# Patient Record
Sex: Female | Born: 1960
Health system: Southern US, Community
[De-identification: ages and names within clinical notes are randomized; demographics above are authoritative.]

## PROBLEM LIST (undated history)

## (undated) DIAGNOSIS — M1990 Unspecified osteoarthritis, unspecified site: Secondary | ICD-10-CM

## (undated) DIAGNOSIS — K219 Gastro-esophageal reflux disease without esophagitis: Secondary | ICD-10-CM

## (undated) DIAGNOSIS — Z87442 Personal history of urinary calculi: Secondary | ICD-10-CM

## (undated) DIAGNOSIS — K2210 Ulcer of esophagus without bleeding: Secondary | ICD-10-CM

## (undated) DIAGNOSIS — R519 Headache, unspecified: Secondary | ICD-10-CM

## (undated) DIAGNOSIS — K900 Celiac disease: Principal | ICD-10-CM

## (undated) HISTORY — PX: WISDOM TOOTH EXTRACTION: SHX21

## (undated) HISTORY — DX: Celiac disease: K90.0

## (undated) HISTORY — PX: TUBAL LIGATION: SHX77

---

## 2000-12-31 ENCOUNTER — Encounter (HOSPITAL_COMMUNITY): Admission: RE | Admit: 2000-12-31 | Discharge: 2001-01-30 | Payer: Self-pay | Admitting: Preventative Medicine

## 2001-02-26 ENCOUNTER — Ambulatory Visit (HOSPITAL_COMMUNITY): Admission: RE | Admit: 2001-02-26 | Discharge: 2001-02-26 | Payer: Self-pay | Admitting: Family Medicine

## 2001-02-26 ENCOUNTER — Encounter: Payer: Self-pay | Admitting: Family Medicine

## 2013-06-14 ENCOUNTER — Encounter (HOSPITAL_COMMUNITY)
Admission: EM | Disposition: A | Discharge status: Home / Self Care | Payer: Self-pay | Source: Home / Self Care | Attending: Family Medicine

## 2013-06-14 SURGERY — COLONOSCOPY, ESOPHAGOGASTRODUODENOSCOPY (EGD) AND ESOPHAGEAL DILATION (ED)
Anesthesia: Moderate Sedation

## 2013-07-02 ENCOUNTER — Ambulatory Visit (INDEPENDENT_AMBULATORY_CARE_PROVIDER_SITE_OTHER): Payer: No Typology Code available for payment source | Admitting: Family Medicine

## 2013-07-02 ENCOUNTER — Encounter: Payer: Self-pay | Admitting: Family Medicine

## 2013-07-02 VITALS — BP 138/90 | Ht 64.25 in | Wt 235.0 lb

## 2013-07-02 DIAGNOSIS — R079 Chest pain, unspecified: Secondary | ICD-10-CM

## 2013-07-02 DIAGNOSIS — Z205 Contact with and (suspected) exposure to viral hepatitis: Secondary | ICD-10-CM

## 2013-07-02 DIAGNOSIS — R5383 Other fatigue: Principal | ICD-10-CM

## 2013-07-02 DIAGNOSIS — Z0000 Encounter for general adult medical examination without abnormal findings: Secondary | ICD-10-CM

## 2013-07-02 DIAGNOSIS — K2100 Gastro-esophageal reflux disease with esophagitis, without bleeding: Secondary | ICD-10-CM

## 2013-07-02 DIAGNOSIS — Z20828 Contact with and (suspected) exposure to other viral communicable diseases: Secondary | ICD-10-CM

## 2013-07-02 DIAGNOSIS — R5381 Other malaise: Secondary | ICD-10-CM

## 2013-07-02 MED ORDER — PANTOPRAZOLE SODIUM 40 MG PO TBEC: 40.0000 mg | DELAYED_RELEASE_TABLET | Freq: Every morning | ORAL | Status: DC

## 2013-07-02 MED ORDER — SUCRALFATE 1 G PO TABS: ORAL_TABLET | ORAL | Status: DC

## 2013-07-02 NOTE — Progress Notes (Signed)
   Subjective:    Patient ID: Rhonda Harris, female    DOB: February 16, 1961, 53 y.o.   MRN: [id number]  HPIHaving pressure in middle of chest. Hurts to eat or drink. Takes antiacid meds.   For past two months, eating or dringking anything leads to chest discomfort  Substernal, last for a number om minutes  Bad pain, anacid tabs elp some  No vom No diarrhea  No energy, light headed when standing and then rising up suddenly  Exercise minimal  Works at [employer] active with this  No energy at all ,  Pain not associated with exertion  Patient's spouse has hepatitis C and the patient has not been tested for this.  She admits to not having seen a Dr. for nearly 10 years. Patient has not had a women's preventive physical for many years.      Dizziness in the am when standing.   Last prev p e,     Review of Systems  no weight loss gradual weight gain no significant headache no chest congestion no cough no change in bowel habits no blood in stool ROS otherwise negative    Objective:   Physical Exam Alert no apparent distress. Blood pressure 134/86 on repeat. HEENT normal. Lungs clear. Heart regular rate and rhythm. Epigastrium slight tenderness at most. No rebound no guarding no masses  Assessment #1 reflux with substernal discomfort immediately proceeding. #2 Gen. fatigue. #3 hepatitis C exposure with no known testing for this #4 patient behind on wellness recommendations plan initiate protonic some Carafate. Blood work is ordered. Followup in 2 weeks for reassessment. Strongly encourage preventive exam this spring      Assessmen assessment see above

## 2013-07-03 DIAGNOSIS — K2100 Gastro-esophageal reflux disease with esophagitis, without bleeding: Secondary | ICD-10-CM | POA: Insufficient documentation

## 2013-07-03 DIAGNOSIS — Z205 Contact with and (suspected) exposure to viral hepatitis: Secondary | ICD-10-CM | POA: Insufficient documentation

## 2013-07-06 ENCOUNTER — Encounter: Payer: Self-pay | Admitting: Family Medicine

## 2013-07-06 ENCOUNTER — Ambulatory Visit (INDEPENDENT_AMBULATORY_CARE_PROVIDER_SITE_OTHER): Payer: No Typology Code available for payment source | Admitting: Family Medicine

## 2013-07-06 VITALS — BP 118/70 | Temp 99.7°F | Ht 64.5 in | Wt 231.0 lb

## 2013-07-06 DIAGNOSIS — B349 Viral infection, unspecified: Secondary | ICD-10-CM

## 2013-07-06 DIAGNOSIS — B9789 Other viral agents as the cause of diseases classified elsewhere: Secondary | ICD-10-CM

## 2013-07-06 MED ORDER — ONDANSETRON 4 MG PO TBDP: 4.0000 mg | ORAL_TABLET | Freq: Three times a day (TID) | ORAL | Status: DC | PRN

## 2013-07-06 NOTE — Progress Notes (Signed)
   Subjective:    Patient ID: Rhonda Harris, female    DOB: [birth year], 53 y.o.   MRN: [id number]  Emesis  This is a new problem. The current episode started yesterday. Associated symptoms include diarrhea and a fever.   No cough Bad headache  Diminished energy and appetite  Drank gatorade and gingeral  High temp yest  Review of Systems  Constitutional: Positive for fever.  Gastrointestinal: Positive for vomiting and diarrhea.   No rash ROS otherwise negative    Objective:   Physical Exam Alert moderate malaise. Hydration good. HEENT normal. Neck supple lungs clear. Heart regular in rhythm. Abdomen hyperactive bowel sounds no discrete tenderness. Diffuse mild tenderness.       Assessment & Plan:  Impression viral gastroenteritis discussed plan Zofran when necessary. Symptomatic care discussed. Warning signs discussed. WSL

## 2013-07-10 ENCOUNTER — Telehealth: Payer: Self-pay | Admitting: Family Medicine

## 2013-07-10 ENCOUNTER — Inpatient Hospital Stay (HOSPITAL_COMMUNITY)
Admission: EM | Admit: 2013-07-10 | Discharge: 2013-07-13 | DRG: 812 | Disposition: A | Discharge status: Home / Self Care | Payer: No Typology Code available for payment source | Attending: Family Medicine | Admitting: Family Medicine

## 2013-07-10 ENCOUNTER — Emergency Department (HOSPITAL_COMMUNITY): Payer: No Typology Code available for payment source

## 2013-07-10 ENCOUNTER — Encounter (HOSPITAL_COMMUNITY): Payer: Self-pay | Admitting: Emergency Medicine

## 2013-07-10 DIAGNOSIS — K5730 Diverticulosis of large intestine without perforation or abscess without bleeding: Secondary | ICD-10-CM | POA: Diagnosis present

## 2013-07-10 DIAGNOSIS — R109 Unspecified abdominal pain: Secondary | ICD-10-CM | POA: Diagnosis present

## 2013-07-10 DIAGNOSIS — K2100 Gastro-esophageal reflux disease with esophagitis, without bleeding: Secondary | ICD-10-CM | POA: Diagnosis present

## 2013-07-10 DIAGNOSIS — R1310 Dysphagia, unspecified: Secondary | ICD-10-CM | POA: Diagnosis present

## 2013-07-10 DIAGNOSIS — Z87891 Personal history of nicotine dependence: Secondary | ICD-10-CM

## 2013-07-10 DIAGNOSIS — E876 Hypokalemia: Secondary | ICD-10-CM | POA: Diagnosis present

## 2013-07-10 DIAGNOSIS — D500 Iron deficiency anemia secondary to blood loss (chronic): Principal | ICD-10-CM | POA: Diagnosis present

## 2013-07-10 DIAGNOSIS — D649 Anemia, unspecified: Secondary | ICD-10-CM | POA: Diagnosis present

## 2013-07-10 DIAGNOSIS — D509 Iron deficiency anemia, unspecified: Secondary | ICD-10-CM | POA: Diagnosis present

## 2013-07-10 DIAGNOSIS — K449 Diaphragmatic hernia without obstruction or gangrene: Secondary | ICD-10-CM | POA: Diagnosis present

## 2013-07-10 DIAGNOSIS — K8020 Calculus of gallbladder without cholecystitis without obstruction: Secondary | ICD-10-CM | POA: Diagnosis present

## 2013-07-10 HISTORY — DX: Ulcer of esophagus without bleeding: K22.10

## 2013-07-10 HISTORY — DX: Gastro-esophageal reflux disease without esophagitis: K21.9

## 2013-07-10 LAB — HEPATIC FUNCTION PANEL
ALT: 12 U/L (ref 0–35)
AST: 20 U/L (ref 0–37)
Albumin: 3.6 g/dL (ref 3.5–5.2)
Alkaline Phosphatase: 49 U/L (ref 39–117)
Bilirubin, Direct: 0.1 mg/dL (ref 0.0–0.3)
Indirect Bilirubin: 0.3 mg/dL (ref 0.2–1.2)
Total Bilirubin: 0.4 mg/dL (ref 0.2–1.2)
Total Protein: 6.3 g/dL (ref 6.0–8.3)

## 2013-07-10 LAB — COMPREHENSIVE METABOLIC PANEL
ALBUMIN: 3.2 g/dL — AB (ref 3.5–5.2)
ALT: 14 U/L (ref 0–35)
AST: 21 U/L (ref 0–37)
Alkaline Phosphatase: 61 U/L (ref 39–117)
BUN: 10 mg/dL (ref 6–23)
CHLORIDE: 99 meq/L (ref 96–112)
CO2: 30 mEq/L (ref 19–32)
Calcium: 9.1 mg/dL (ref 8.4–10.5)
Creatinine, Ser: 0.74 mg/dL (ref 0.50–1.10)
GFR calc Af Amer: >90 mL/min (ref >90)
GFR calc non Af Amer: >90 mL/min (ref >90)
Glucose, Bld: 118 mg/dL — ABNORMAL HIGH (ref 70–99)
Potassium: 3.1 mEq/L — ABNORMAL LOW (ref 3.7–5.3)
Sodium: 139 mEq/L (ref 137–147)
Total Bilirubin: 0.2 mg/dL — ABNORMAL LOW (ref 0.3–1.2)
Total Protein: 7 g/dL (ref 6.0–8.3)

## 2013-07-10 LAB — BASIC METABOLIC PANEL
BUN: 11 mg/dL (ref 6–23)
CO2: 28 mEq/L (ref 19–32)
Calcium: 9 mg/dL (ref 8.4–10.5)
Chloride: 104 mEq/L (ref 96–112)
Creat: 0.65 mg/dL (ref 0.50–1.10)
Glucose, Bld: 99 mg/dL (ref 70–99)
Potassium: 4.1 mEq/L (ref 3.5–5.3)
Sodium: 140 mEq/L (ref 135–145)

## 2013-07-10 LAB — CBC WITH DIFFERENTIAL/PLATELET
BAND NEUTROPHILS: 0 % (ref 0–10)
BASOS PCT: 0 % (ref 0–1)
Basophils Absolute: 0.1 10*3/uL (ref 0.0–0.1)
Basophils Absolute: 0 10*3/uL (ref 0.0–0.1)
Basophils Relative: 1 % (ref 0–1)
Blasts: 0 %
Eosinophils Absolute: 0.1 10*3/uL (ref 0.0–0.7)
Eosinophils Absolute: 0 10*3/uL (ref 0.0–0.7)
Eosinophils Relative: 1 % (ref 0–5)
Eosinophils Relative: 0 % (ref 0–5)
HCT: 20.2 % — ABNORMAL LOW (ref 36.0–46.0)
HCT: 20.5 % — ABNORMAL LOW (ref 36.0–46.0)
Hemoglobin: 5.4 g/dL — CL (ref 12.0–15.0)
Hemoglobin: 5.5 g/dL — CL (ref 12.0–15.0)
Lymphocytes Relative: 35 % (ref 12–46)
Lymphocytes Relative: 45 % (ref 12–46)
Lymphs Abs: 2 10*3/uL (ref 0.7–4.0)
Lymphs Abs: 3 10*3/uL (ref 0.7–4.0)
MCH: 15.5 pg — ABNORMAL LOW (ref 26.0–34.0)
MCH: 16.3 pg — ABNORMAL LOW (ref 26.0–34.0)
MCHC: 26.7 g/dL — ABNORMAL LOW (ref 30.0–36.0)
MCHC: 26.8 g/dL — ABNORMAL LOW (ref 30.0–36.0)
MCV: 58 fL — ABNORMAL LOW (ref 78.0–100.0)
MCV: 60.8 fL — ABNORMAL LOW (ref 78.0–100.0)
MONO ABS: 0 10*3/uL — AB (ref 0.1–1.0)
Metamyelocytes Relative: 0 %
Monocytes Absolute: 0.5 10*3/uL (ref 0.1–1.0)
Monocytes Relative: 9 % (ref 3–12)
Monocytes Relative: 0 % — ABNORMAL LOW (ref 3–12)
Myelocytes: 0 %
Neutro Abs: 3 10*3/uL (ref 1.7–7.7)
Neutro Abs: 3.7 10*3/uL (ref 1.7–7.7)
Neutrophils Relative %: 54 % (ref 43–77)
Neutrophils Relative %: 55 % (ref 43–77)
Platelets: 337 10*3/uL (ref 150–400)
Platelets: 314 10*3/uL (ref 150–400)
Promyelocytes Absolute: 0 %
RBC: 3.48 MIL/uL — ABNORMAL LOW (ref 3.87–5.11)
RBC: 3.37 MIL/uL — ABNORMAL LOW (ref 3.87–5.11)
RDW: 19.4 % — ABNORMAL HIGH (ref 11.5–15.5)
RDW: 19.7 % — ABNORMAL HIGH (ref 11.5–15.5)
WBC: 5.6 10*3/uL (ref 4.0–10.5)
WBC: 6.7 10*3/uL (ref 4.0–10.5)
nRBC: 0 /100 WBC

## 2013-07-10 LAB — RETICULOCYTES
RBC.: 3.37 MIL/uL — AB (ref 3.87–5.11)
Retic Count, Absolute: 64 10*3/uL (ref 19.0–186.0)
Retic Ct Pct: 1.9 % (ref 0.4–3.1)

## 2013-07-10 LAB — LIPASE, BLOOD: Lipase: 68 U/L — ABNORMAL HIGH (ref 11–59)

## 2013-07-10 LAB — LIPID PANEL
Cholesterol: 141 mg/dL (ref 0–200)
HDL: 35 mg/dL — ABNORMAL LOW (ref >39)
LDL Cholesterol: 85 mg/dL (ref 0–99)
Total CHOL/HDL Ratio: 4 Ratio
Triglycerides: 107 mg/dL (ref <150)
VLDL: 21 mg/dL (ref 0–40)

## 2013-07-10 LAB — PREPARE RBC (CROSSMATCH)

## 2013-07-10 LAB — TSH: TSH: 0.536 u[IU]/mL (ref 0.350–4.500)

## 2013-07-10 LAB — PROTIME-INR
INR: 0.92 (ref 0.00–1.49)
PROTHROMBIN TIME: 12.2 s (ref 11.6–15.2)

## 2013-07-10 LAB — APTT: APTT: 24 s (ref 24–37)

## 2013-07-10 LAB — HEPATITIS C ANTIBODY: HCV Ab: NEGATIVE

## 2013-07-10 LAB — ABO/RH: ABO/RH(D): A POS

## 2013-07-10 MED ORDER — SODIUM CHLORIDE 0.9 % IV SOLN: Freq: Once | INTRAVENOUS | Status: DC

## 2013-07-10 NOTE — Telephone Encounter (Signed)
Received call from lab of hgb 5.4. Reviewed chart and recent notes. Called pt. She confirmed GI sx noted in chart which have recently been getting worse (pain in chest, hurts to eat/drink, pain with swallowing). She also has been feeling weak and lightheaded on standing, also worsening recently. Explained I'm concerned about internal bleeding and strongly advised her to go to the ED immediately. Pt said she would go to [facility]. Called ED to let them know abt pt and spoke with charge nurse.

## 2013-07-10 NOTE — H&P (Signed)
[facility] History and Physical  Rhonda Harris YBO:[id number] DOB: 1960/11/07 DOA: 07/10/2013   PCP: Rubbie Battiest, MD  Specialists: None  Chief Complaint: Low hemoglobin  HPI: Rhonda Harris is a 53 y.o. female with no significant past medical history, who, over the past 1-2 months, has been noticing increasing fatigue. Shortness of breath with activity. Some episodes of dizziness at times without any syncopal episode. She has noticed some black stools on and off. Although she has taken Pepto-Bismol. She's had some chest pain on and off which was thought to be secondary to acid reflux. Denies any chest pain currently. She's had some difficulty swallowing. She has had acid reflux and regurgitation. Has lost about 5-6 pounds over the last couple of weeks. And has been having abdominal pain on and off for the last month and a half as well. Denies any vaginal bleeding or discharge. She went to her primary care physician's office last Friday with the symptoms. She was prescribed antacids, PPI and was asked to get blood work done. On Monday she developed gastroenteritis, which resolved and then she went for blood work today. She was found to have a hemoglobin of 5 and was called in to come to the emergency department. Denies any blood in the stools. Recently, urine at any time. Denies colonoscopies. During her visit with her primary care physician. She also raised concern about possible exposure to hepatitis C from spouse.  Home Medications: Prior to Admission medications   Medication Sig Start Date End Date Taking? Authorizing Provider  ondansetron (ZOFRAN ODT) 4 MG disintegrating tablet Take 1 tablet (4 mg total) by mouth every 8 (eight) hours as needed for nausea or vomiting. 07/06/13  Yes Mikey Kirschner, MD  pantoprazole (PROTONIX) 40 MG tablet Take 1 tablet (40 mg total) by mouth every morning. 07/02/13  Yes Mikey Kirschner, MD  sucralfate (CARAFATE) 1 G tablet Take 1 g by mouth 4 (four)  times daily -  with meals and at bedtime.   Yes Historical Provider, MD    Allergies: No Known Allergies  Past Medical History: Past Medical History  Diagnosis Date  . Esophageal erosions     History reviewed. No pertinent past surgical history.  Social History: She lives in [place] with her husband. She works in the [employer]. No smoking, alcohol or illicit drug use. Usually independent with daily activities.  Family History: Denies any health problems in the family  Review of Systems - History obtained from the patient General ROS: positive for  - fatigue Psychological ROS: negative Ophthalmic ROS: negative ENT ROS: negative Allergy and Immunology ROS: negative Hematological and Lymphatic ROS: negative Endocrine ROS: negative Respiratory ROS: as in hpi Cardiovascular ROS: as in hpi Gastrointestinal ROS: no abdominal pain, change in bowel habits, or black or bloody stools Genito-Urinary ROS: no dysuria, trouble voiding, or hematuria Musculoskeletal ROS: negative Neurological ROS: no TIA or stroke symptoms Dermatological ROS: negative  Physical Examination  Filed Vitals:   07/10/13 2050  BP: 152/73  Pulse: 95  Temp: 98.6 F (37 C)  TempSrc: Oral  Resp: 16  Height: 5' 5"  (1.651 m)  Weight: 104.327 kg (230 lb)  SpO2: 100%    BP 152/73  Pulse 95  Temp(Src) 98.6 F (37 C) (Oral)  Resp 16  Ht 5' 5"  (1.651 m)  Wt 104.327 kg (230 lb)  BMI 38.27 kg/m2  SpO2 100%  General appearance: alert, cooperative, appears stated age, no distress and moderately obese Head: Normocephalic, without  obvious abnormality, atraumatic Eyes: conjunctivae/corneas clear. PERRL, EOM's intact. Throat: lips, mucosa, and tongue normal; teeth and gums normal Neck: no adenopathy, no carotid bruit, no JVD, supple, symmetrical, trachea midline and thyroid not enlarged, symmetric, no tenderness/mass/nodules Resp: clear to auscultation bilaterally Cardio: S1, S2 normal, no S3 or S4,  systolic murmur: early systolic 2/6, crescendo throughout the precordium, no click and no rub GI: Abdomen is soft. There is tenderness diffusely mostly in the upper abdomen without any rebound, rigidity, or guarding. No masses, organomegaly. Bowel sounds are present. Extremities: extremities normal, atraumatic, no cyanosis or edema Pulses: 2+ and symmetric Skin: Skin color, texture, turgor normal. No rashes or lesions Lymph nodes: Cervical, supraclavicular, and axillary nodes normal. Neurologic: Alert and oriented x3. No focal neurological deficits are present.  Laboratory Data: Results for orders placed during the hospital encounter of 07/10/13 (from the past 48 hour(s))  ABO/RH     Status: None   Collection Time    07/10/13  9:30 PM      Result Value Ref Range   ABO/RH(D) A POS    CBC WITH DIFFERENTIAL     Status: Abnormal   Collection Time    07/10/13  9:35 PM      Result Value Ref Range   WBC 6.7  4.0 - 10.5 K/uL   RBC 3.37 (*) 3.87 - 5.11 MIL/uL   Hemoglobin 5.5 (*) 12.0 - 15.0 g/dL   Comment: CRITICAL RESULT CALLED TO, READ BACK BY AND VERIFIED WITH:     G.PRUITT AT 2202 ON 07/10/13 BY S.VANHOORNE     RESULT REPEATED AND VERIFIED   HCT 20.5 (*) 36.0 - 46.0 %   MCV 60.8 (*) 78.0 - 100.0 fL   MCH 16.3 (*) 26.0 - 34.0 pg   MCHC 26.8 (*) 30.0 - 36.0 g/dL   RDW 19.7 (*) 11.5 - 15.5 %   Platelets 314  150 - 400 K/uL   Neutrophils Relative % 55  43 - 77 %   Lymphocytes Relative 45  12 - 46 %   Monocytes Relative 0 (*) 3 - 12 %   Eosinophils Relative 0  0 - 5 %   Basophils Relative 0  0 - 1 %   Band Neutrophils 0  0 - 10 %   Metamyelocytes Relative 0     Myelocytes 0     Promyelocytes Absolute 0     Blasts 0     nRBC 0  0 /100 WBC   Neutro Abs 3.7  1.7 - 7.7 K/uL   Lymphs Abs 3.0  0.7 - 4.0 K/uL   Monocytes Absolute 0.0 (*) 0.1 - 1.0 K/uL   Eosinophils Absolute 0.0  0.0 - 0.7 K/uL   Basophils Absolute 0.0  0.0 - 0.1 K/uL  COMPREHENSIVE METABOLIC PANEL     Status: Abnormal     Collection Time    07/10/13  9:35 PM      Result Value Ref Range   Sodium 139  137 - 147 mEq/L   Potassium 3.1 (*) 3.7 - 5.3 mEq/L   Chloride 99  96 - 112 mEq/L   CO2 30  19 - 32 mEq/L   Glucose, Bld 118 (*) 70 - 99 mg/dL   BUN 10  6 - 23 mg/dL   Creatinine, Ser 0.74  0.50 - 1.10 mg/dL   Calcium 9.1  8.4 - 10.5 mg/dL   Total Protein 7.0  6.0 - 8.3 g/dL   Albumin 3.2 (*) 3.5 - 5.2 g/dL  AST 21  0 - 37 U/L   ALT 14  0 - 35 U/L   Alkaline Phosphatase 61  39 - 117 U/L   Total Bilirubin 0.2 (*) 0.3 - 1.2 mg/dL   GFR calc non Af Amer >90  >90 mL/min   GFR calc Af Amer >90  >90 mL/min   Comment: (NOTE)     The eGFR has been calculated using the CKD EPI equation.     This calculation has not been validated in all clinical situations.     eGFR's persistently <90 mL/min signify possible Chronic Kidney     Disease.  TYPE AND SCREEN     Status: None   Collection Time    07/10/13  9:35 PM      Result Value Ref Range   ABO/RH(D) A POS     Antibody Screen NEG     Sample Expiration 07/13/2013     Unit Number [id number]     Blood Component Type RED CELLS,LR     Unit division 00     Status of Unit ALLOCATED     Transfusion Status OK TO TRANSFUSE     Crossmatch Result Compatible     Unit Number [id number]     Blood Component Type RED CELLS,LR     Unit division 00     Status of Unit ALLOCATED     Transfusion Status OK TO TRANSFUSE     Crossmatch Result Compatible    PROTIME-INR     Status: None   Collection Time    07/10/13  9:35 PM      Result Value Ref Range   Prothrombin Time 12.2  11.6 - 15.2 seconds   INR 0.92  0.00 - 1.49  APTT     Status: None   Collection Time    07/10/13  9:35 PM      Result Value Ref Range   aPTT 24  24 - 37 seconds  PREPARE RBC (CROSSMATCH)     Status: None   Collection Time    07/10/13  9:35 PM      Result Value Ref Range   Order Confirmation ORDER PROCESSED BY BLOOD BANK    RETICULOCYTES     Status: Abnormal   Collection Time     07/10/13  9:35 PM      Result Value Ref Range   Retic Ct Pct 1.9  0.4 - 3.1 %   RBC. 3.37 (*) 3.87 - 5.11 MIL/uL   Retic Count, Manual 64.0  19.0 - 186.0 K/uL  LIPASE, BLOOD     Status: Abnormal   Collection Time    07/10/13  9:35 PM      Result Value Ref Range   Lipase 68 (*) 11 - 59 U/L    Radiology Reports: No results found.  Electrocardiogram: Pending  Problem List  Principal Problem:   Microcytic anemia Active Problems:   Reflux esophagitis   Dysphagia   Abdominal pain   Assessment: This is a 53 year old, Caucasian female, does not have any significant medical problems, who presents with one to 2 month history of worsening dyspnea, chest pains, and acid reflux. She's found to have severe anemia. Hemoccult testing in the ED, was negative. But she does report black stools occasionally. It is very likely that her anemia is secondary to GI blood loss.  Plan: #1 microcytic anemia, possibly secondary to GI blood loss: She'll be transfused 2 units of blood. Hemoglobin will be repeated subsequently. Anemia panel has been sent  off and is pending. She does not report any vaginal bleeding.  #2 symptoms of dysphagia and acid reflux: It's quite likely patient has peptic ulcer disease. We will give her PPI. [facility] consult gastroenterology. Since she also has upper abdominal pain and tenderness. We will proceed with lipase levels, as well as a CT scan of the abdomen and pelvis. LFTs are normal. She will need upper endoscopy and will be kept NPO for now.  #3 hypokalemia: This will be repleted.  DVT Prophylaxis: SCDs Code Status: Full code Family Communication: Discussed with the patient and her husband  Disposition Plan: Admit to telemetry   Further management decisions will depend on results of further testing and patient's response to treatment.  [facility]  [facility] Pager [phone]  If 7PM-7AM, please contact night-coverage [email or web address] Password  [facility]  07/10/2013, 11:19 PM

## 2013-07-10 NOTE — ED Notes (Signed)
POC OCCULT BLOOD NEGATIVE

## 2013-07-10 NOTE — ED Notes (Addendum)
Had labwork drawn this morning and notified by Dr. Kathie Rhodes. [facility]'s office of low Hgb of 5.  Has noticed stools are tarry looking. Had stomach bug earlier part of week, did not notice hematemesis.

## 2013-07-11 ENCOUNTER — Encounter (HOSPITAL_COMMUNITY): Payer: Self-pay | Admitting: *Deleted

## 2013-07-11 DIAGNOSIS — K900 Celiac disease: Secondary | ICD-10-CM

## 2013-07-11 DIAGNOSIS — D649 Anemia, unspecified: Secondary | ICD-10-CM | POA: Diagnosis present

## 2013-07-11 DIAGNOSIS — D509 Iron deficiency anemia, unspecified: Secondary | ICD-10-CM

## 2013-07-11 DIAGNOSIS — R1310 Dysphagia, unspecified: Secondary | ICD-10-CM

## 2013-07-11 HISTORY — DX: Celiac disease: K90.0

## 2013-07-11 LAB — IRON AND TIBC
Iron: 13 ug/dL — ABNORMAL LOW (ref 42–135)
Saturation Ratios: 3 % — ABNORMAL LOW (ref 20–55)
TIBC: 487 ug/dL — ABNORMAL HIGH (ref 250–470)
UIBC: 474 ug/dL — AB (ref 125–400)

## 2013-07-11 LAB — COMPREHENSIVE METABOLIC PANEL
ALT: 13 U/L (ref 0–35)
AST: 19 U/L (ref 0–37)
Albumin: 3 g/dL — ABNORMAL LOW (ref 3.5–5.2)
Alkaline Phosphatase: 58 U/L (ref 39–117)
BILIRUBIN TOTAL: 0.5 mg/dL (ref 0.3–1.2)
BUN: 8 mg/dL (ref 6–23)
CALCIUM: 8.8 mg/dL (ref 8.4–10.5)
CO2: 27 mEq/L (ref 19–32)
CREATININE: 0.65 mg/dL (ref 0.50–1.10)
Chloride: 102 mEq/L (ref 96–112)
GFR calc Af Amer: >90 mL/min (ref >90)
GFR calc non Af Amer: >90 mL/min (ref >90)
Glucose, Bld: 98 mg/dL (ref 70–99)
Potassium: 4 mEq/L (ref 3.7–5.3)
Sodium: 138 mEq/L (ref 137–147)
Total Protein: 6.6 g/dL (ref 6.0–8.3)

## 2013-07-11 LAB — CBC
HEMATOCRIT: 25 % — AB (ref 36.0–46.0)
Hemoglobin: 7.4 g/dL — ABNORMAL LOW (ref 12.0–15.0)
MCH: 19.2 pg — ABNORMAL LOW (ref 26.0–34.0)
MCHC: 29.6 g/dL — ABNORMAL LOW (ref 30.0–36.0)
MCV: 64.9 fL — AB (ref 78.0–100.0)
PLATELETS: 254 10*3/uL (ref 150–400)
RBC: 3.85 MIL/uL — AB (ref 3.87–5.11)
RDW: 24.1 % — ABNORMAL HIGH (ref 11.5–15.5)
WBC: 6.5 10*3/uL (ref 4.0–10.5)

## 2013-07-11 LAB — VITAMIN B12: Vitamin B-12: 333 pg/mL (ref 211–911)

## 2013-07-11 LAB — FERRITIN: FERRITIN: 6 ng/mL — AB (ref 10–291)

## 2013-07-11 LAB — FOLATE: Folate: 12.2 ng/mL

## 2013-07-11 LAB — PREPARE RBC (CROSSMATCH)

## 2013-07-11 MED ORDER — IOHEXOL 300 MG/ML  SOLN
50.0000 mL | Freq: Once | INTRAMUSCULAR | Status: AC | PRN
  Administered 2013-07-11: 50 mL via ORAL

## 2013-07-11 MED ORDER — ACETAMINOPHEN 325 MG PO TABS
650.0000 mg | ORAL_TABLET | Freq: Four times a day (QID) | ORAL | Status: DC | PRN
  Administered 2013-07-11 – 2013-07-12 (×2): 650 mg via ORAL
  Filled 2013-07-11 (×2): qty 2

## 2013-07-11 MED ORDER — SODIUM CHLORIDE 0.9 % IV SOLN: INTRAVENOUS | Status: DC

## 2013-07-11 MED ORDER — POTASSIUM CHLORIDE 10 MEQ/100ML IV SOLN
10.0000 meq | INTRAVENOUS | Status: AC
  Administered 2013-07-11 (×4): 10 meq via INTRAVENOUS
  Filled 2013-07-11 (×4): qty 100

## 2013-07-11 MED ORDER — ONDANSETRON HCL 4 MG PO TABS: 4.0000 mg | ORAL_TABLET | Freq: Four times a day (QID) | ORAL | Status: DC | PRN

## 2013-07-11 MED ORDER — IOHEXOL 300 MG/ML  SOLN
100.0000 mL | Freq: Once | INTRAMUSCULAR | Status: AC | PRN
  Administered 2013-07-11: 100 mL via INTRAVENOUS

## 2013-07-11 MED ORDER — ALBUTEROL SULFATE (2.5 MG/3ML) 0.083% IN NEBU: 2.5000 mg | INHALATION_SOLUTION | RESPIRATORY_TRACT | Status: DC | PRN

## 2013-07-11 MED ORDER — ACETAMINOPHEN 650 MG RE SUPP: 650.0000 mg | Freq: Four times a day (QID) | RECTAL | Status: DC | PRN

## 2013-07-11 MED ORDER — SODIUM CHLORIDE 0.9 % IV SOLN
INTRAVENOUS | Status: DC
  Administered 2013-07-11 (×2): via INTRAVENOUS

## 2013-07-11 MED ORDER — ONDANSETRON HCL 4 MG/2ML IJ SOLN
4.0000 mg | Freq: Four times a day (QID) | INTRAMUSCULAR | Status: DC | PRN
Start: 2013-07-11 — End: 2013-07-13

## 2013-07-11 MED ORDER — PANTOPRAZOLE SODIUM 40 MG IV SOLR
40.0000 mg | Freq: Two times a day (BID) | INTRAVENOUS | Status: DC
  Administered 2013-07-11 – 2013-07-12 (×3): 40 mg via INTRAVENOUS
  Filled 2013-07-11 (×3): qty 40

## 2013-07-11 MED ORDER — SODIUM CHLORIDE 0.9 % IJ SOLN
3.0000 mL | Freq: Two times a day (BID) | INTRAMUSCULAR | Status: DC
  Administered 2013-07-11 – 2013-07-13 (×5): 3 mL via INTRAVENOUS

## 2013-07-11 NOTE — Telephone Encounter (Signed)
Thanks for heads-up and jumping on that!

## 2013-07-11 NOTE — Progress Notes (Signed)
Utilization review Completed Sukhdeep Wieting RN BSN   

## 2013-07-11 NOTE — Consult Note (Signed)
Referring Provider: No ref. provider found Primary Care Physician:  Harlow Asa, MD Primary Gastroenterologist:  Dr.Merel Santoli  Reason for Consultation:  Profound microcytic anemia requiring transfusion  HPI:   Pleasant 53 year old lady admitted to the hospital last evening after being found to have a profound microcytic anemia with a hemoglobin in the 5 range as an outpatient. Patient saw a physician for the first time in 12 years (Dr. Gerda Diss) last week. Complaining of progressive weakness over the past couple of weeks. Transient nausea, vomiting and nonbloody diarrhea the first of the week-felt to have had a viral syndrome. Also, was experiencing some vague chest discomfort with swallowing, actually some pain with swallowing. Denies dysphagia. Those symptoms have resolved (given Carafate and Protonix.). She was Hemoccult negative in the ED. She's been hemodynamically stable. She has not had any hematemesis, melena or hematochezia. She has received 2 units of packed blood cells overnight;  Follow up hemoglobin 7.4 this morning. Iron studies are pending. CT demonstrates hiatal hernia with some food debris in the hernia sac,  a single gallstone without evidence of cholecystitis and no other acute findings. Lipase minimally elevated at 68-pancreas appears normal on cross-sectional imaging. She has no abdominal pain this morning. No prior history of gastrointestinal illness. Specifically, she denies a history of peptic ulcer disease. No prior GI evaluation.  No prior EGD or colonoscopy. Patient denies alcohol or NSAID use. No family history of any GI malignancy.  Past Medical History  Diagnosis Date  . Esophageal erosions     History reviewed. No pertinent past surgical history.  Prior to Admission medications   Medication Sig Start Date End Date Taking? Authorizing Provider  ondansetron (ZOFRAN ODT) 4 MG disintegrating tablet Take 1 tablet (4 mg total) by mouth every 8 (eight) hours as needed for nausea or  vomiting. 07/06/13  Yes Merlyn Albert, MD  pantoprazole (PROTONIX) 40 MG tablet Take 1 tablet (40 mg total) by mouth every morning. 07/02/13  Yes Merlyn Albert, MD  sucralfate (CARAFATE) 1 G tablet Take 1 g by mouth 4 (four) times daily -  with meals and at bedtime.   Yes Historical Provider, MD    Current Facility-Administered Medications  Medication Dose Route Frequency Provider Last Rate Last Dose  . 0.9 %  sodium chloride infusion   Intravenous Continuous Osvaldo Shipper, MD 50 mL/hr at 07/11/13 [phone]    . 0.9 %  sodium chloride infusion   Intravenous Continuous Corbin Ade, MD      . acetaminophen (TYLENOL) tablet 650 mg  650 mg Oral Q6H PRN Osvaldo Shipper, MD       Or  . acetaminophen (TYLENOL) suppository 650 mg  650 mg Rectal Q6H PRN Osvaldo Shipper, MD      . albuterol (PROVENTIL) (2.5 MG/3ML) 0.083% nebulizer solution 2.5 mg  2.5 mg Nebulization Q2H PRN Osvaldo Shipper, MD      . ondansetron (ZOFRAN) tablet 4 mg  4 mg Oral Q6H PRN Osvaldo Shipper, MD       Or  . ondansetron (ZOFRAN) injection 4 mg  4 mg Intravenous Q6H PRN Osvaldo Shipper, MD      . pantoprazole (PROTONIX) injection 40 mg  40 mg Intravenous Q12H Osvaldo Shipper, MD   40 mg at 07/11/13 0909  . sodium chloride 0.9 % injection 3 mL  3 mL Intravenous Q12H Osvaldo Shipper, MD   3 mL at 07/11/13 0909    Allergies as of 07/10/2013  . (No Known Allergies)    History reviewed.  No pertinent family history.  History   Social History  . Marital Status: Single    Spouse Name: N/A    Number of Children: N/A  . Years of Education: N/A   Occupational History  . Not on file.   Social History Main Topics  . Smoking status: Former Games developer  . Smokeless tobacco: Former Neurosurgeon    Quit date: 07/02/1993  . Alcohol Use: No  . Drug Use: No  . Sexual Activity: Not on file   Other Topics Concern  . Not on file   Social History Narrative  . No narrative on file    Review of Systems: Gen: Denies any fever, chills,  sweats, anorexia,  weight loss, or sleep disorder CV: , angina, palpitations, syncope, orthopnea, PND, peripheral edema, and claudication. Resp: Denies  cough, sputum, wheezing, coughing up blood, and pleurisy. GI: Denies vomiting blood, jaundice, and fecal incontinence.   Derm: Denies rash, itching, dry skin, hives, moles, warts, or unhealing ulcers.    Physical Exam: Vital signs in last 24 hours: Temp:  [97.5 F (36.4 C)-98.6 F (37 C)] 97.9 F (36.6 C) (03/01 0635) Pulse Rate:  [77-95] 78 (03/01 0635) Resp:  [16-20] 20 (03/01 0635) BP: (108-152)/(63-83) 130/83 mmHg (03/01 0635) SpO2:  [96 %-100 %] 98 % (03/01 0635) Weight:  [230 lb (104.327 kg)-233 lb 14.4 oz (106.096 kg)] 233 lb 14.4 oz (106.096 kg) (03/01 0256) Last BM Date: 07/11/13 General:   Alert,  Well-developed, pleasant and cooperative in NAD;  accompanied  her sister Head:  Normocephalic and atraumatic. Eyes:  Sclera clear, no icterus.   Conjunctiva pale Nose:  No deformity, discharge,  or lesions. Mouth:  No deformity or lesions, dentition normal. Neck:  Supple; no masses or thyromegaly. Lungs:  Clear throughout to auscultation.   No wheezes, crackles, or rhonchi. No acute distress. Heart:  Regular rate and rhythm; no murmurs, clicks, rubs,  or gallops. Abdomen:  Nondistended. Positive bowel sounds. Soft and nontender without appreciable mass or organomegaly Rectal:  Deferred until time of colonoscopy.   Msk:  Symmetrical without gross deformities. Normal posture. Pulses:  Normal pulses noted. Extremities:  Without clubbing or edema. Neurologic:  Alert and  oriented x4;  grossly normal neurologically.  Intake/Output from previous day: 02/28 0701 - 03/01 0700 In: 1689.3 [Blood:1689.3] Out: -  Intake/Output this shift:    Lab Results:  Recent Labs  07/10/13 0919 07/10/13 2135 07/11/13 0739  WBC 5.6 6.7 6.5  HGB 5.4* 5.5* 7.4*  HCT 20.2* 20.5* 25.0*  PLT 337 314 254   BMET  Recent Labs   07/10/13 0919 07/10/13 2135 07/11/13 0739  NA 140 139 138  K 4.1 3.1* 4.0  CL 104 99 102  CO2 28 30 27   GLUCOSE 99 118* 98  BUN 11 10 8   CREATININE 0.65 0.74 1.88  CALCIUM 9.0 9.1 8.8   LFT  Recent Labs  07/10/13 0919  07/11/13 0739  PROT 6.3  < > 6.6  ALBUMIN 3.6  < > 3.0*  AST 20  < > 19  ALT 12  < > 13  ALKPHOS 49  < > 58  BILITOT 0.4  < > 0.5  BILIDIR 0.1  --   --   IBILI 0.3  --   --   < > = values in this interval not displayed. PT/INR  Recent Labs  07/10/13 2135  LABPROT 12.2  INR 0.92   Hepatitis Panel  Recent Labs  07/10/13 0919  HCVAB NEGATIVE  Studies/Results: Ct Abdomen Pelvis W Contrast  07/11/2013   CLINICAL DATA:  Abdominal pain, anemia  EXAM: CT ABDOMEN AND PELVIS WITH CONTRAST  TECHNIQUE: Multidetector CT imaging of the abdomen and pelvis was performed using the standard protocol following bolus administration of intravenous contrast.  CONTRAST:  OMNIPAQUE IOHEXOL 300 MG/ML  SOLN  COMPARISON:  None.  FINDINGS: Lung bases are clear.  Moderate hiatal hernia with fluid/debris.  Liver, spleen, pancreas, and adrenal glands are within normal limits.  Gallbladder is decompressed with a 12 mm gallstone (series 2/ image 29). No associated inflammatory changes by CT. No intrahepatic or extrahepatic ductal dilatation.  Kidneys are unremarkable.  No hydronephrosis.  No evidence of bowel obstruction.  Normal appendix.  No evidence of abdominal aortic aneurysm.  No abdominopelvic ascites.  No suspicious abdominopelvic lymphadenopathy.  Uterus and bilateral ovaries are unremarkable.  Bladder is within normal limits.  Degenerative changes of the visualized thoracolumbar spine.  IMPRESSION: No evidence of bowel obstruction.  Normal appendix.  Cholelithiasis, without associated inflammatory changes by CT.  Moderate hiatal hernia with fluid/debris.   Electronically Signed   By: Charline Bills M.D.   On: 07/11/2013 00:44    Impression:   Pleasant 52 year old lady  admitted to the hospital with a profound microcytic anemia requiring transfusion. Had transient nausea, vomiting diarrhea 1 week ago which was not most likely a self-limiting, separate process. Interestingly, had transient odynophagia which has resolved with a course of Carafate and Protonix. She really denies any chronic GI symptoms. She is Hemoccult negative on admission.  Iron studies are pending. Although no bleeding documented, need to consider occult lesions in her upper and lower GI tract which could produce slow GI bleeding. Poor iron absorption or primary hematological process also remains in the differential. Had minimally elevated lipase-I doubt pancreatitis. Cholelithiasis, appears asymptomatic.  Recommendations:  Follow hemoglobin. She may need an additional unit or 2 of packed red blood cells. I have offered the patient both a diagnostic EGD and colonoscopy tomorrow.The risks, benefits, limitations, imponderables and alternatives regarding both EGD and colonoscopy have been reviewed with the patient. Questions have been answered. All parties agreeable.  Review and studies as they become available. Further recommendations to follow.

## 2013-07-11 NOTE — Progress Notes (Signed)
[facility] PROGRESS NOTE  Rhonda Harris ZOX:[id number] DOB: 03-10-1961 DOA: 07/10/2013 PCP: Rhonda Asa, MD  Assessment/Plan: 1-Severe anemia;  microcytic anemia, possibly secondary to GI blood loss: Patient S/P  2 units of blood transfusion.  GI consulted. Probably need endoscopy/colonoscopy. -Hb increase to 7 from 5 on admission.  -Anemia panel pending.   2 Odynophagia, abdominal pain;  Continue with  PPI.  Gastroenterology consulted. CT scan of the abdomen and pelvis showed: No evidence of bowel obstruction. Normal appendix. Cholelithiasis, without associated inflammatory changes by CT. Moderate hiatal hernia with fluid/debris. -LFTs are normal.  -Lipase mildly elevated. Repeat in am.   3 hypokalemia: resolved.  4-Diarrhea; improved. Probably gastroenteritis.   Code Status: full Code.  Family Communication; care discussed with patient.  Disposition Plan: remain inpatient.    Consultants:  GI.   Procedures:  none  Antibiotics:  none  HPI/Subjective: Had 2 loose BM today. Relates pain on swallowing.  No blood in the stool today.   Objective: Filed Vitals:   07/11/13 0635  BP: 130/83  Pulse: 78  Temp: 97.9 F (36.6 C)  Resp: 20    Intake/Output Summary (Last 24 hours) at 07/11/13 1118 Last data filed at 07/11/13 0635  Gross per 24 hour  Intake 1689.33 ml  Output      0 ml  Net 1689.33 ml   Filed Weights   07/10/13 2050 07/11/13 0256  Weight: 104.327 kg (230 lb) 106.096 kg (233 lb 14.4 oz)    Exam:   General:  No distress.   Cardiovascular: S 1, S 2 RRR  Respiratory: CTA  Abdomen: Bs present, soft, NT  Musculoskeletal: no edema.   Data Reviewed: Basic Metabolic Panel:  Recent Labs Lab 07/10/13 0919 07/10/13 2135 07/11/13 0739  NA 140 139 138  K 4.1 3.1* 4.0  CL 104 99 102  CO2 28 30 27   GLUCOSE 99 118* 98  BUN 11 10 8   CREATININE 0.65 0.74 0.65  CALCIUM 9.0 9.1 8.8   Liver Function Tests:  Recent Labs Lab 07/10/13 0919  07/10/13 2135 07/11/13 0739  AST 20 21 19   ALT 12 14 13   ALKPHOS 49 61 58  BILITOT 0.4 0.2* 0.5  PROT 6.3 7.0 6.6  ALBUMIN 3.6 3.2* 3.0*    Recent Labs Lab 07/10/13 2135  LIPASE 68*   No results found for this basename: AMMONIA,  in the last 168 hours CBC:  Recent Labs Lab 07/10/13 0919 07/10/13 2135 07/11/13 0739  WBC 5.6 6.7 6.5  NEUTROABS 3.0 3.7  --   HGB 5.4* 5.5* 7.4*  HCT 20.2* 20.5* 25.0*  MCV 58.0* 60.8* 64.9*  PLT 337 314 254   Cardiac Enzymes: No results found for this basename: CKTOTAL, CKMB, CKMBINDEX, TROPONINI,  in the last 168 hours BNP (last 3 results) No results found for this basename: PROBNP,  in the last 8760 hours CBG: No results found for this basename: GLUCAP,  in the last 168 hours  No results found for this or any previous visit (from the past 240 hour(s)).   Studies: Ct Abdomen Pelvis W Contrast  07/11/2013   CLINICAL DATA:  Abdominal pain, anemia  EXAM: CT ABDOMEN AND PELVIS WITH CONTRAST  TECHNIQUE: Multidetector CT imaging of the abdomen and pelvis was performed using the standard protocol following bolus administration of intravenous contrast.  CONTRAST:  OMNIPAQUE IOHEXOL 300 MG/ML  SOLN  COMPARISON:  None.  FINDINGS: Lung bases are clear.  Moderate hiatal hernia with fluid/debris.  Liver, spleen, pancreas,  and adrenal glands are within normal limits.  Gallbladder is decompressed with a 12 mm gallstone (series 2/ image 29). No associated inflammatory changes by CT. No intrahepatic or extrahepatic ductal dilatation.  Kidneys are unremarkable.  No hydronephrosis.  No evidence of bowel obstruction.  Normal appendix.  No evidence of abdominal aortic aneurysm.  No abdominopelvic ascites.  No suspicious abdominopelvic lymphadenopathy.  Uterus and bilateral ovaries are unremarkable.  Bladder is within normal limits.  Degenerative changes of the visualized thoracolumbar spine.  IMPRESSION: No evidence of bowel obstruction.  Normal appendix.   Cholelithiasis, without associated inflammatory changes by CT.  Moderate hiatal hernia with fluid/debris.   Electronically Signed   By: Rhonda Harris M.D.   On: 07/11/2013 00:44    Scheduled Meds: . pantoprazole (PROTONIX) IV  40 mg Intravenous Q12H  . sodium chloride  3 mL Intravenous Q12H   Continuous Infusions: . sodium chloride 50 mL/hr at 07/11/13 0337  . sodium chloride      Principal Problem:   Microcytic anemia Active Problems:   Reflux esophagitis   Dysphagia   Abdominal pain   Anemia    Time spent: 35 minutes.     Rhonda Harris  [facility] Pager [phone]. If 7PM-7AM, please contact night-coverage at [email or web address], password [facility] 07/11/2013, 11:18 AM  LOS: 1 day

## 2013-07-12 ENCOUNTER — Encounter (HOSPITAL_COMMUNITY): Payer: Self-pay | Admitting: *Deleted

## 2013-07-12 ENCOUNTER — Encounter (HOSPITAL_COMMUNITY)
Admission: EM | Disposition: A | Discharge status: Home / Self Care | Payer: Self-pay | Source: Home / Self Care | Attending: Family Medicine

## 2013-07-12 DIAGNOSIS — K289 Gastrojejunal ulcer, unspecified as acute or chronic, without hemorrhage or perforation: Secondary | ICD-10-CM

## 2013-07-12 DIAGNOSIS — K6389 Other specified diseases of intestine: Secondary | ICD-10-CM

## 2013-07-12 DIAGNOSIS — D509 Iron deficiency anemia, unspecified: Secondary | ICD-10-CM

## 2013-07-12 HISTORY — PX: COLONOSCOPY WITH ESOPHAGOGASTRODUODENOSCOPY (EGD): SHX5779

## 2013-07-12 LAB — BASIC METABOLIC PANEL
BUN: 8 mg/dL (ref 6–23)
CALCIUM: 9.2 mg/dL (ref 8.4–10.5)
CO2: 27 mEq/L (ref 19–32)
Chloride: 105 mEq/L (ref 96–112)
Creatinine, Ser: 0.66 mg/dL (ref 0.50–1.10)
Glucose, Bld: 87 mg/dL (ref 70–99)
POTASSIUM: 3.9 meq/L (ref 3.7–5.3)
SODIUM: 141 meq/L (ref 137–147)

## 2013-07-12 LAB — CBC
HCT: 29.5 % — ABNORMAL LOW (ref 36.0–46.0)
Hemoglobin: 9.1 g/dL — ABNORMAL LOW (ref 12.0–15.0)
MCH: 20.6 pg — AB (ref 26.0–34.0)
MCHC: 30.8 g/dL (ref 30.0–36.0)
MCV: 66.7 fL — ABNORMAL LOW (ref 78.0–100.0)
Platelets: 272 10*3/uL (ref 150–400)
RBC: 4.42 MIL/uL (ref 3.87–5.11)
RDW: 23.5 % — ABNORMAL HIGH (ref 11.5–15.5)
WBC: 5.4 10*3/uL (ref 4.0–10.5)

## 2013-07-12 LAB — TYPE AND SCREEN
ABO/RH(D): A POS
Antibody Screen: NEGATIVE
Unit division: 0
Unit division: 0
Unit division: 0

## 2013-07-12 LAB — OCCULT BLOOD, POC DEVICE: Fecal Occult Bld: NEGATIVE

## 2013-07-12 SURGERY — COLONOSCOPY WITH ESOPHAGOGASTRODUODENOSCOPY (EGD)
Anesthesia: Moderate Sedation

## 2013-07-12 MED ORDER — MIDAZOLAM HCL 5 MG/5ML IJ SOLN
INTRAMUSCULAR | Status: DC | PRN
  Administered 2013-07-12 (×2): 2 mg via INTRAVENOUS
  Administered 2013-07-12 (×2): 1 mg via INTRAVENOUS

## 2013-07-12 MED ORDER — STERILE WATER FOR IRRIGATION IR SOLN
Status: DC | PRN
  Administered 2013-07-12: 14:00:00

## 2013-07-12 MED ORDER — ONDANSETRON HCL 4 MG/2ML IJ SOLN
INTRAMUSCULAR | Status: DC | PRN
  Administered 2013-07-12: 4 mg via INTRAVENOUS

## 2013-07-12 MED ORDER — PANTOPRAZOLE SODIUM 40 MG PO TBEC
40.0000 mg | DELAYED_RELEASE_TABLET | Freq: Every day | ORAL | Status: DC
  Administered 2013-07-13: 40 mg via ORAL
  Filled 2013-07-12: qty 1

## 2013-07-12 MED ORDER — MEPERIDINE HCL 100 MG/ML IJ SOLN
INTRAMUSCULAR | Status: AC
  Filled 2013-07-12: qty 2

## 2013-07-12 MED ORDER — PEG 3350-KCL-NA BICARB-NACL 420 G PO SOLR
4000.0000 mL | Freq: Once | ORAL | Status: AC
  Administered 2013-07-12: 4000 mL via ORAL
  Filled 2013-07-12: qty 4000

## 2013-07-12 MED ORDER — LIDOCAINE VISCOUS 2 % MT SOLN
OROMUCOSAL | Status: AC
  Filled 2013-07-12: qty 15

## 2013-07-12 MED ORDER — MIDAZOLAM HCL 5 MG/5ML IJ SOLN
INTRAMUSCULAR | Status: AC
  Filled 2013-07-12: qty 10

## 2013-07-12 MED ORDER — SODIUM CHLORIDE 0.9 % IV SOLN: INTRAVENOUS | Status: DC

## 2013-07-12 MED ORDER — LIDOCAINE VISCOUS 2 % MT SOLN
OROMUCOSAL | Status: DC | PRN
  Administered 2013-07-12: 1 via OROMUCOSAL

## 2013-07-12 MED ORDER — MEPERIDINE HCL 100 MG/ML IJ SOLN
INTRAMUSCULAR | Status: DC | PRN
  Administered 2013-07-12: 25 mg via INTRAVENOUS
  Administered 2013-07-12: 50 mg via INTRAVENOUS

## 2013-07-12 MED ORDER — ONDANSETRON HCL 4 MG/2ML IJ SOLN
INTRAMUSCULAR | Status: AC
  Filled 2013-07-12: qty 2

## 2013-07-12 NOTE — Progress Notes (Signed)
  PROGRESS NOTE  Rhonda Harris ZYY:[id number] DOB: [birth year] DOA: 07/10/2013 PCP: Rubbie Battiest, MD  Summary: 53 year old woman without significant medical history presenting with increasing shortness of breath, as is reflux. Found to have severe anemia.  Assessment/Plan: 1. Severe microcytic anemia without history of obvious bleeding. Status post upper and lower endoscopy today which revealed distal esophageal erosions, or erosive reflux esophagitis. Other findings as below. Status post 3 units packed red blood cells. 2. GERD. Stable.   Advance diet per GI. Daily PPI. Check CBC in the morning.  Discharge home on oral iron.   Likely home 3/3  Code Status: full code DVT prophylaxis: SCDs Family Communication:  Discussed with husband at bedside Disposition Plan:   [name], MD  [facility]  Pager [phone] If 7PM-7AM, please contact night-coverage at [email or web address], password [facility] 07/12/2013, 3:06 PM  LOS: 2 days   Consultants:  Gastroenterology  Procedures:  3/1 Transfusion 3 units packed red blood cells   3/2 Distal esophageal erosions consistent with mild erosive reflux esophagitis. Moderately large size hiatal hernia. Antral erosions - status post antral biopsy. Abnormal duodenal mucosa of uncertain significance A. status post biopsy to evaluate for celiac disease.  3/2 colonoscopy Ileal erosions of uncertain significance-status post biopsy. Colonic diverticulosis; otherwise, normal colonoscopy  HPI/Subjective: Feeling better today. No complaints.  Objective: Filed Vitals:   07/12/13 1435 07/12/13 1440 07/12/13 1445 07/12/13 1500  BP: 117/57 118/60  131/87  Pulse: 81 83 86 76  Temp:      TempSrc:      Resp: 16 17 17 17   Height:      Weight:      SpO2: 99% 99% 99% 99%    Intake/Output Summary (Last 24 hours) at 07/12/13 1506 Last data filed at 07/11/13 2148  Gross per 24 hour  Intake 1421.67 ml  Output      0 ml  Net 1421.67 ml     Filed  Weights   07/10/13 2050 07/11/13 0256  Weight: 104.327 kg (230 lb) 106.096 kg (233 lb 14.4 oz)    Exam:   Afebrile, vital signs stable  Gen. Appears calm and comfortable. Speech fluent and clear.  Cardiovascular regular rate and rhythm. No murmur, rub or gallop. No lower extremity edema.  Respiratory clear to auscultation bilaterally. No wheezes, rales or rhonchi. Normal respiratory effort.  Data Reviewed:  Hemoglobin 9.1 status post 3 units packed red blood cells  Scheduled Meds: . lidocaine      . meperidine      . midazolam      . ondansetron      . pantoprazole (PROTONIX) IV  40 mg Intravenous Q12H  . sodium chloride  3 mL Intravenous Q12H   Continuous Infusions: . sodium chloride 50 mL/hr at 07/11/13 2148    Principal Problem:   Microcytic anemia Active Problems:   Reflux esophagitis   Dysphagia   Abdominal pain   Anemia   Time spent 25 minutes

## 2013-07-12 NOTE — Op Note (Signed)
[facility] [address] [place], [address]   ENDOSCOPY PROCEDURE REPORT  PATIENT: [name], [name]  MR#: [id number] BIRTHDATE: 07-22-1960 , 52  yrs. old GENDER: Female ENDOSCOPIST: R.  Garfield Cornea, MD FACP [facility] REFERRED BY:  Rosemary Holms, M.D. PROCEDURE DATE:  07/12/2013 PROCEDURE:     EGD with gastric and duodenal biopsy  INDICATIONS:     Profound iron deficiency anemia requiring transfusion; recent self-limiting chest pain and odynophagia  INFORMED CONSENT:   The risks, benefits, limitations, alternatives and imponderables have been discussed.  The potential for biopsy, esophogeal dilation, etc. have also been reviewed.  Questions have been answered.  All parties agreeable.  Please see the history and physical in the medical record for more information.  MEDICATIONS:    Versed 5 mg IV and Demerol 75 mg IV in divided doses. Xylocaine gel for topical orao-pharyngeal anesthesia. Zofran 4 mg IV  DESCRIPTION OF PROCEDURE:   The EL-0761H ([id number])  endoscope was introduced through the mouth and advanced to the second portion of the duodenum without difficulty or limitations.  The mucosal surfaces were surveyed very carefully during advancement of the scope and upon withdrawal.  Retroflexion view of the proximal stomach and esophagogastric junction was performed.      FINDINGS: Couple of small distal esophageal erosions consistent with reflux esophagitis. No Barrett's esophagus. Stomach empty. Large (5-6 cm) hiatal hernia. Antral erosions. No ulcer or infiltrating process. Patent pylorus. Examination first, second and third portion duodenum obstructed some accentuated: "featerhing" of the mucosa. A couple of the mucosal fold tips had a scalloped appearance. This was a subtle finding.  THERAPEUTIC / DIAGNOSTIC MANEUVERS PERFORMED:  Biopsies of the abnormal antrum and duodenal mucosa taken for histologic study   COMPLICATIONS:  None  IMPRESSION:   Distal esophageal erosions consistent with mild erosive reflux esophagitis. Moderately large size hiatal hernia. Antral erosions - status post antral biopsy. Abnormal duodenal mucosa of uncertain significance A. status post biopsy to evaluate for celiac disease.  RECOMMENDATIONS:  Advance diet as tolerated. Daily PPI. Followup on pathology. See colonoscopy.    _______________________________ R. Garfield Cornea, MD FACP [facility] eSigned:  R. Garfield Cornea, MD FACP [facility] 07/12/2013 2:48 PM     CC:  PATIENT NAME:  Shamonique, Battiste MR#: [id number]

## 2013-07-12 NOTE — Op Note (Signed)
[facility] [address] [place], [address]   COLONOSCOPY PROCEDURE REPORT  PATIENT: Rhonda Harris, Rhonda Harris  MR#:         [id number] BIRTHDATE: Jan 27, 1961 , 52  yrs. old GENDER: Female ENDOSCOPIST: R.  Roetta Sessions, MD FACP [facility] REFERRED BY:  Simone Curia, M.D. PROCEDURE DATE:  07/12/2013 PROCEDURE:     ileocolonoscopy with ileal biopsy  INDICATIONS: Profound iron deficiency anemia; hemoglobin up to 9.1 after 3 unit transfusion of packed red blood cells  INFORMED CONSENT:  The risks, benefits, alternatives and imponderables including but not limited to bleeding, perforation as well as the possibility of a missed lesion have been reviewed.  The potential for biopsy, lesion removal, etc. have also been discussed.  Questions have been answered.  All parties agreeable. Please see the history and physical in the medical record for more information.  MEDICATIONS: Versed 6 mg IV and Demerol 75 mg IV in divided doses. Zofran 4 mg IV  DESCRIPTION OF PROCEDURE:  After a digital rectal exam was performed, the EG-2990i ([id number]) and EC-3890Li ([id number]) colonoscope was advanced from the anus through the rectum and colon to the area of the cecum, ileocecal valve and appendiceal orifice. The cecum was deeply intubated.  These structures were well-seen and photographed for the record.  From the level of the cecum and ileocecal valve, the scope was slowly and cautiously withdrawn. The mucosal surfaces were carefully surveyed utilizing scope tip deflection to facilitate fold flattening as needed.  The scope was pulled down into the rectum where a thorough examination including retroflexion was performed.    FINDINGS:  Adequate preparation. Normal rectum. Few scattered pancolonic diverticula; otherwise, the remainder of the colonic mucosa appeared normal. The distal 10 cm of terminal ileum was well seen. There were multiple erosions seen in the terminal ileum. No frank  ulcer.  THERAPEUTIC / DIAGNOSTIC MANEUVERS PERFORMED:  Biopsies of the abnormal-appearing terminal ileum taken for histologic study  COMPLICATIONS: none  CECAL WITHDRAWAL TIME:  13 minutes  IMPRESSION:  Ileal erosions of uncertain significance-status post biopsy. Colonic diverticulosis; otherwise, normal colonoscopy  RECOMMENDATIONS: Advance diet. Home on oral iron supplement. Daily PPI. Followup on biopsies. See EGD report.   _______________________________ eSigned:  R. Roetta Sessions, MD FACP [facility] 07/12/2013 2:56 PM   CC:    PATIENT NAME:  Lura, Falor MR#: [id number]

## 2013-07-13 ENCOUNTER — Encounter: Payer: Self-pay | Admitting: Gastroenterology

## 2013-07-13 ENCOUNTER — Telehealth: Payer: Self-pay | Admitting: Gastroenterology

## 2013-07-13 DIAGNOSIS — D509 Iron deficiency anemia, unspecified: Secondary | ICD-10-CM

## 2013-07-13 LAB — CBC
HEMATOCRIT: 29.5 % — AB (ref 36.0–46.0)
Hemoglobin: 8.8 g/dL — ABNORMAL LOW (ref 12.0–15.0)
MCH: 20.1 pg — ABNORMAL LOW (ref 26.0–34.0)
MCHC: 29.8 g/dL — ABNORMAL LOW (ref 30.0–36.0)
MCV: 67.5 fL — AB (ref 78.0–100.0)
Platelets: 282 10*3/uL (ref 150–400)
RBC: 4.37 MIL/uL (ref 3.87–5.11)
RDW: 24.5 % — ABNORMAL HIGH (ref 11.5–15.5)
WBC: 6.5 10*3/uL (ref 4.0–10.5)

## 2013-07-13 MED ORDER — FERROUS SULFATE 325 (65 FE) MG PO TABS: 325.0000 mg | ORAL_TABLET | Freq: Every day | ORAL | Status: DC

## 2013-07-13 NOTE — Telephone Encounter (Signed)
Please arrange hospital f/u for IDA in the next 2-4 weeks.

## 2013-07-13 NOTE — Discharge Summary (Signed)
Physician Discharge Summary  Rhonda Harris WSF:[id number] DOB: [birth year] DOA: 07/10/2013  PCP: Rubbie Battiest, MD  Admit date: 07/10/2013 Discharge date: 07/13/2013  Time spent: 35 minutes  Recommendations for Outpatient Follow-up:  1. Needs complete blood count and reticulocyte count in about a week 2. Started on oral iron this admission 3. Please followup on endoscopy/colonoscopy biopsy results performed 07/12/13   Discharge Diagnoses:  Principal Problem:   Microcytic anemia Active Problems:   Reflux esophagitis   Dysphagia   Abdominal pain   Anemia   Discharge Condition:  stable   Diet recommendation:  heart healthy   Filed Weights   07/10/13 2050 07/11/13 0256  Weight: 104.327 kg (230 lb) 106.096 kg (233 lb 14.4 oz)    History of present illness:   pleasant 54 year old Caucasian female admitted 07/02/13 with 2-3 months of dysphagia and then 3 weeks if dyspnea on exertion fatigue and chest discomfort associated with burning in the chest. Workup was performed in the outpatient setting by primary care physician who saw her on 2/20, 2/24 for initially reflux disease treated appropriately with Carafate, Protonix and then for potential viral diarrhea.  Lab work performed on second visit confirmed a low hemoglobin of 5.4   patient was in the hospital where she underwent further workup as below  Hospital Course:   1. Severe microcytic anemia without history of obvious bleeding. Status post upper and lower endoscopy 07/13/78 which revealed distal esophageal erosions, or erosive reflux esophagitis-there was a question raised of potential celiac disease. Other findings as below. Status post 3 units packed red blood cells this admission with no overt dizziness or weakness.  She will be started on iron on discharge and will need labs as above 2. GERD. Stable.   Procedures: COLONOSCOPY PROCEDURE REPORT  PATIENT: Rhonda, Harris MR#: [id number]  BIRTHDATE: 1960/12/25 , 52 yrs. old GENDER:  Female  ENDOSCOPIST: R. Garfield Cornea, MD FACP [facility]  REFERRED BY: Rosemary Holms, M.D.  PROCEDURE DATE: 07/12/2013  PROCEDURE: ileocolonoscopy with ileal biopsy  INDICATIONS: Profound iron deficiency anemia; hemoglobin up to 9.1  after 3 unit transfusion of packed red blood cells  INFORMED CONSENT: The risks, benefits, alternatives and  imponderables including but not limited to bleeding, perforation as  well as the possibility of a missed lesion have been reviewed. The  potential for biopsy, lesion removal, etc. have also been  discussed. Questions have been answered. All parties agreeable.  Please see the history and physical in the medical record for more  information.  MEDICATIONS: Versed 6 mg IV and Demerol 75 mg IV in divided doses.  Zofran 4 mg IV  DESCRIPTION OF PROCEDURE: After a digital rectal exam was  performed, the EG-2990i ([id number]) and EC-3890Li ([id number])  colonoscope was advanced from the anus through the rectum and colon  to the area of the cecum, ileocecal valve and appendiceal orifice.  The cecum was deeply intubated. These structures were well-seen  and photographed for the record. From the level of the cecum and  ileocecal valve, the scope was slowly and cautiously withdrawn.  The mucosal surfaces were carefully surveyed utilizing scope tip  deflection to facilitate fold flattening as needed. The scope was  pulled down into the rectum where a thorough examination including  retroflexion was performed.  FINDINGS: Adequate preparation. Normal rectum. Few scattered  pancolonic diverticula; otherwise, the remainder of the colonic  mucosa appeared normal. The distal 10 cm of terminal ileum was well  seen. There were multiple erosions seen in the terminal  ileum. No  frank ulcer.  THERAPEUTIC / DIAGNOSTIC MANEUVERS PERFORMED: Biopsies of the  abnormal-appearing terminal ileum taken for histologic study  COMPLICATIONS:none  CECAL WITHDRAWAL TIME: 13 minutes   IMPRESSION: Ileal erosions of uncertain significance-status post  biopsy. Colonic diverticulosis; otherwise, normal colonoscopy  RECOMMENDATIONS: Advance diet. Home on oral iron supplement. Daily  PPI. Followup on biopsies. See EGD report.  _______________________________  eSigned: R. Garfield Cornea, MD Rosalita Chessman [facility] 07/12/2013 2:56 PM    Consultations:   gastroenterology   Discharge Exam: Filed Vitals:   07/13/13 0500  BP: 119/67  Pulse: 71  Temp: 97.9 F (36.6 C)  Resp: 18    General:  alert pleasant oriented no apparent distress  Cardiovascular:  EOMI NCAT S1-S2 no murmur rub or gallop  Respiratory:  clear   Discharge Instructions  Discharge Orders   Future Appointments Provider Department Dept Phone   07/19/2013 9:00 AM Mikey Kirschner, MD [place] [phone]   08/02/2013 9:00 AM Nilda Simmer, NP [facility] [phone]   Future Orders Complete By Expires   Diet - low sodium heart healthy  As directed    Discharge instructions  As directed    Comments:     The reason for low blood count is unclear so far Recommend that you take oral iron for the next month to month and a half until we know a reason-this can cause a stool to get dark Please followup with her primary care physician for further preventive care including interval screening. I recommended that were done including CBC and reticulocyte count in about a week   Increase activity slowly  As directed        Medication List         ferrous sulfate 325 (65 FE) MG tablet  Commonly known as:  FERROUSUL  Take 1 tablet (325 mg total) by mouth daily with breakfast.     ondansetron 4 MG disintegrating tablet  Commonly known as:  ZOFRAN ODT  Take 1 tablet (4 mg total) by mouth every 8 (eight) hours as needed for nausea or vomiting.     pantoprazole 40 MG tablet  Commonly known as:  PROTONIX  Take 1 tablet (40 mg total) by mouth every morning.     sucralfate 1 G tablet   Commonly known as:  CARAFATE  Take 1 g by mouth 4 (four) times daily -  with meals and at bedtime.       No Known Allergies    The results of significant diagnostics from this hospitalization (including imaging, microbiology, ancillary and laboratory) are listed below for reference.    Significant Diagnostic Studies: Ct Abdomen Pelvis W Contrast  07/11/2013   CLINICAL DATA:  Abdominal pain, anemia  EXAM: CT ABDOMEN AND PELVIS WITH CONTRAST  TECHNIQUE: Multidetector CT imaging of the abdomen and pelvis was performed using the standard protocol following bolus administration of intravenous contrast.  CONTRAST:  115m OMNIPAQUE IOHEXOL 300 MG/ML  SOLN  COMPARISON:  None.  FINDINGS: Lung bases are clear.  Moderate hiatal hernia with fluid/debris.  Liver, spleen, pancreas, and adrenal glands are within normal limits.  Gallbladder is decompressed with a 12 mm gallstone (series 2/ image 29). No associated inflammatory changes by CT. No intrahepatic or extrahepatic ductal dilatation.  Kidneys are unremarkable.  No hydronephrosis.  No evidence of bowel obstruction.  Normal appendix.  No evidence of abdominal aortic aneurysm.  No abdominopelvic ascites.  No suspicious abdominopelvic lymphadenopathy.  Uterus and bilateral ovaries are unremarkable.  Bladder is within normal limits.  Degenerative changes of the visualized thoracolumbar spine.  IMPRESSION: No evidence of bowel obstruction.  Normal appendix.  Cholelithiasis, without associated inflammatory changes by CT.  Moderate hiatal hernia with fluid/debris.   Electronically Signed   By: Julian Hy M.D.   On: 07/11/2013 00:44    Microbiology: No results found for this or any previous visit (from the past 240 hour(s)).   Labs: Basic Metabolic Panel:  Recent Labs Lab 07/10/13 0919 07/10/13 2135 07/11/13 0739 07/12/13 0559  NA 140 139 138 141  K 4.1 3.1* 4.0 3.9  CL 104 99 102 105  CO2 28 30 27 27   GLUCOSE 99 118* 98 87  BUN 11 10 8 8    CREATININE 0.65 0.74 0.65 0.66  CALCIUM 9.0 9.1 8.8 9.2   Liver Function Tests:  Recent Labs Lab 07/10/13 0919 07/10/13 2135 07/11/13 0739  AST 20 21 19   ALT 12 14 13   ALKPHOS 49 61 58  BILITOT 0.4 0.2* 0.5  PROT 6.3 7.0 6.6  ALBUMIN 3.6 3.2* 3.0*    Recent Labs Lab 07/10/13 2135  LIPASE 68*   No results found for this basename: AMMONIA,  in the last 168 hours CBC:  Recent Labs Lab 07/10/13 0919 07/10/13 2135 07/11/13 0739 07/12/13 0559 07/13/13 0538  WBC 5.6 6.7 6.5 5.4 6.5  NEUTROABS 3.0 3.7  --   --   --   HGB 5.4* 5.5* 7.4* 9.1* 8.8*  HCT 20.2* 20.5* 25.0* 29.5* 29.5*  MCV 58.0* 60.8* 64.9* 66.7* 67.5*  PLT 337 314 254 272 282   Cardiac Enzymes: No results found for this basename: CKTOTAL, CKMB, CKMBINDEX, TROPONINI,  in the last 168 hours BNP: BNP (last 3 results) No results found for this basename: PROBNP,  in the last 8760 hours CBG: No results found for this basename: GLUCAP,  in the last 168 hours     Signed:  Nita Sells  [facility] 07/13/2013, 10:37 AM

## 2013-07-13 NOTE — ED Provider Notes (Signed)
Medical screening examination/treatment/procedure(s) were performed by non-physician practitioner and as supervising physician I was immediately available for consultation/collaboration.   EKG Interpretation   Date/Time:  Saturday July 10 2013 23:32:59 EST Ventricular Rate:  87 PR Interval:  176 QRS Duration: 100 QT Interval:  382 QTC Calculation: 459 R Axis:   48 Text Interpretation:  Normal sinus rhythm Nonspecific ST abnormality  Abnormal ECG No previous ECGs available ED PHYSICIAN INTERPRETATION  AVAILABLE IN [facility] Confirmed by TEST, Record ([id number]) on 07/12/2013  2:31:44 PM        Benny Lennert, MD 07/13/13 1453

## 2013-07-13 NOTE — Progress Notes (Signed)
    Subjective: No abdominal pain, N/V, no overt signs of GI bleeding. Tolerating diet.   Objective: Vital signs in last 24 hours: Temp:  [97.9 F (36.6 C)-98.1 F (36.7 C)] 97.9 F (36.6 C) (03/03 0500) Pulse Rate:  [71-94] 71 (03/03 0500) Resp:  [12-22] 18 (03/03 0500) BP: (105-138)/(55-117) 119/67 mmHg (03/03 0500) SpO2:  [91 %-99 %] 97 % (03/03 0500) FiO2 (%):  [90 %] 90 % (03/02 1405) Last BM Date: 07/12/13 General:   Alert and oriented, pleasant Heart:  S1, S2 present, no murmurs noted.  Lungs: Clear to auscultation bilaterally, without wheezing, rales, or rhonchi.  Abdomen:  Bowel sounds present, soft, non-tender, non-distended. No HSM or hernias noted. Extremities:  Without clubbing or edema.   Intake/Output from previous day: 03/02 0701 - 03/03 0700 In: 240 [P.O.:240] Out: 1150 [Urine:1150] Intake/Output this shift:    Lab Results:  Recent Labs  07/11/13 0739 07/12/13 0559 07/13/13 0538  WBC 6.5 5.4 6.5  HGB 7.4* 9.1* 8.8*  HCT 25.0* 29.5* 29.5*  PLT 254 272 282   BMET  Recent Labs  07/10/13 2135 07/11/13 0739 07/12/13 0559  NA 139 138 141  K 3.1* 4.0 3.9  CL 99 102 105  CO2 30 27 27   GLUCOSE 118* 98 87  BUN 10 8 8   CREATININE 0.74 0.65 0.66  CALCIUM 9.1 8.8 9.2   LFT  Recent Labs  07/10/13 0919 07/10/13 2135 07/11/13 0739  PROT 6.3 7.0 6.6  ALBUMIN 3.6 3.2* 3.0*  AST 20 21 19   ALT 12 14 13   ALKPHOS 49 61 58  BILITOT 0.4 0.2* 0.5  BILIDIR 0.1  --   --   IBILI 0.3  --   --    PT/INR  Recent Labs  07/10/13 2135  LABPROT 12.2  INR 0.92   Hepatitis Panel  Recent Labs  07/10/13 0919  HCVAB NEGATIVE    Assessment: 53 year old admitted with profound IDA, heme negative, with colonoscopy revealing ileal erosions, biopsy pending. EGD with erosive esophagitis, large hiatal hernia, antral erosions s/p biopsy. No evidence of overt GI bleeding; clinically stable. Appropriate for discharge with close outpatient follow-up.    Plan: PPI daily Follow-up on pending biopsies Oral iron supplementation as outpatient Will arrange follow-up in our office in about 2 weeks; anticipate discharge home today.   07/12/13, ANP-BC [facility]     LOS: 3 days    07/13/2013, 7:48 AM

## 2013-07-13 NOTE — Telephone Encounter (Signed)
Pt is aware of OV on 3/31 at 230 with AS and appt card was mailed

## 2013-07-13 NOTE — ED Provider Notes (Signed)
CSN: [id number]     Arrival date & time 07/10/13  2033 History   First MD Initiated Contact with Patient 07/10/13 2107     Chief Complaint  Patient presents with  . abnormal labs      (Consider location/radiation/quality/duration/timing/severity/associated sxs/prior Treatment) HPI Comments: She was seen recently by her doctor for symptoms of fatigue and generalized weakness. Blood studies were ordered but not done until today secondary to patient being ill with N, V, D for 3 days prior. She was contacted by her doctor's office to come to the ED for further evaluation of significantly low hemoglobin. She denies melena or bloody stools. She also denies hematemesis. No other medical history, no current medications. She reports not having been to the doctor's office for the past 12 years.   The history is provided by the patient. No language interpreter was used.    Past Medical History  Diagnosis Date  . Esophageal erosions   . GERD (gastroesophageal reflux disease)    Past Surgical History  Procedure Laterality Date  . Tubal ligation     History reviewed. No pertinent family history. History  Substance Use Topics  . Smoking status: Former Games developer  . Smokeless tobacco: Former Neurosurgeon    Quit date: 07/02/1993  . Alcohol Use: No   OB History   Grav Para Term Preterm Abortions TAB SAB Ect Mult Living                 Review of Systems  Constitutional: Positive for fatigue. Negative for fever and chills.  HENT: Negative.   Respiratory: Negative.   Cardiovascular: Negative.   Gastrointestinal: Negative.   Musculoskeletal: Negative.   Skin: Negative.   Neurological: Positive for weakness.      Allergies  Review of patient's allergies indicates no known allergies.  Home Medications   Current Outpatient Rx  Name  Route  Sig  Dispense  Refill  . ondansetron (ZOFRAN ODT) 4 MG disintegrating tablet   Oral   Take 1 tablet (4 mg total) by mouth every 8 (eight) hours as needed  for nausea or vomiting.   24 tablet   0   . pantoprazole (PROTONIX) 40 MG tablet   Oral   Take 1 tablet (40 mg total) by mouth every morning.   30 tablet   2   . sucralfate (CARAFATE) 1 G tablet   Oral   Take 1 g by mouth 4 (four) times daily -  with meals and at bedtime.         . ferrous sulfate (FERROUSUL) 325 (65 FE) MG tablet   Oral   Take 1 tablet (325 mg total) by mouth daily with breakfast.   30 tablet   3    BP 119/67  Pulse 71  Temp(Src) 97.9 F (36.6 C) (Oral)  Resp 18  Ht 5\' 5"  (1.651 m)  Wt 233 lb 14.4 oz (106.096 kg)  BMI 38.92 kg/m2  SpO2 97% Physical Exam  Constitutional: She is oriented to person, place, and time. She appears well-developed and well-nourished.  HENT:  Head: Normocephalic.  Eyes:  Conjunctival pallor.   Neck: Normal range of motion. Neck supple.  Cardiovascular: Normal rate and regular rhythm.   No murmur heard. Pulmonary/Chest: Effort normal and breath sounds normal.  Abdominal: Soft. Bowel sounds are normal. There is no tenderness. There is no rebound and no guarding.  Musculoskeletal: Normal range of motion.  Neurological: She is alert and oriented to person, place, and time.  Skin: Skin  is warm and dry. No rash noted. There is pallor.  Psychiatric: She has a normal mood and affect.    ED Course  Procedures (including critical care time) Labs Review Labs Reviewed  CBC WITH DIFFERENTIAL - Abnormal; Notable for the following:    RBC 3.37 (*)    Hemoglobin 5.5 (*)    HCT 20.5 (*)    MCV 60.8 (*)    MCH 16.3 (*)    MCHC 26.8 (*)    RDW 19.7 (*)    Monocytes Relative 0 (*)    Monocytes Absolute 0.0 (*)    All other components within normal limits  COMPREHENSIVE METABOLIC PANEL - Abnormal; Notable for the following:    Potassium 3.1 (*)    Glucose, Bld 118 (*)    Albumin 3.2 (*)    Total Bilirubin 0.2 (*)    All other components within normal limits  IRON AND TIBC - Abnormal; Notable for the following:    Iron 13 (*)     TIBC 487 (*)    Saturation Ratios 3 (*)    UIBC 474 (*)    All other components within normal limits  FERRITIN - Abnormal; Notable for the following:    Ferritin 6 (*)    All other components within normal limits  RETICULOCYTES - Abnormal; Notable for the following:    RBC. 3.37 (*)    All other components within normal limits  LIPASE, BLOOD - Abnormal; Notable for the following:    Lipase 68 (*)    All other components within normal limits  COMPREHENSIVE METABOLIC PANEL - Abnormal; Notable for the following:    Albumin 3.0 (*)    All other components within normal limits  CBC - Abnormal; Notable for the following:    RBC 3.85 (*)    Hemoglobin 7.4 (*)    HCT 25.0 (*)    MCV 64.9 (*)    MCH 19.2 (*)    MCHC 29.6 (*)    RDW 24.1 (*)    All other components within normal limits  CBC - Abnormal; Notable for the following:    Hemoglobin 9.1 (*)    HCT 29.5 (*)    MCV 66.7 (*)    MCH 20.6 (*)    RDW 23.5 (*)    All other components within normal limits  CBC - Abnormal; Notable for the following:    Hemoglobin 8.8 (*)    HCT 29.5 (*)    MCV 67.5 (*)    MCH 20.1 (*)    MCHC 29.8 (*)    RDW 24.5 (*)    All other components within normal limits  PROTIME-INR  APTT  VITAMIN B12  FOLATE  BASIC METABOLIC PANEL  POC OCCULT BLOOD, ED  OCCULT BLOOD, POC DEVICE  TYPE AND SCREEN  PREPARE RBC (CROSSMATCH)  ABO/RH  PREPARE RBC (CROSSMATCH)   Imaging Review No results found.   EKG Interpretation   Date/Time:  Saturday July 10 2013 23:32:59 EST Ventricular Rate:  87 PR Interval:  176 QRS Duration: 100 QT Interval:  382 QTC Calculation: 459 R Axis:   48 Text Interpretation:  Normal sinus rhythm Nonspecific ST abnormality  Abnormal ECG No previous ECGs available ED PHYSICIAN INTERPRETATION  AVAILABLE IN [facility] Confirmed by TEST, Record ([id number]) on 07/12/2013  2:31:44 PM      MDM   Final diagnoses:  Anemia    1. Symptomatic anemia  Blood studies  ordered to verfy abnormalities. Two units packed RBCs ordered to  transfuse. Patient is in NAD, VSS. Admitted to [facility].     Arnoldo Hooker, PA-C 07/13/13 1358

## 2013-07-15 ENCOUNTER — Other Ambulatory Visit: Payer: Self-pay

## 2013-07-15 ENCOUNTER — Encounter (HOSPITAL_COMMUNITY): Payer: Self-pay | Admitting: Internal Medicine

## 2013-07-15 ENCOUNTER — Encounter: Payer: Self-pay | Admitting: Internal Medicine

## 2013-07-15 ENCOUNTER — Telehealth: Payer: Self-pay

## 2013-07-15 DIAGNOSIS — K900 Celiac disease: Secondary | ICD-10-CM

## 2013-07-15 NOTE — Telephone Encounter (Signed)
Results Cc to PCP  

## 2013-07-15 NOTE — Telephone Encounter (Signed)
Letter from: Corbin Ade  Reason for Letter: Results Review  Send letter to patient.  Send copy of letter with path to referring provider and PCP.   Need blood work for total IgA level and transglutaminase IgA level ASAP. Followup in the office in the coming weeks with extender

## 2013-07-15 NOTE — Telephone Encounter (Signed)
Lab orders done and mailed to pt with letter.

## 2013-07-19 ENCOUNTER — Encounter: Payer: Self-pay | Admitting: Family Medicine

## 2013-07-19 ENCOUNTER — Ambulatory Visit (HOSPITAL_COMMUNITY)
Admission: RE | Admit: 2013-07-19 | Discharge: 2013-07-19 | Disposition: A | Discharge status: Home / Self Care | Payer: No Typology Code available for payment source | Source: Ambulatory Visit | Attending: Family Medicine | Admitting: Family Medicine

## 2013-07-19 ENCOUNTER — Ambulatory Visit (INDEPENDENT_AMBULATORY_CARE_PROVIDER_SITE_OTHER): Payer: No Typology Code available for payment source | Admitting: Family Medicine

## 2013-07-19 VITALS — BP 128/82 | Ht 64.5 in | Wt 228.8 lb

## 2013-07-19 DIAGNOSIS — K2100 Gastro-esophageal reflux disease with esophagitis, without bleeding: Secondary | ICD-10-CM

## 2013-07-19 DIAGNOSIS — D649 Anemia, unspecified: Secondary | ICD-10-CM

## 2013-07-19 DIAGNOSIS — M79609 Pain in unspecified limb: Secondary | ICD-10-CM

## 2013-07-19 DIAGNOSIS — M79605 Pain in left leg: Secondary | ICD-10-CM

## 2013-07-19 DIAGNOSIS — R1310 Dysphagia, unspecified: Secondary | ICD-10-CM

## 2013-07-19 LAB — POCT HEMOGLOBIN: Hemoglobin: 10.8 g/dL — AB (ref 12.2–16.2)

## 2013-07-19 NOTE — Patient Instructions (Addendum)
Increase your iron tab to twice per day  Add a multivit otc with folic acid daily  We will re ck your hgb in a month  Gluten-Free Diet Gluten is a protein found in many grains. Gluten is present in wheat, rye, and barley. Gluten from wheat, rye, and barley protein interferes with the absorption of food in people with gluten sensitivity. It may also cause intestinal injury when eaten by individuals with gluten sensitivity.  A sample piece (biopsy) of the small intestine is usually required for a positive diagnosis of gluten sensitivity. Dietary treatment consists of eliminating foods and food ingredients from wheat, rye, and barley. When these are taken out of the diet completely, most people regain function of the small intestine. Strict compliance is important even during symptom-free periods. People with gluten sensitivity need to be on a gluten-free diet for a lifetime. During the first stages of treatment, some people will also need to restrict dairy products that contain lactose, which is a naturally occurring sugar. Lactose is difficult to absorb when the small intestines are damaged (lactose intolerance).  WHO NEEDS THIS DIET Some people who have certain diseases need to be on a gluten-free diet. These diseases include:  Celiac disease.  Nontropical sprue.  Gluten-sensitive enteropathy.  Dermatitis herpetiformis. SPECIAL NOTES  Read all labels because gluten may have been added as an incidental ingredient. Words to check for on the label include: flour, starch, durum flour, graham flour, phosphated flour, self-rising flour, semolina, farina, modified food starch, cereal, thickening, fillers, emulsifiers, any kind of malt flavoring, and hydrolyzed vegetable protein. A registered dietician can help you identify possible harmful ingredients in the foods you normally eat.  If you are not sure whether an ingredient contains gluten, check with the manufacturer. Note that some manufacturers  may change ingredients without notice. Always read labels.  Since flour and cereal products are often used in the preparation of foods, it is important to be aware of the methods of preparation used, as well as the foods themselves. This is especially true when you are dining out. Starches  Allowed: Only those prepared from arrowroot, corn, potato, rice, and bean flours. Rice wafers(*), pure cornmeal tortillas, popcorn, some crackers, and chips(*). Hot cereals made from cornmeal. Ask your dietician which specific hot and cold cereals are allowed. White or sweet potatoes, yams, hominy, rice or wild rice, and special gluten-free pasta. Some oriental rice noodles or bean noodles.  Avoid: All wheat and rye cereals, wheat germ, barley, bran, graham, malt, bulgur, and millet(-). NOTE: Avoid cereals containing malt as a flavoring, such as rice cereal. Regular noodles, spaghetti, macaroni, and most packaged rice mixes(*). All others containing wheat, rye, or barley. Vegetables  Allowed: All plain, fresh, frozen, or canned vegetables.  Avoid: Creamed vegetables(*) and vegetables canned in sauces(*). Any prepared with wheat, rye, or barley. Fruit  Allowed: All fresh, frozen, canned, or dried fruits. Fruit juices.  Avoid: Thickened or prepared fruits and some pie fillings(*). Meat and Meat Substitutes  Allowed: Meat, fish, poultry, or eggs prepared without added wheat, rye, or barley. Luncheon meat(*), frankfurters(*), and pure meat. All aged cheese and processed cheese products(*). Cottage cheese(+) and cream cheese(+). Dried beans, dried peas, and lentils.  Avoid: Any meat or meat alternate containing wheat, rye, barley, or gluten stabilizers. Bread-containing products, such as Swiss steak, croquettes, and meatloaf. Tuna canned in vegetable broth(*); [place] with HVP injected as part of the basting; any cheese product containing oat gum as an ingredient. Milk  Allowed:  Milk. Yogurt made with allowed  ingredients(*).  Avoid: Commercial chocolate milk which may have cereal added(*). Malted milk. Soups and Combination Foods  Allowed: Homemade broth and soups made with allowed ingredients; some canned or frozen soups are allowed(*). Combination or prepared foods that do not contain gluten(*). Read labels.  Avoid: All soups containing wheat, rye, or barley flour. Bouillon and bouillon cubes that contain hydrolyzed vegetable protein (HVP). Combination or prepared foods that contain gluten(*). Desserts  Allowed:  Custard, some pudding mixes(*), homemade puddings from cornstarch, rice, and tapioca. Gelatin desserts, ices, and sherbet(*). Cake, cookies, and other desserts prepared with allowed flours. Some commercial ice creams(*). Ask your dietician about specific brands of dessert that are allowed.  Avoid: Cakes, cookies, doughnuts, and pastries that are prepared with wheat, rye, or barley flour. Some commercial ice creams(*), ice cream flavors which contain cookies, crumbs, or cheesecake(*). Ice cream cones. All commercially prepared mixes for cakes, cookies, and other desserts(*). Bread pudding and other puddings thickened with flour. Sweets  Allowed: Sugar, honey, syrup(*), molasses, jelly, jam, plain hard candy, marshmallows, gumdrops, homemade candies free from wheat, rye, or barley. Coconut.  Avoid: Commercial candies containing wheat, rye, or barley(*). Certain buttercrunch toffees are dusted with wheat flour. Ask your dietician about specific brands that are not allowed. Chocolate-coated nuts, which are often rolled in flour. Fats and Oils  Allowed: Butter, margarine, vegetable oil, sour cream(+), whipping cream, shortening, lard, cream, mayonnaise(*). Some commercial salad dressings(*). Peanut butter.  Avoid: Some commercial salad dressings(*). Beverages  Allowed: Coffee (regular or decaffeinated), tea, herbal tea (read label to be sure that no wheat flour has been added). Carbonated  beverages and some root beers(*). Wine, sake, and distilled spirits, such as gin, vodka, and whiskey.  Avoid:  Certain cereal beverages. Ask your dietician about specific brands that are not allowed. Beer (unless gluten-free), ale, malted milk, and some root beers, wine, and sake. Condiments/ Miscellaneous  Allowed: Salt, pepper, herbs, spices, extracts, and food colorings. Monosodium glutamate (MSG). Cider, rice, and wine vinegar. Baking soda and baking powder. Certain soy sauces. Ask your dietician about specific brands that are allowed. Nuts, coconut, chocolate, and pure cocoa powder.  Avoid: Some curry powder(*), some dry seasoning mixes(*), some gravy extracts(*), some meat sauces(*), some catsup(*), some prepared mustard(*), horseradish(*), some soy sauce(*), chip dips(*), and some chewing gum(*). Yeast extract (contains barley). Caramel color (may contain malt). Ask your dietician about specific brands of condiments to avoid. Flour and Thickening Agents  Allowed: Arrowroot starch (A); Corn bran (B); Corn flour (B,C,D); Corn germ (B); Cornmeal (B,C,D); Corn starch (A); Potato flour (B,C,E); Potato starch flour (B,C,E); Rice bran (B); Rice flours: Plain, brown (B,C,D,E), and Sweet (A,B,C,F). Rice polish (B,C,G); Soy flour (B,C,G); Tapioca starch (A). The flour and thickening agents described above are good for: (A) Good thickening agent (B) Good when combined with other flours (C) Best combined with milk and eggs in baked products (D) Best in grainy-textured products (E) Produces drier product than other flours (F) Produces moister product than other flours (G) Adds distinct flavor to product. Use in moderation. (*) Check labels and investigate any questionable ingredients.  (-) Additional research is needed before this product can be recommended. (+) Check vegetable gum used. SAMPLE MEAL PLAN Breakfast   Orange juice.  Banana.  Rice or corn cereal.  Toast (gluten-free  bread).  Heart-healthy tub margarine.  Jam.  Milk.  Coffee or tea. Lunch  Chicken salad sandwich (with gluten-free bread and mayonnaise).  Sliced tomatoes.  Heart-healthy tub margarine.  Apple.  Milk.  Coffee or tea. Dinner  [place].  Baked potato.  Broccoli.  Lettuce salad with gluten-free dressing.  Gluten-free bread.  Custard.  Heart-healthy margarine.  Coffee or tea. These meal plans are provided as samples. Your daily meal plans will vary. Document Released: 04/29/2005 Document Revised: 10/29/2011 Document Reviewed: 06/09/2011 [facility] Patient Information 2014 [place], [place]. Celiac Disease Celiac disease is a digestive disease that causes your body's natural defense system (immune system) to react against its own cells. It interferes with taking in (absorbing) nutrients from food. Celiac disease is also known as celiac sprue, nontropical sprue, and gluten-sensitive enteropathy. People who have celiac disease cannot tolerate gluten. Gluten is a protein found in wheat, rye, and barley. With time, celiac disease will damage the cells lining the small intestine. This leads to being unable to absorb nutrients from food (malabsorption), diarrhea, and nutritional problems. CAUSES  Celiac disease is genetic. This means you have a higher likelihood of getting the disease if someone in your family has or has had it. Up to 10% of your close relatives (parent, sibling, child) may also have the disease.  People with celiac disease tend to have other autoimmune diseases. The link may be genetic. These diseases include:  Dermatitis herpetiformis.  Thyroid disease.  Systemic lupus erythematosis.  Type 1 diabetes.  Liver disease.  Collagen vascular disease.  Rheumatoid arthritis.  Sjgren's syndrome. SYMPTOMS  The symptoms of celiac disease vary from person to person. The symptoms are generally digestive or nutritional. Digestive symptoms  include:  Recurring belly (abdominal) bloating and pain.  Gas.  Long-term (chronic) diarrhea.  Pale, bad-smelling, greasy, or oily stool. Nutritional symptoms include:  Failure to thrive in infants.  Delayed growth in children.  Weight loss in children and adults.  Missed menstrual periods (often due to extreme weight loss).  Anemia.  Weakening bones (osteoporosis).  Fatigue and weakness.  Tingling or other signs of nerve damage (peripheral neuropathy).  Depression. DIAGNOSIS  If your symptoms and physical exam suggest that a digestive disorder or malnutrition is present, your caregiver may suspect celiac disease. You may have already begun a gluten-free diet. If symptoms persist, testing may be needed to confirm the diagnosis. Some tests are best done while you are on a normal, unrestricted diet. Tests may include:  Blood tests to check for nutritional deficiencies.  Blood tests to look for evidence that the body is producing antibodies against its own small intestine cells.  Taking a tissue sample (biopsy) from the small bowel for evaluation.  X-rays of the small bowel.  Evaluating the stool for fat.  Tests to check for nutrient absorption from the intestine. TREATMENT  It is important to seek treatment. Untreated celiac disease can cause growth problems (in children), anemia, osteoporosis, and possible nerve problems. A pregnant patient with untreated celiac disease has a higher risk of miscarriage, and the fetus has an increased risk of low birth weight and other growth problems. If celiac disease is diagnosed in the early stages, treatment can allow you to live a long, nearly symptom-free life. Treatment includes following a gluten-free diet. This means avoiding all foods that contain gluten. Eating even a small amount of gluten can damage your intestine. For most people, following this diet will stop symptoms. It will heal existing intestinal damage and prevent  further damage. Improvements begin within days of starting the diet. The small intestine is usually completely healed within 3 to 6 months, or it may take up  to 2 years for older adults. A small percentage of people do not improve on the gluten-free diet. Depending on your age and the stage at which you were diagnosed, some problems such as delayed growth and discolored teeth may not improve. Sometimes, damaged intestines cannot heal. If your intestines are not absorbing enough nutrients, you may need to receive nutrition supplements through an intravenous (IV) tube. Drug treatments are being tested for unresponsive celiac disease. In this case, you may need to be evaluated for complications of the disease. Your caregiver may also recommend:  A pneumonia vaccination.  Nutrients and other treatments for any nutritional deficiencies. Your caregiver can provide you with more information on a gluten-free diet. Discussion with a dietician skilled in this illness will be valuable. Support groups may also be helpful. HOME CARE INSTRUCTIONS   Focus on a gluten-free diet. This diet must become a way of life.  Monitor your response to the gluten-free diet and treat any nutritional deficiencies.  Prepare ahead of time if you decide to eat outside the home.  Make and keep your regular follow-up visits with your caregiver.  Suggest to family members that they get screened for early signs of the disease. SEEK MEDICAL CARE IF:   You continue to have digestive symptoms (gas, cramping, diarrhea) despite a proper diet.  You have trouble sticking to the gluten-free diet.  You develop an itchy rash with groups of tiny blisters.  You develop severe weakness, balance problems, menstrual problems, or depression. Document Released: 04/29/2005 Document Revised: 07/22/2011 Document Reviewed: 08/16/2009 [facility] Patient Information 2014 [place], [place].

## 2013-07-19 NOTE — Progress Notes (Signed)
   Subjective:    Patient ID: Rhonda Harris, female    DOB: [birth year], 53 y.o.   MRN: [id number]  HPI Patient arrives for a follow up on reflux. Patient had recent hospitalization for hemoglobin of 5.5- under GI care- had recent EGD and Colonoscopy. Patient stated she received letter from [facility] docs Sat. That says she has Celiac Disease and follows up with them the end of the month.  Letter explaining to pt that she may have celiac disease  Wondering if something needs to be done.  Taking one iron tablet daily, was concerned about her anemia. Hemoglobin when she went home was 8.8.  Notes left leg discomfort. Recalls no injury. Mother had clot recently and this concerns her.  Reflux feels a whole lot better than before Review of Systems No chest pain no shortness breath no abdominal pain no change in bowel habits no blood in stool ROS otherwise the    Objective:   Physical Exam  Alert no acute distress lungs clear. Heart regular rate and rhythm. H&T normal. Neuro exam intact. Abdomen benign. Left leg some pain to deep palpation in the calf. Negative true Homans sign pulses good sensation good Results for orders placed in visit on 07/19/13  POCT HEMOGLOBIN      Result Value Ref Range   Hemoglobin 10.8 (*) 12.2 - 16.2 g/dL        Assessment & Plan:  Impression 1 anemia improved #2 reflux discussed #3 possible celiac disease discussed. #4 left leg discomfort clot essentially ruled out hematoma evident plan decrease iron tablet one daily. Maintain other meds. Followup as scheduled. Followup with specialist. Symptomatic care discussed. WSL

## 2013-07-20 LAB — IGA: IgA: 388 mg/dL — ABNORMAL HIGH (ref 69–380)

## 2013-07-20 LAB — TISSUE TRANSGLUTAMINASE, IGA: Tissue Transglutaminase Ab, IgA: 6.8 U/mL (ref <20)

## 2013-07-26 NOTE — Telephone Encounter (Signed)
Pt has OV for 3/31 at 230 with AS

## 2013-08-02 ENCOUNTER — Encounter: Payer: Self-pay | Admitting: Nurse Practitioner

## 2013-08-02 ENCOUNTER — Ambulatory Visit (INDEPENDENT_AMBULATORY_CARE_PROVIDER_SITE_OTHER): Payer: No Typology Code available for payment source | Admitting: Nurse Practitioner

## 2013-08-02 VITALS — BP 132/90 | Ht 64.0 in | Wt 232.0 lb

## 2013-08-02 DIAGNOSIS — Z124 Encounter for screening for malignant neoplasm of cervix: Secondary | ICD-10-CM

## 2013-08-02 DIAGNOSIS — D509 Iron deficiency anemia, unspecified: Secondary | ICD-10-CM

## 2013-08-02 DIAGNOSIS — E876 Hypokalemia: Secondary | ICD-10-CM

## 2013-08-02 DIAGNOSIS — R7309 Other abnormal glucose: Secondary | ICD-10-CM

## 2013-08-02 DIAGNOSIS — Z01419 Encounter for gynecological examination (general) (routine) without abnormal findings: Secondary | ICD-10-CM

## 2013-08-02 DIAGNOSIS — R739 Hyperglycemia, unspecified: Secondary | ICD-10-CM

## 2013-08-02 DIAGNOSIS — K900 Celiac disease: Secondary | ICD-10-CM

## 2013-08-02 DIAGNOSIS — Z0000 Encounter for general adult medical examination without abnormal findings: Secondary | ICD-10-CM

## 2013-08-03 LAB — BASIC METABOLIC PANEL
BUN: 8 mg/dL (ref 6–23)
CHLORIDE: 103 meq/L (ref 96–112)
CO2: 29 meq/L (ref 19–32)
CREATININE: 0.76 mg/dL (ref 0.50–1.10)
Calcium: 10 mg/dL (ref 8.4–10.5)
Glucose, Bld: 94 mg/dL (ref 70–99)
POTASSIUM: 4.9 meq/L (ref 3.5–5.3)
Sodium: 139 mEq/L (ref 135–145)

## 2013-08-03 LAB — CBC WITH DIFFERENTIAL/PLATELET
Basophils Absolute: 0.1 10*3/uL (ref 0.0–0.1)
Basophils Relative: 1 % (ref 0–1)
EOS PCT: 3 % (ref 0–5)
Eosinophils Absolute: 0.2 10*3/uL (ref 0.0–0.7)
HEMATOCRIT: 37.1 % (ref 36.0–46.0)
Hemoglobin: 11.3 g/dL — ABNORMAL LOW (ref 12.0–15.0)
LYMPHS ABS: 2.2 10*3/uL (ref 0.7–4.0)
LYMPHS PCT: 43 % (ref 12–46)
MCH: 21.8 pg — ABNORMAL LOW (ref 26.0–34.0)
MCHC: 30.5 g/dL (ref 30.0–36.0)
MCV: 71.5 fL — ABNORMAL LOW (ref 78.0–100.0)
Monocytes Absolute: 0.4 10*3/uL (ref 0.1–1.0)
Monocytes Relative: 8 % (ref 3–12)
Neutro Abs: 2.3 10*3/uL (ref 1.7–7.7)
Neutrophils Relative %: 45 % (ref 43–77)
Platelets: 362 10*3/uL (ref 150–400)
RBC: 5.19 MIL/uL — ABNORMAL HIGH (ref 3.87–5.11)
WBC: 5.1 10*3/uL (ref 4.0–10.5)

## 2013-08-03 LAB — VITAMIN D 25 HYDROXY (VIT D DEFICIENCY, FRACTURES): Vit D, 25-Hydroxy: 53 ng/mL (ref 30–89)

## 2013-08-04 ENCOUNTER — Encounter: Payer: Self-pay | Admitting: Nurse Practitioner

## 2013-08-04 ENCOUNTER — Encounter: Payer: Self-pay | Admitting: Family Medicine

## 2013-08-04 LAB — PAP IG W/ RFLX HPV ASCU

## 2013-08-07 ENCOUNTER — Encounter: Payer: Self-pay | Admitting: Nurse Practitioner

## 2013-08-07 DIAGNOSIS — R739 Hyperglycemia, unspecified: Secondary | ICD-10-CM | POA: Insufficient documentation

## 2013-08-07 DIAGNOSIS — E876 Hypokalemia: Secondary | ICD-10-CM | POA: Insufficient documentation

## 2013-08-07 NOTE — Progress Notes (Signed)
   Subjective:    Patient ID: Rhonda Harris, female    DOB: 02/19/1961, 53 y.o.   MRN: [id number]  HPI presents for wellness checkup. No menstrual cycle for 3 years. Married, same partner. No vaginal discharge or pelvic pain. Recently has severe anemia, had EGD and colonoscopy. Gets regular followup with GI specialist. Regular vision and dental care. Limited exercise.    Review of Systems  Constitutional: Negative for activity change, appetite change and fatigue.  HENT: Negative for dental problem, ear pain, sinus pressure and sore throat.   Respiratory: Negative for cough, chest tightness, shortness of breath and wheezing.   Cardiovascular: Negative for chest pain and leg swelling.  Gastrointestinal: Negative for nausea, vomiting, abdominal pain, diarrhea, constipation and blood in stool.  Genitourinary: Negative for dysuria, urgency, frequency, vaginal bleeding, vaginal discharge, enuresis, difficulty urinating, genital sores, menstrual problem and pelvic pain.       Objective:   Physical Exam  Vitals reviewed. Constitutional: She is oriented to person, place, and time. She appears well-developed. No distress.  HENT:  Right Ear: External ear normal.  Left Ear: External ear normal.  Mouth/Throat: Oropharynx is clear and moist.  Neck: Normal range of motion. Neck supple. No tracheal deviation present. No thyromegaly present.  Cardiovascular: Normal rate, regular rhythm and normal heart sounds.  Exam reveals no gallop.   No murmur heard. Pulmonary/Chest: Effort normal and breath sounds normal.  Abdominal: Soft. She exhibits no distension. There is no tenderness.  Genitourinary: Vagina normal and uterus normal. No vaginal discharge found.  External GU: no rash or lesions. Vagina slightly pale and dry. Cervix normal in appearance, no CMT. Ovaries nonpalpable, no obvious masses. Exam limited due to abd girth.  Musculoskeletal: She exhibits no edema.  Lymphadenopathy:    She has no  cervical adenopathy.  Neurological: She is alert and oriented to person, place, and time.  Skin: Skin is warm and dry. No rash noted.  Psychiatric: She has a normal mood and affect. Her behavior is normal.  Breast: no masses. Axillae: no adenopathy.        Assessment & Plan:   Problem List Items Addressed This Visit     Digestive   Celiac disease     Other   Microcytic anemia   Relevant Orders      CBC with Differential (Completed)   Hyperglycemia   Relevant Orders      Basic metabolic panel (Completed)   Hypokalemia   Relevant Orders      Basic metabolic panel (Completed)   Morbid obesity    Other Visit Diagnoses   Well woman exam    -  Primary    Routine general medical examination at a health care facility        Relevant Orders       Vit D  25 hydroxy (rtn osteoporosis monitoring) (Completed)       MM DIGITAL SCREENING BILATERAL    Screening for cervical cancer        Relevant Orders       Pap IG w/ reflex to HPV when ASC-U (Completed)      Recommend healthy diet, weight loss and regular activity. Daily vitamin D and calcium supplementation. Next physical in one year. Return in about 1 year (around 08/03/2014).

## 2013-08-09 ENCOUNTER — Ambulatory Visit (HOSPITAL_COMMUNITY)
Admission: RE | Admit: 2013-08-09 | Discharge: 2013-08-09 | Disposition: A | Discharge status: Home / Self Care | Payer: No Typology Code available for payment source | Source: Ambulatory Visit | Attending: Nurse Practitioner | Admitting: Nurse Practitioner

## 2013-08-09 DIAGNOSIS — Z1231 Encounter for screening mammogram for malignant neoplasm of breast: Secondary | ICD-10-CM | POA: Insufficient documentation

## 2013-08-10 ENCOUNTER — Ambulatory Visit (INDEPENDENT_AMBULATORY_CARE_PROVIDER_SITE_OTHER): Payer: No Typology Code available for payment source | Admitting: Gastroenterology

## 2013-08-10 ENCOUNTER — Encounter: Payer: Self-pay | Admitting: Gastroenterology

## 2013-08-10 ENCOUNTER — Encounter (INDEPENDENT_AMBULATORY_CARE_PROVIDER_SITE_OTHER): Payer: Self-pay

## 2013-08-10 VITALS — BP 139/88 | HR 78 | Temp 97.4°F | Ht 64.0 in | Wt 229.8 lb

## 2013-08-10 DIAGNOSIS — K900 Celiac disease: Secondary | ICD-10-CM

## 2013-08-10 NOTE — Progress Notes (Signed)
Referring Provider: Merlyn Albert, MD Primary Care Physician:  Harlow Asa, MD Primary GI: Dr. Jena Gauss   Chief Complaint  Patient presents with  . Follow-up    HPI:   Rhonda Harris presents today in follow-up after hospital admission for profound IDA but heme negative. EGD/colonoscopy as below. Found to have celiac disease. Most recent Hgb much improved at 11.3. No complaints. Following a gluten free diet. Taking complete multivitamin and iron. Declines nutrition consult. No prior DEXA scan.    Past Medical History  Diagnosis Date  . Esophageal erosions   . GERD (gastroesophageal reflux disease)   . Celiac disease March 2015    Past Surgical History  Procedure Laterality Date  . Tubal ligation    . Colonoscopy with esophagogastroduodenoscopy (egd) N/A 07/12/2013    Dr. Jena Gauss: EGD revealed erosive reflux esophagitis, large hiatal hernia, antral erosions, abnormal duodenum with path revealing partially developed celiac sprue. Celiac serologies normal but felt to be dealing with celiac disease. Colonoscopy with ileal erosions, negative path.     Current Outpatient Prescriptions  Medication Sig Dispense Refill  . diphenoxylate-atropine (LOMOTIL) 2.5-0.025 MG per tablet       . ferrous sulfate (FERROUSUL) 325 (65 FE) MG tablet Take 1 tablet (325 mg total) by mouth daily with breakfast.  30 tablet  3  . Multiple Vitamin (MULTIVITAMIN) tablet Take 1 tablet by mouth daily.      . ondansetron (ZOFRAN-ODT) 4 MG disintegrating tablet       . pantoprazole (PROTONIX) 40 MG tablet Take 1 tablet (40 mg total) by mouth every morning.  30 tablet  2  . sucralfate (CARAFATE) 1 G tablet Take 1 g by mouth 4 (four) times daily -  with meals and at bedtime.       No current facility-administered medications for this visit.    Allergies as of 08/10/2013  . (No Known Allergies)    History   Social History  . Marital Status: Single    Spouse Name: N/A    Number of Children: N/A  . Years  of Education: N/A   Social History Main Topics  . Smoking status: Former Games developer  . Smokeless tobacco: Former Neurosurgeon    Quit date: 07/02/1993  . Alcohol Use: No  . Drug Use: No  . Sexual Activity: None   Other Topics Concern  . None   Social History Narrative  . None    Review of Systems: As mentioned in HPI.   Physical Exam: BP 139/88  Pulse 78  Temp(Src) 97.4 F (36.3 C) (Oral)  Ht 5\' 4"  (1.626 m)  Wt 229 lb 12.8 oz (104.237 kg)  BMI 39.43 kg/m2 General:   Alert and oriented. No distress noted. Pleasant and cooperative.  Heart:  S1, S2 present without murmurs, rubs, or gallops. Regular rate and rhythm. Abdomen:  +BS, soft, non-tender and non-distended. No rebound or guarding. No HSM or masses noted. Msk:  Symmetrical without gross deformities. Normal posture. Extremities:  Without edema. Neurologic:  Alert and  oriented x4;  grossly normal neurologically. Skin:  Intact without significant lesions or rashes. Psych:  Alert and cooperative. Normal mood and affect.   Lab Results  Component Value Date   WBC 5.1 08/02/2013   HGB 11.3* 08/02/2013   HCT 37.1 08/02/2013   MCV 71.5* 08/02/2013   PLT 362 08/02/2013   Lab Results  Component Value Date   IRON 13* 07/10/2013   TIBC 487* 07/10/2013   FERRITIN 6* 07/10/2013  Lab Results  Component Value Date   VITAMINB12 333 07/10/2013   Lab Results  Component Value Date   FOLATE 12.2 07/10/2013   Vit D: 53

## 2013-08-10 NOTE — Patient Instructions (Signed)
We will see you back in the office in 6 months. We will send you blood work to get done in 3 months.   You need to have the pneumonia vaccine through your primary care doctor.   We have also referred you for a bone scan to have as a baseline.   Continue to take iron twice a day and your complete multivitamin daily.   Great job on sticking with the gluten-free diet!

## 2013-08-11 ENCOUNTER — Other Ambulatory Visit: Payer: Self-pay | Admitting: Gastroenterology

## 2013-08-11 ENCOUNTER — Encounter: Payer: Self-pay | Admitting: Gastroenterology

## 2013-08-11 DIAGNOSIS — K900 Celiac disease: Secondary | ICD-10-CM

## 2013-08-11 NOTE — Assessment & Plan Note (Addendum)
53 year old female with newly diagnosed celiac disease, biopsy proven, with significant improvement in Hgb since hospital discharge. No overt signs of GI bleeding. Remains on oral iron supplementation and vitamin supplementation. Recommend DEXA scan, pneumococcal vaccination, and labs to include CBC, iron, and ferritin in 3 months. Return in 6 months. Adherence to gluten-free diet discussed in detail with patient.

## 2013-08-11 NOTE — Progress Notes (Signed)
Patient notified and verbalized understanding of the test results. No further questions. 

## 2013-08-12 NOTE — Progress Notes (Signed)
cc'd to pcp 

## 2013-08-16 ENCOUNTER — Encounter: Payer: Self-pay | Admitting: Family Medicine

## 2013-08-16 ENCOUNTER — Ambulatory Visit (INDEPENDENT_AMBULATORY_CARE_PROVIDER_SITE_OTHER): Payer: No Typology Code available for payment source | Admitting: Family Medicine

## 2013-08-16 ENCOUNTER — Other Ambulatory Visit: Payer: Self-pay

## 2013-08-16 VITALS — BP 128/86 | Ht 64.5 in | Wt 228.2 lb

## 2013-08-16 DIAGNOSIS — R109 Unspecified abdominal pain: Secondary | ICD-10-CM

## 2013-08-16 DIAGNOSIS — K900 Celiac disease: Secondary | ICD-10-CM

## 2013-08-16 DIAGNOSIS — M545 Low back pain, unspecified: Secondary | ICD-10-CM | POA: Insufficient documentation

## 2013-08-16 DIAGNOSIS — D649 Anemia, unspecified: Secondary | ICD-10-CM

## 2013-08-16 DIAGNOSIS — G8929 Other chronic pain: Secondary | ICD-10-CM | POA: Insufficient documentation

## 2013-08-16 LAB — POCT HEMOGLOBIN: HEMOGLOBIN: 11.4 g/dL — AB (ref 12.2–16.2)

## 2013-08-16 NOTE — Patient Instructions (Signed)
Take two iron tabs daily for one more month, then decrease to one dailoy  Stay on the multivit supp  Back Exercises Back exercises help treat and prevent back injuries. The goal of back exercises is to increase the strength of your abdominal and back muscles and the flexibility of your back. These exercises should be started when you no longer have back pain. Back exercises include:  Pelvic Tilt. Lie on your back with your knees bent. Tilt your pelvis until the lower part of your back is against the floor. Hold this position 5 to 10 sec and repeat 5 to 10 times.  Knee to Chest. Pull first 1 knee up against your chest and hold for 20 to 30 seconds, repeat this with the other knee, and then both knees. This may be done with the other leg straight or bent, whichever feels better.  Sit-Ups or Curl-Ups. Bend your knees 90 degrees. Start with tilting your pelvis, and do a partial, slow sit-up, lifting your trunk only 30 to 45 degrees off the floor. Take at least 2 to 3 seconds for each sit-up. Do not do sit-ups with your knees out straight. If partial sit-ups are difficult, simply do the above but with only tightening your abdominal muscles and holding it as directed.  Hip-Lift. Lie on your back with your knees flexed 90 degrees. Push down with your feet and shoulders as you raise your hips a couple inches off the floor; hold for 10 seconds, repeat 5 to 10 times.  Back arches. Lie on your stomach, propping yourself up on bent elbows. Slowly press on your hands, causing an arch in your low back. Repeat 3 to 5 times. Any initial stiffness and discomfort should lessen with repetition over time.  Shoulder-Lifts. Lie face down with arms beside your body. Keep hips and torso pressed to floor as you slowly lift your head and shoulders off the floor. Do not overdo your exercises, especially in the beginning. Exercises may cause you some mild back discomfort which lasts for a few minutes; however, if the pain is  more severe, or lasts for more than 15 minutes, do not continue exercises until you see your caregiver. Improvement with exercise therapy for back problems is slow.  See your caregivers for assistance with developing a proper back exercise program. Document Released: 06/06/2004 Document Revised: 07/22/2011 Document Reviewed: 02/28/2011 [facility] Patient Information 2014 [place], [place].

## 2013-08-16 NOTE — Progress Notes (Signed)
   Subjective:    Patient ID: Rhonda Harris, female    DOB: 1961/04/28, 53 y.o.   MRN: [id number]  HPI Patient arrives for a follow up on abd pain and anemia. Patient had recent CBC that showed  Hemoglobin up to 11.3.   Celiac disease has been confirmed  Hoping that with time things will improve on low gluten.  Patient reports reflux is stable. Compliant meds. No obvious side effects.  Patient reports chronic back pain. Lumbar in nature. Progressive over recent years. Recalls no acute injury. Was told a long time ago she had a degenerative disc. Also told a long time ago that she probably had rheumatoid arthritis.  Strong family history of osteoarthritis within the family.  Results for orders placed in visit on 08/16/13  POCT HEMOGLOBIN      Result Value Ref Range   Hemoglobin 11.4 (*) 12.2 - 16.2 g/dL    Review of Systems No headache no chest pain no back pain no change in bowel habits no blood in stool    Objective:   Physical Exam  Alert no acute distress. Vitals reviewed. H&T normal. Lungs clear. Heart regular in rhythm. Abdomen nontender. Diffuse low back pain to percussion negative straight leg raising hands no substantial degenerative changes      Assessment & Plan:  Impression 1 celiac disease discussed. #2 anemia likely related to one with chronic blood loss. Also. Improved. #3 reflux stable. #4 chronic back pain discussed likely degenerative disease in the lumbar spine nonsurgical. Highly doubt presence of rheumatoid arthritis. Discussed length. Plan back off to one iron tablet daily after a month. Maintain multivitamin. Gluten-free diet discussed. Maintain other meds. Low back exercises strongly encourage. WSL

## 2013-08-20 ENCOUNTER — Other Ambulatory Visit (HOSPITAL_COMMUNITY): Payer: No Typology Code available for payment source

## 2013-08-23 ENCOUNTER — Ambulatory Visit (HOSPITAL_COMMUNITY)
Admission: RE | Admit: 2013-08-23 | Discharge: 2013-08-23 | Disposition: A | Discharge status: Home / Self Care | Payer: No Typology Code available for payment source | Source: Ambulatory Visit | Attending: Gastroenterology | Admitting: Gastroenterology

## 2013-08-23 DIAGNOSIS — K900 Celiac disease: Secondary | ICD-10-CM | POA: Insufficient documentation

## 2013-09-27 ENCOUNTER — Other Ambulatory Visit: Payer: Self-pay

## 2013-09-27 DIAGNOSIS — K900 Celiac disease: Secondary | ICD-10-CM

## 2013-10-24 ENCOUNTER — Encounter: Payer: Self-pay | Admitting: Internal Medicine

## 2013-10-25 LAB — CBC WITH DIFFERENTIAL/PLATELET
BASOS ABS: 0 10*3/uL (ref 0.0–0.1)
Basophils Relative: 1 % (ref 0–1)
Eosinophils Absolute: 0.1 10*3/uL (ref 0.0–0.7)
Eosinophils Relative: 2 % (ref 0–5)
HCT: 44.1 % (ref 36.0–46.0)
Hemoglobin: 14.9 g/dL (ref 12.0–15.0)
Lymphocytes Relative: 38 % (ref 12–46)
Lymphs Abs: 1.5 10*3/uL (ref 0.7–4.0)
MCH: 28.7 pg (ref 26.0–34.0)
MCHC: 33.8 g/dL (ref 30.0–36.0)
MCV: 85 fL (ref 78.0–100.0)
Monocytes Absolute: 0.3 10*3/uL (ref 0.1–1.0)
Monocytes Relative: 7 % (ref 3–12)
NEUTROS ABS: 2 10*3/uL (ref 1.7–7.7)
Neutrophils Relative %: 52 % (ref 43–77)
PLATELETS: 200 10*3/uL (ref 150–400)
RBC: 5.19 MIL/uL — ABNORMAL HIGH (ref 3.87–5.11)
RDW: 14.7 % (ref 11.5–15.5)
WBC: 3.9 10*3/uL — AB (ref 4.0–10.5)

## 2013-10-25 LAB — COMPREHENSIVE METABOLIC PANEL
ALK PHOS: 70 U/L (ref 39–117)
ALT: 13 U/L (ref 0–35)
AST: 19 U/L (ref 0–37)
Albumin: 3.8 g/dL (ref 3.5–5.2)
BILIRUBIN TOTAL: 0.2 mg/dL (ref 0.2–1.2)
BUN: 11 mg/dL (ref 6–23)
CO2: 29 mEq/L (ref 19–32)
Calcium: 9.8 mg/dL (ref 8.4–10.5)
Chloride: 104 mEq/L (ref 96–112)
Creat: 0.81 mg/dL (ref 0.50–1.10)
GLUCOSE: 95 mg/dL (ref 70–99)
Potassium: 5 mEq/L (ref 3.5–5.3)
Sodium: 140 mEq/L (ref 135–145)
Total Protein: 6.4 g/dL (ref 6.0–8.3)

## 2013-10-25 LAB — IRON: Iron: 94 ug/dL (ref 42–145)

## 2013-10-26 LAB — VITAMIN D 25 HYDROXY (VIT D DEFICIENCY, FRACTURES): Vit D, 25-Hydroxy: 62 ng/mL (ref 30–89)

## 2013-10-26 LAB — FOLATE: Folate: >20 ng/mL

## 2013-10-26 LAB — VITAMIN B12: VITAMIN B 12: 281 pg/mL (ref 211–911)

## 2013-10-26 LAB — FERRITIN: FERRITIN: 14 ng/mL (ref 10–291)

## 2013-11-03 NOTE — Progress Notes (Signed)
Quick Note:  Iron levels much better.  B12 and Vit D in normal range. However, I would like her to make sure she takes a complete multivitamin daily She also has osteopenia per DEXA scan report. Needs to take daily Calcium +D.   ______

## 2014-01-13 ENCOUNTER — Encounter: Payer: Self-pay | Admitting: Internal Medicine

## 2014-03-09 ENCOUNTER — Other Ambulatory Visit: Payer: Self-pay | Admitting: Gastroenterology

## 2014-03-09 ENCOUNTER — Encounter: Payer: Self-pay | Admitting: Gastroenterology

## 2014-03-09 ENCOUNTER — Ambulatory Visit (INDEPENDENT_AMBULATORY_CARE_PROVIDER_SITE_OTHER): Payer: No Typology Code available for payment source | Admitting: Gastroenterology

## 2014-03-09 VITALS — BP 130/89 | HR 64 | Temp 98.0°F | Ht 64.5 in | Wt 216.0 lb

## 2014-03-09 DIAGNOSIS — K900 Celiac disease: Secondary | ICD-10-CM

## 2014-03-09 LAB — IRON: Iron: 59 ug/dL (ref 42–145)

## 2014-03-09 LAB — BASIC METABOLIC PANEL
BUN: 12 mg/dL (ref 6–23)
CHLORIDE: 102 meq/L (ref 96–112)
CO2: 24 mEq/L (ref 19–32)
Calcium: 10 mg/dL (ref 8.4–10.5)
Creat: 0.69 mg/dL (ref 0.50–1.10)
Glucose, Bld: 93 mg/dL (ref 70–99)
Potassium: 4.6 mEq/L (ref 3.5–5.3)
SODIUM: 138 meq/L (ref 135–145)

## 2014-03-09 LAB — CBC
HCT: 44.7 % (ref 36.0–46.0)
HEMOGLOBIN: 15.4 g/dL — AB (ref 12.0–15.0)
MCH: 30.4 pg (ref 26.0–34.0)
MCHC: 34.5 g/dL (ref 30.0–36.0)
MCV: 88.3 fL (ref 78.0–100.0)
Platelets: 235 10*3/uL (ref 150–400)
RBC: 5.06 MIL/uL (ref 3.87–5.11)
RDW: 12.8 % (ref 11.5–15.5)
WBC: 4.4 10*3/uL (ref 4.0–10.5)

## 2014-03-09 LAB — FERRITIN: Ferritin: 13 ng/mL (ref 10–291)

## 2014-03-09 NOTE — Progress Notes (Signed)
cc'ed to pcp °

## 2014-03-09 NOTE — Progress Notes (Signed)
    Referring Provider: Merlyn Albert, MD Primary Care Physician:  Harlow Asa, MD Primary GI: Dr. Jena Gauss   Chief Complaint  Patient presents with  . Celiac Disease    HPI:   Rhonda Harris presents today in follow-up with history of profound IDA requiring admission earlier this year. Found to have celiac disease. Hgb normalized now. Iron and ferritin improved as of June 2015. Off iron. Just taking MVI. No abdominal pain, no N/V. Following the gluten-free diet. No complaints. DEXA scan showed osteopenia.    Past Medical History  Diagnosis Date  . Esophageal erosions   . GERD (gastroesophageal reflux disease)   . Celiac disease March 2015    Past Surgical History  Procedure Laterality Date  . Tubal ligation    . Colonoscopy with esophagogastroduodenoscopy (egd) N/A 07/12/2013    Dr. Jena Gauss: EGD revealed erosive reflux esophagitis, large hiatal hernia, antral erosions, abnormal duodenum with path revealing partially developed celiac sprue. Celiac serologies normal but felt to be dealing with celiac disease. Colonoscopy with ileal erosions, negative path.     Current Outpatient Prescriptions  Medication Sig Dispense Refill  . Multiple Vitamin (MULTIVITAMIN) tablet Take 1 tablet by mouth daily.       No current facility-administered medications for this visit.    Allergies as of 03/09/2014  . (No Known Allergies)    Family History  Problem Relation Age of Onset  . Colon cancer Neg Hx     History   Social History  . Marital Status: Married    Spouse Name: N/A    Number of Children: N/A  . Years of Education: N/A   Social History Main Topics  . Smoking status: Former Games developer  . Smokeless tobacco: Former Neurosurgeon    Quit date: 07/02/1993  . Alcohol Use: No  . Drug Use: No  . Sexual Activity: None   Other Topics Concern  . None   Social History Narrative  . None    Review of Systems: As mentioned in HPI  Physical Exam: BP 130/89  Pulse 64  Temp(Src) 98 F  (36.7 C) (Oral)  Ht 5' 4.5" (1.638 m)  Wt 216 lb (97.977 kg)  BMI 36.52 kg/m2 General:   Alert and oriented. No distress noted. Pleasant and cooperative.  Head:  Normocephalic and atraumatic. Heart:  S1, S2 present without murmurs, rubs, or gallops. Regular rate and rhythm. Abdomen:  +BS, soft, non-tender and non-distended. No rebound or guarding. No HSM or masses noted. Msk:  Symmetrical without gross deformities. Normal posture. Extremities:  Without edema. Neurologic:  Alert and  oriented x4;  grossly normal neurologically. Skin:  Intact without significant lesions or rashes. Psych:  Alert and cooperative. Normal mood and affect.  Lab Results  Component Value Date   WBC 3.9* 10/25/2013   HGB 14.9 10/25/2013   HCT 44.1 10/25/2013   MCV 85.0 10/25/2013   PLT 200 10/25/2013   Lab Results  Component Value Date   ALT 13 10/25/2013   AST 19 10/25/2013   ALKPHOS 70 10/25/2013   BILITOT 0.2 10/25/2013   Lab Results  Component Value Date   IRON 94 10/25/2013   TIBC 487* 07/10/2013   FERRITIN 14 10/25/2013

## 2014-03-09 NOTE — Patient Instructions (Signed)
I would like to recheck your blood work today. We will call with the results.   Continue the multivitamin daily. I would also like you to add a supplemental Vit D and calcium. You can find this in the form of Caltrate 600 +D, Oscal, etc, over the counter.   We will see you back in 1 year!

## 2014-03-09 NOTE — Assessment & Plan Note (Signed)
Diagnosed earlier this year after admission for profound IDA. Adhering to gluten-free diet. Hgb, iron, ferritin all improved significantly, normalized. DEXA scan with osteopenia. Continue multivitamin, add Calcium + D, check CBC, iron, ferritin now. Checking albumin at patient's request. Return in 1 year or sooner if needed.

## 2014-03-10 LAB — ALBUMIN: Albumin: 4 g/dL (ref 3.5–5.2)

## 2014-03-10 NOTE — Progress Notes (Signed)
Quick Note:  Albumin order has been added. ( I spoke to [place] in customer service at [facility]). ______

## 2014-03-14 NOTE — Progress Notes (Signed)
Quick Note:  Hgb normal.  Albumin normal at 4.  Iron and ferritin dropping again. Still low normal. Would recommend Iron 325 mg oral daily. ______

## 2014-10-31 ENCOUNTER — Encounter: Payer: Self-pay | Admitting: Nurse Practitioner

## 2014-10-31 ENCOUNTER — Ambulatory Visit (INDEPENDENT_AMBULATORY_CARE_PROVIDER_SITE_OTHER): Payer: No Typology Code available for payment source | Admitting: Nurse Practitioner

## 2014-10-31 VITALS — BP 134/84 | Ht 64.0 in | Wt 216.4 lb

## 2014-10-31 DIAGNOSIS — M8580 Other specified disorders of bone density and structure, unspecified site: Secondary | ICD-10-CM | POA: Diagnosis not present

## 2014-10-31 DIAGNOSIS — Z0000 Encounter for general adult medical examination without abnormal findings: Secondary | ICD-10-CM

## 2014-10-31 DIAGNOSIS — L578 Other skin changes due to chronic exposure to nonionizing radiation: Secondary | ICD-10-CM | POA: Diagnosis not present

## 2014-10-31 DIAGNOSIS — Z13220 Encounter for screening for lipoid disorders: Secondary | ICD-10-CM | POA: Diagnosis not present

## 2014-10-31 DIAGNOSIS — E876 Hypokalemia: Secondary | ICD-10-CM | POA: Diagnosis not present

## 2014-10-31 DIAGNOSIS — Z1329 Encounter for screening for other suspected endocrine disorder: Secondary | ICD-10-CM

## 2014-10-31 DIAGNOSIS — Z1231 Encounter for screening mammogram for malignant neoplasm of breast: Secondary | ICD-10-CM

## 2014-10-31 DIAGNOSIS — D509 Iron deficiency anemia, unspecified: Secondary | ICD-10-CM | POA: Diagnosis not present

## 2014-10-31 NOTE — Progress Notes (Signed)
Subjective:    Patient ID: Rhonda Harris, female    DOB: 1960-09-13, 54 y.o.   MRN: [id number]  HPI presents for her wellness physical. No vaginal bleeding for at least 4 years. Married, same sexual partner. Regular vision and dental exams. Overall healthy diet. Walking at least 3 times per week. 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 Takes daily vitamin D and calcium.     Review of Systems  Constitutional: Negative for fever, activity change, appetite change and fatigue.  HENT: Negative for dental problem, ear pain, sinus pressure and sore throat.   Respiratory: Negative for cough, chest tightness, shortness of breath and wheezing.   Cardiovascular: Negative for chest pain.  Gastrointestinal: Negative for nausea, vomiting, abdominal pain, diarrhea, constipation, blood in stool and abdominal distention.  Genitourinary: Negative for dysuria, urgency, frequency, vaginal bleeding, vaginal discharge, enuresis, difficulty urinating, genital sores and pelvic pain.  Psychiatric/Behavioral:       Denies depression symptoms.        Objective:   Physical Exam  Constitutional: She is oriented to person, place, and time. She appears well-developed. No distress.  HENT:  Right Ear: External ear normal.  Left Ear: External ear normal.  Mouth/Throat: Oropharynx is clear and moist.  Neck: Normal range of motion. Neck supple. No tracheal deviation present. No thyromegaly present.  Cardiovascular: Normal rate, regular rhythm and normal heart sounds.  Exam reveals no gallop.   No murmur heard. Pulmonary/Chest: Effort normal and breath sounds normal.  Abdominal: Soft. She exhibits no distension. There is no tenderness.  Genitourinary: Vagina normal and uterus normal. No vaginal discharge found.  External GU: no rashes or lesions. Vagina: pale, slightly dry; no discharge. Cervix normal in appearance. No CMT. Bimanual exam: no tenderness or obvious masses.   Musculoskeletal: She  exhibits no edema.  Lymphadenopathy:    She has no cervical adenopathy.  Neurological: She is alert and oriented to person, place, and time.  Skin: Skin is warm and dry. No rash noted.  Significant generalized sun damage. Skin is very tan today.  Psychiatric: She has a normal mood and affect. Her behavior is normal.  Vitals reviewed. Breast exam: slightly dense tissue; minimal fine nodularity; no masses; axillae no adenopathy.       Assessment & Plan:   Problem List Items Addressed This Visit      Musculoskeletal and Integument   Sun-damaged skin     Other   Hypokalemia   Relevant Orders   Basic metabolic panel   Microcytic anemia   Relevant Medications   ferrous sulfate 325 (65 FE) MG tablet   Other Relevant Orders   CBC with Differential/Platelet    Other Visit Diagnoses    Routine general medical examination at a health care facility    -  Primary    Relevant Orders    CBC with Differential/Platelet    Lipid panel    Basic metabolic panel    TSH    MM DIGITAL SCREENING BILATERAL    POC Hemoccult Bld/Stl (3-Cd Home Screen)    Screening for thyroid disorder        Relevant Orders    TSH    Visit for screening mammogram        Relevant Orders    MM DIGITAL SCREENING BILATERAL    Screening cholesterol level        Relevant Orders    Lipid panel      Continue activity and weight loss efforts, daily vitamin  D and calcium. Strongly recommend skin cancer screening. Defers at this time.  Return in about 1 year (around 10/31/2015) for physical.

## 2014-11-02 LAB — CBC WITH DIFFERENTIAL/PLATELET
BASOS ABS: 0 10*3/uL (ref 0.0–0.2)
Basos: 1 %
EOS (ABSOLUTE): 0.1 10*3/uL (ref 0.0–0.4)
Eos: 3 %
HEMATOCRIT: 45.1 % (ref 34.0–46.6)
HEMOGLOBIN: 15.6 g/dL (ref 11.1–15.9)
Immature Grans (Abs): 0 10*3/uL (ref 0.0–0.1)
Immature Granulocytes: 0 %
LYMPHS ABS: 1.6 10*3/uL (ref 0.7–3.1)
Lymphs: 32 %
MCH: 30.1 pg (ref 26.6–33.0)
MCHC: 34.6 g/dL (ref 31.5–35.7)
MCV: 87 fL (ref 79–97)
MONOS ABS: 0.5 10*3/uL (ref 0.1–0.9)
Monocytes: 10 %
NEUTROS ABS: 2.8 10*3/uL (ref 1.4–7.0)
Neutrophils: 54 %
Platelets: 265 10*3/uL (ref 150–379)
RBC: 5.18 x10E6/uL (ref 3.77–5.28)
RDW: 12.3 % (ref 12.3–15.4)
WBC: 5.2 10*3/uL (ref 3.4–10.8)

## 2014-11-02 LAB — BASIC METABOLIC PANEL
BUN/Creatinine Ratio: 20 (ref 9–23)
BUN: 17 mg/dL (ref 6–24)
CO2: 23 mmol/L (ref 18–29)
Calcium: 9.8 mg/dL (ref 8.7–10.2)
Chloride: 102 mmol/L (ref 97–108)
Creatinine, Ser: 0.83 mg/dL (ref 0.57–1.00)
GFR calc Af Amer: 94 mL/min/{1.73_m2} (ref >59)
GFR, EST NON AFRICAN AMERICAN: 81 mL/min/{1.73_m2} (ref >59)
Glucose: 95 mg/dL (ref 65–99)
POTASSIUM: 4.3 mmol/L (ref 3.5–5.2)
SODIUM: 140 mmol/L (ref 134–144)

## 2014-11-02 LAB — LIPID PANEL
Chol/HDL Ratio: 3.2 ratio units (ref 0.0–4.4)
Cholesterol, Total: 263 mg/dL — ABNORMAL HIGH (ref 100–199)
HDL: 83 mg/dL (ref >39)
LDL CALC: 159 mg/dL — AB (ref 0–99)
TRIGLYCERIDES: 107 mg/dL (ref 0–149)
VLDL CHOLESTEROL CAL: 21 mg/dL (ref 5–40)

## 2014-11-02 LAB — TSH: TSH: 1.23 u[IU]/mL (ref 0.450–4.500)

## 2014-11-15 ENCOUNTER — Encounter: Payer: Self-pay | Admitting: Nurse Practitioner

## 2014-11-17 ENCOUNTER — Other Ambulatory Visit: Payer: Self-pay

## 2014-11-17 DIAGNOSIS — Z139 Encounter for screening, unspecified: Secondary | ICD-10-CM

## 2014-11-17 DIAGNOSIS — Z0000 Encounter for general adult medical examination without abnormal findings: Secondary | ICD-10-CM

## 2014-11-17 LAB — POC HEMOCCULT BLD/STL (HOME/3-CARD/SCREEN)
Card #2 Fecal Occult Blod, POC: NEGATIVE
Card #3 Fecal Occult Blood, POC: NEGATIVE
FECAL OCCULT BLD: NEGATIVE

## 2014-11-21 ENCOUNTER — Ambulatory Visit (HOSPITAL_COMMUNITY)
Admission: RE | Admit: 2014-11-21 | Discharge: 2014-11-21 | Disposition: A | Discharge status: Home / Self Care | Payer: No Typology Code available for payment source | Source: Ambulatory Visit | Attending: Nurse Practitioner | Admitting: Nurse Practitioner

## 2014-11-21 DIAGNOSIS — Z1231 Encounter for screening mammogram for malignant neoplasm of breast: Secondary | ICD-10-CM | POA: Insufficient documentation

## 2015-01-31 ENCOUNTER — Encounter: Payer: Self-pay | Admitting: Internal Medicine

## 2015-04-21 ENCOUNTER — Encounter: Payer: Self-pay | Admitting: Family Medicine

## 2015-04-21 ENCOUNTER — Ambulatory Visit (INDEPENDENT_AMBULATORY_CARE_PROVIDER_SITE_OTHER): Payer: PRIVATE HEALTH INSURANCE | Admitting: Family Medicine

## 2015-04-21 VITALS — BP 122/78 | Temp 98.6°F | Ht 64.0 in | Wt 220.6 lb

## 2015-04-21 DIAGNOSIS — J329 Chronic sinusitis, unspecified: Secondary | ICD-10-CM

## 2015-04-21 DIAGNOSIS — J310 Chronic rhinitis: Secondary | ICD-10-CM

## 2015-04-21 MED ORDER — AMOXICILLIN-POT CLAVULANATE 875-125 MG PO TABS
1.0000 | ORAL_TABLET | Freq: Two times a day (BID) | ORAL | Status: AC
Start: 2015-04-21 — End: 2015-05-05

## 2015-04-21 NOTE — Progress Notes (Signed)
   Subjective:    Patient ID: Rhonda Harris, female    DOB: 1960-12-22, 54 y.o.   MRN: [id number]  Cough This is a new problem. The current episode started in the past 7 days. Associated symptoms include headaches, myalgias, nasal congestion and a sore throat.   Some prouctive  h a frontal , and ears congested   Fontal h a ;s  Mild dim energy  No fever  Off and on chills  Throbbing h a today,     Review of Systems  HENT: Positive for sore throat.   Respiratory: Positive for cough.   Musculoskeletal: Positive for myalgias.  Neurological: Positive for headaches.       Objective:   Physical Exam  Alert mild malaise hydration good H&T moderate his congestion frontal dysfunctional neck supple lungs clear heart regular in rhythm.      Assessment & Plan:  Impression post viral rhinosinusitis/bronchitis plan antibiotics prescribed. Symptom care discussed warning signs discussed WSL

## 2015-05-17 ENCOUNTER — Encounter: Payer: Self-pay | Admitting: Family Medicine

## 2015-05-17 ENCOUNTER — Ambulatory Visit (INDEPENDENT_AMBULATORY_CARE_PROVIDER_SITE_OTHER): Payer: PRIVATE HEALTH INSURANCE | Admitting: Family Medicine

## 2015-05-17 VITALS — Temp 98.0°F | Ht 64.0 in | Wt 218.6 lb

## 2015-05-17 DIAGNOSIS — J111 Influenza due to unidentified influenza virus with other respiratory manifestations: Secondary | ICD-10-CM

## 2015-05-17 DIAGNOSIS — J069 Acute upper respiratory infection, unspecified: Secondary | ICD-10-CM

## 2015-05-17 NOTE — Progress Notes (Signed)
   Subjective:    Patient ID: Rhonda Harris, female    DOB: May 04, 1961, 55 y.o.   MRN: [id number]  Cough This is a new problem. The current episode started in the past 7 days. Associated symptoms include headaches, nasal congestion, rhinorrhea and a sore throat. Pertinent negatives include no chest pain, ear pain, fever, shortness of breath or wheezing. Associated symptoms comments: Vomiting and diarrhea. Treatments tried: OTC meds.    patient with flulike illness a here with vomiting diarrhea body aches fever chills cough all within a 48-hour span gradually getting better energy starting to pick up still having some weakness unable to work her job   Review of Systems  Constitutional: Negative for fever and activity change.  HENT: Positive for congestion, rhinorrhea and sore throat. Negative for ear pain.   Eyes: Negative for discharge.  Respiratory: Positive for cough. Negative for shortness of breath and wheezing.   Cardiovascular: Negative for chest pain.  Gastrointestinal: Positive for nausea, vomiting and diarrhea.  Neurological: Positive for headaches.       Objective:   Physical Exam  Constitutional: She appears well-developed.  HENT:  Head: Normocephalic.  Nose: Nose normal.  Mouth/Throat: Oropharynx is clear and moist. No oropharyngeal exudate.  Neck: Neck supple.  Cardiovascular: Normal rate and normal heart sounds.   No murmur heard. Pulmonary/Chest: Effort normal and breath sounds normal. She has no wheezes.  Abdominal: Soft.  Lymphadenopathy:    She has no cervical adenopathy.  Skin: Skin is warm and dry.  Nursing note and vitals reviewed.      patient does not appear toxic no respiratory distress    Assessment & Plan:  Influenza-the patient was diagnosed with influenza. Patient/family educated about the flu and warning signs to watch for. If difficulty breathing, severe neck pain and stiffness, cyanosis, disorientation, or progressive worsening then  immediately get rechecked at that ER. If progressive symptoms be certain to be rechecked. Supportive measures such as Tylenol/ibuprofen was discussed. No aspirin use in children. And influenza home care instruction sheet was given.  The patient does not appear toxic. I would not recommend x-rays lab work at this time. If progressive symptoms or worsening symptoms over the next several days please notify us. Patient was warned regarding second illness approach.

## 2015-05-17 NOTE — Patient Instructions (Signed)

## 2015-07-10 ENCOUNTER — Encounter: Payer: Self-pay | Admitting: Gastroenterology

## 2015-07-10 ENCOUNTER — Ambulatory Visit (INDEPENDENT_AMBULATORY_CARE_PROVIDER_SITE_OTHER): Payer: No Typology Code available for payment source | Admitting: Gastroenterology

## 2015-07-10 VITALS — BP 147/90 | HR 71 | Temp 97.8°F | Ht 65.0 in | Wt 217.4 lb

## 2015-07-10 DIAGNOSIS — R197 Diarrhea, unspecified: Secondary | ICD-10-CM

## 2015-07-10 DIAGNOSIS — K900 Celiac disease: Secondary | ICD-10-CM

## 2015-07-10 MED ORDER — ELUXADOLINE 100 MG PO TABS: 1.0000 | ORAL_TABLET | Freq: Two times a day (BID) | ORAL | Status: DC

## 2015-07-10 NOTE — Patient Instructions (Signed)
Please have stool tests done and return to the lab. Please complete blood work today.   Once we get the stool test results, we will let you know if it's ok to start Viberzi or not. I have given the prescription and a copay card to you today, where you get the first month free. When will tell you it's ok, you may start taking it twice a day with food. Please call if no bowel movement in 4 days.   Further recommendations to follow!

## 2015-07-10 NOTE — Progress Notes (Signed)
Referring Provider: Merlyn Albert, MD Primary Care Physician:  Lubertha South, MD  Primary GI: Dr. Jena Gauss   Chief Complaint  Patient presents with  . Abdominal Cramping  . Abdominal Pain  . Follow-up    HPI:   Rhonda Harris is a 55 y.o. female presenting today with a history of profound IDA requiring admission in 2015, found to have celiac disease. Last seen Oct 2015.   Gets sick on stomach in the mornings. Tries to eat some yogurt in the morning so she can take her vitamins. In the last few months, anything she eats makes her sick. Will eat a salad for supper and then an hour later stomach starts cramping and makes her want to cry. Last night had a hamburger with cheese, bacon, and gluten-free bun around 430pm. Then will have loose stool. More diarrhea predominant. Could have anywhere from 5-6 loose stools per day. Having looser stool at least once to twice per day. No rectal bleeding. No N/V. No recent antibiotics.   Past Medical History  Diagnosis Date  . Esophageal erosions   . GERD (gastroesophageal reflux disease)   . Celiac disease March 2015    Past Surgical History  Procedure Laterality Date  . Tubal ligation    . Colonoscopy with esophagogastroduodenoscopy (egd) N/A 07/12/2013    Dr. Jena Gauss: EGD revealed erosive reflux esophagitis, large hiatal hernia, antral erosions, abnormal duodenum with path revealing partially developed celiac sprue. Celiac serologies normal but felt to be dealing with celiac disease. Colonoscopy with ileal erosions, negative path.     Current Outpatient Prescriptions  Medication Sig Dispense Refill  . calcium gluconate 500 MG tablet Take 1 tablet by mouth daily.    . ferrous sulfate 325 (65 FE) MG tablet Take 325 mg by mouth daily with breakfast.    . Multiple Vitamin (MULTIVITAMIN) tablet Take 1 tablet by mouth daily.     No current facility-administered medications for this visit.    Allergies as of 07/10/2015  . (No Known Allergies)      Family History  Problem Relation Age of Onset  . Colon cancer Neg Hx   . Osteoporosis Mother   . Cancer Father 67    pancreatic    Social History   Social History  . Marital Status: Married    Spouse Name: N/A  . Number of Children: N/A  . Years of Education: N/A   Social History Main Topics  . Smoking status: Former Smoker    Quit date: 07/02/1993  . Smokeless tobacco: Never Used  . Alcohol Use: Yes     Comment: rare social   . Drug Use: No  . Sexual Activity: Yes    Birth Control/ Protection: Post-menopausal, Surgical   Other Topics Concern  . None   Social History Narrative    Review of Systems: As mentioned in HPI    Physical Exam: BP 147/90 mmHg  Pulse 71  Temp(Src) 97.8 F (36.6 C)  Ht 5\' 5"  (1.651 m)  Wt 217 lb 6.4 oz (98.612 kg)  BMI 36.18 kg/m2 General:   Alert and oriented. No distress noted. Pleasant and cooperative.  Head:  Normocephalic and atraumatic. Eyes:  Conjuctiva clear without scleral icterus. Abdomen:  +BS, soft, non-tender and non-distended. No rebound or guarding. No HSM or masses noted. Msk:  Symmetrical without gross deformities. Normal posture. Extremities:  Without edema. Neurologic:  Alert and  oriented x4;  grossly normal neurologically. Psych:  Alert and cooperative. Normal mood and affect.

## 2015-07-11 ENCOUNTER — Other Ambulatory Visit: Payer: Self-pay | Admitting: Gastroenterology

## 2015-07-11 LAB — CBC
HCT: 43.9 % (ref 36.0–46.0)
Hemoglobin: 14.3 g/dL (ref 12.0–15.0)
MCH: 28.9 pg (ref 26.0–34.0)
MCHC: 32.6 g/dL (ref 30.0–36.0)
MCV: 88.7 fL (ref 78.0–100.0)
MPV: 11.4 fL (ref 8.6–12.4)
Platelets: 259 10*3/uL (ref 150–400)
RBC: 4.95 MIL/uL (ref 3.87–5.11)
RDW: 13.3 % (ref 11.5–15.5)
WBC: 5.9 10*3/uL (ref 4.0–10.5)

## 2015-07-11 LAB — IRON AND TIBC
%SAT: 19 % (ref 11–50)
IRON: 74 ug/dL (ref 45–160)
TIBC: 390 ug/dL (ref 250–450)
UIBC: 316 ug/dL (ref 125–400)

## 2015-07-11 LAB — FERRITIN: FERRITIN: 27 ng/mL (ref 10–232)

## 2015-07-12 LAB — CLOSTRIDIUM DIFFICILE BY PCR: Toxigenic C. Difficile by PCR: NOT DETECTED

## 2015-07-12 LAB — TISSUE TRANSGLUTAMINASE, IGA: Tissue Transglutaminase Ab, IgA: 1 U/mL (ref <4)

## 2015-07-13 LAB — GIARDIA ANTIGEN

## 2015-07-15 LAB — STOOL CULTURE

## 2015-07-18 DIAGNOSIS — R197 Diarrhea, unspecified: Secondary | ICD-10-CM | POA: Insufficient documentation

## 2015-07-18 NOTE — Assessment & Plan Note (Signed)
55 year old female who continues to adhere to a strict gluten-free diet but now with abdominal pain, diarrhea, that appears to be possibly IBS but need to rule out infectious etiology. Will also need to check celiac serologies, in case she is ingesting gluten inadvertently. Labs ordered as well due to history of IDA. May trial Viberzi samples if all negative.

## 2015-07-18 NOTE — Progress Notes (Signed)
Quick Note:  Stool studies all negative. Celiac serologies normal. Ferritin improved and Hgb normal. No concern for uncontrolled celiac disease. Let's trial Viberzi. I believe I gave her samples. Take BID with food and call if any side effects such as no BM in 3-4 days, new onset abdominal pain that is different from prior, etc. Return in 6 weeks. ______

## 2015-07-18 NOTE — Progress Notes (Signed)
cc'ed to pcp °

## 2015-08-14 ENCOUNTER — Ambulatory Visit (INDEPENDENT_AMBULATORY_CARE_PROVIDER_SITE_OTHER): Payer: No Typology Code available for payment source | Admitting: Gastroenterology

## 2015-08-14 ENCOUNTER — Telehealth: Payer: Self-pay | Admitting: Gastroenterology

## 2015-08-14 ENCOUNTER — Other Ambulatory Visit: Payer: Self-pay | Admitting: Gastroenterology

## 2015-08-14 ENCOUNTER — Encounter: Payer: Self-pay | Admitting: Gastroenterology

## 2015-08-14 VITALS — BP 139/86 | HR 67 | Temp 98.3°F | Ht 65.0 in | Wt 218.6 lb

## 2015-08-14 DIAGNOSIS — K900 Celiac disease: Secondary | ICD-10-CM

## 2015-08-14 DIAGNOSIS — M8580 Other specified disorders of bone density and structure, unspecified site: Secondary | ICD-10-CM

## 2015-08-14 MED ORDER — DICYCLOMINE HCL 10 MG PO CAPS: 10.0000 mg | ORAL_CAPSULE | Freq: Three times a day (TID) | ORAL | Status: DC

## 2015-08-14 NOTE — Telephone Encounter (Signed)
I spoke with Clifton Custard from [employer], he asked that I call the pharmacy and let them know that Shanda Bumps from [facility] will be coming by to help figure out why she her copay is so high. I called the pharmacy, the pt has federal insurance because she works for the [employer] and they cannot use copay cards. I told her I didn't know if I would be able to catch Shanda Bumps before she got to the pharmacy, she said she will tell her when she gets there.

## 2015-08-14 NOTE — Progress Notes (Signed)
Referring Provider: Merlyn Albert, MD Primary Care Physician:  Lubertha South, MD  Primary GI: Dr. Jena Gauss   Chief Complaint  Patient presents with  . Follow-up    HPI:   Rhonda Harris is a 55 y.o. female presenting today with a history of profound IDA requiring admission in 2015, found to have celiac disease. Last seen Feb 2017. At that time, she was noting abdominal cramping and loose stool. Celiac serologies came back normal, negative stool studies, ferritin improved and Hgb normal. Viberzi was trialed. She is here for short-term follow-up.   Viberzi worked very well. Was taking at night and doing well. No rectal bleeding. Insurance does not cover now. Doing much better. Due for DEXA scan now.    Past Medical History  Diagnosis Date  . Esophageal erosions   . GERD (gastroesophageal reflux disease)   . Celiac disease March 2015    Past Surgical History  Procedure Laterality Date  . Tubal ligation    . Colonoscopy with esophagogastroduodenoscopy (egd) N/A 07/12/2013    Dr. Jena Gauss: EGD revealed erosive reflux esophagitis, large hiatal hernia, antral erosions, abnormal duodenum with path revealing partially developed celiac sprue. Celiac serologies normal but felt to be dealing with celiac disease. Colonoscopy with ileal erosions, negative path.     Current Outpatient Prescriptions  Medication Sig Dispense Refill  . calcium gluconate 500 MG tablet Take 1 tablet by mouth daily.    . ferrous sulfate 325 (65 FE) MG tablet Take 325 mg by mouth daily with breakfast.    . Multiple Vitamin (MULTIVITAMIN) tablet Take 1 tablet by mouth daily.    Marland Kitchen dicyclomine (BENTYL) 10 MG capsule Take 1 capsule (10 mg total) by mouth 4 (four) times daily -  before meals and at bedtime. 120 capsule 3   No current facility-administered medications for this visit.    Allergies as of 08/14/2015  . (No Known Allergies)    Family History  Problem Relation Age of Onset  . Colon cancer Neg Hx   .  Osteoporosis Mother   . Cancer Father 92    pancreatic    Social History   Social History  . Marital Status: Married    Spouse Name: N/A  . Number of Children: N/A  . Years of Education: N/A   Social History Main Topics  . Smoking status: Former Smoker    Quit date: 07/02/1993  . Smokeless tobacco: Never Used  . Alcohol Use: Yes     Comment: rare social   . Drug Use: No  . Sexual Activity: Yes    Birth Control/ Protection: Post-menopausal, Surgical   Other Topics Concern  . None   Social History Narrative    Review of Systems: Negative unless mentioned in HPI   Physical Exam: BP 139/86 mmHg  Pulse 67  Temp(Src) 98.3 F (36.8 C) (Oral)  Ht 5\' 5"  (1.651 m)  Wt 218 lb 9.6 oz (99.156 kg)  BMI 36.38 kg/m2 General:   Alert and oriented. No distress noted. Pleasant and cooperative.  Head:  Normocephalic and atraumatic. Eyes:  Conjuctiva clear without scleral icterus. Mouth:  Oral mucosa pink and moist. Good dentition. No lesions. Abdomen:  +BS, soft, non-tender and non-distended. No rebound or guarding. No HSM or masses noted. Msk:  Symmetrical without gross deformities. Normal posture. Extremities:  Without edema. Neurologic:  Alert and  oriented x4;  grossly normal neurologically. Psych:  Alert and cooperative. Normal mood and affect.  Lab Results  Component Value Date  WBC 5.9 07/10/2015   HGB 14.3 07/10/2015   HCT 43.9 07/10/2015   MCV 88.7 07/10/2015   PLT 259 07/10/2015   Lab Results  Component Value Date   IRON 74 07/10/2015   TIBC 390 07/10/2015   FERRITIN 27 07/10/2015

## 2015-08-14 NOTE — Telephone Encounter (Signed)
Can we call [facility] and make sure patient activated the savings card for Viberzi? Her copay shouldn't be 200$. I spoke with the drug rep, and if we can show that this was activated, he can contact the pharmacy to see what the issue is.

## 2015-08-14 NOTE — Assessment & Plan Note (Signed)
Continues to adhere to a strict gluten-free diet. Doing well and due for a DEXA scan now. Appears to have an IBS overlay, doing quite well with Viberzi but not covered by insurance. For this reason, will trial Bentyl prn. If needed, could consider course of Xifaxan for IBS-D. Patient to call if no improvement. DEXA scan to be ordered. Return in Aug 2017.

## 2015-08-14 NOTE — Patient Instructions (Signed)
We have scheduled you for a DEXA scan.  Start taking Bentyl before meals as needed for abdominal cramping and diarrhea.    We will see you in August!

## 2015-08-15 NOTE — Progress Notes (Signed)
CC'ED TO PCP 

## 2015-08-28 ENCOUNTER — Ambulatory Visit (HOSPITAL_COMMUNITY)
Admission: RE | Admit: 2015-08-28 | Discharge: 2015-08-28 | Disposition: A | Discharge status: Home / Self Care | Payer: No Typology Code available for payment source | Source: Ambulatory Visit | Attending: Gastroenterology | Admitting: Gastroenterology

## 2015-08-28 DIAGNOSIS — M8580 Other specified disorders of bone density and structure, unspecified site: Secondary | ICD-10-CM

## 2015-09-12 NOTE — Progress Notes (Signed)
Quick Note:  She has osteopenia. Needs to be taking Calcium 1200 mg daily along with Vit D 800 units daily. I believe she is already taking this. I'm copying to Dr. Gerda Diss as Lorain Childes. Need DEXA in 2 years. ______

## 2015-11-06 ENCOUNTER — Ambulatory Visit (INDEPENDENT_AMBULATORY_CARE_PROVIDER_SITE_OTHER): Payer: PRIVATE HEALTH INSURANCE | Admitting: Nurse Practitioner

## 2015-11-06 ENCOUNTER — Encounter: Payer: Self-pay | Admitting: Nurse Practitioner

## 2015-11-06 ENCOUNTER — Other Ambulatory Visit: Payer: Self-pay | Admitting: Nurse Practitioner

## 2015-11-06 VITALS — BP 118/74 | Ht 63.75 in | Wt 222.0 lb

## 2015-11-06 DIAGNOSIS — L578 Other skin changes due to chronic exposure to nonionizing radiation: Secondary | ICD-10-CM | POA: Diagnosis not present

## 2015-11-06 DIAGNOSIS — M8580 Other specified disorders of bone density and structure, unspecified site: Secondary | ICD-10-CM

## 2015-11-06 DIAGNOSIS — Z0000 Encounter for general adult medical examination without abnormal findings: Secondary | ICD-10-CM

## 2015-11-06 DIAGNOSIS — Z1231 Encounter for screening mammogram for malignant neoplasm of breast: Secondary | ICD-10-CM

## 2015-11-06 LAB — HEPATIC FUNCTION PANEL
ALT: 11 U/L (ref 6–29)
AST: 15 U/L (ref 10–35)
Albumin: 3.8 g/dL (ref 3.6–5.1)
Alkaline Phosphatase: 59 U/L (ref 33–130)
BILIRUBIN DIRECT: 0.1 mg/dL (ref <=0.2)
BILIRUBIN INDIRECT: 0.2 mg/dL (ref 0.2–1.2)
BILIRUBIN TOTAL: 0.3 mg/dL (ref 0.2–1.2)
Total Protein: 6.3 g/dL (ref 6.1–8.1)

## 2015-11-06 LAB — BASIC METABOLIC PANEL
BUN: 12 mg/dL (ref 7–25)
CO2: 26 mmol/L (ref 20–31)
Calcium: 9.1 mg/dL (ref 8.6–10.4)
Chloride: 107 mmol/L (ref 98–110)
Creat: 0.68 mg/dL (ref 0.50–1.05)
GLUCOSE: 92 mg/dL (ref 65–99)
Potassium: 4.3 mmol/L (ref 3.5–5.3)
SODIUM: 140 mmol/L (ref 135–146)

## 2015-11-06 LAB — LIPID PANEL
CHOLESTEROL: 235 mg/dL — AB (ref 125–200)
HDL: 83 mg/dL (ref >=46)
LDL Cholesterol: 134 mg/dL — ABNORMAL HIGH (ref <130)
Total CHOL/HDL Ratio: 2.8 Ratio (ref <=5.0)
Triglycerides: 89 mg/dL (ref <150)
VLDL: 18 mg/dL (ref <30)

## 2015-11-07 ENCOUNTER — Encounter: Payer: Self-pay | Admitting: Nurse Practitioner

## 2015-11-07 LAB — VITAMIN D 25 HYDROXY (VIT D DEFICIENCY, FRACTURES): Vit D, 25-Hydroxy: 33 ng/mL (ref 30–100)

## 2015-11-07 NOTE — Progress Notes (Signed)
   Subjective:    Patient ID: Rhonda Harris, female    DOB: 17-Aug-1960, 55 y.o.   MRN: [id number]  HPI presents for her wellness exam. No vaginal bleeding or pelvic pain. Same sexual partner. Regular vision exams. Needs dental exam. Very active job with regular walking.     Review of Systems  Constitutional: Negative for activity change, appetite change and fatigue.  HENT: Negative for dental problem, ear pain, sinus pressure and sore throat.   Respiratory: Negative for cough, chest tightness, shortness of breath and wheezing.   Cardiovascular: Negative for chest pain.  Gastrointestinal: Negative for nausea, vomiting, abdominal pain, diarrhea, constipation, blood in stool and abdominal distention.  Genitourinary: Negative for dysuria, urgency, frequency, vaginal bleeding, vaginal discharge, enuresis, difficulty urinating, genital sores and pelvic pain.       Objective:   Physical Exam  Constitutional: She is oriented to person, place, and time. She appears well-developed. No distress.  HENT:  Right Ear: External ear normal.  Left Ear: External ear normal.  Mouth/Throat: Oropharynx is clear and moist.  Neck: Normal range of motion. Neck supple. No tracheal deviation present. No thyromegaly present.  Cardiovascular: Normal rate, regular rhythm and normal heart sounds.  Exam reveals no gallop.   No murmur heard. Pulmonary/Chest: Effort normal and breath sounds normal.  Abdominal: Soft. She exhibits no distension. There is no tenderness.  Genitourinary: Vagina normal and uterus normal. No vaginal discharge found.  External GU: no rashes or lesions. Vagina: pale with signs of hypoestrogenism. Cervix normal in appearance. No discharge. No CMT. Bimanual exam: no tenderness or obvious masses. Exam limited due to abd girth. Rectal exam: no masses; no stool for hemoccult.   Musculoskeletal: She exhibits no edema.  Mild thoracic kyphosis noted.   Lymphadenopathy:    She has no cervical  adenopathy.  Neurological: She is alert and oriented to person, place, and time.  Skin: Skin is warm and dry. No rash noted.  Significant sun damage noted.   Psychiatric: She has a normal mood and affect. Her behavior is normal.  Vitals reviewed. Breast exam: no masses; axillae no adenopathy.  Last DEXA 08/28/15.      Assessment & Plan:   Problem List Items Addressed This Visit      Musculoskeletal and Integument   Osteopenia   Relevant Orders   VITAMIN D 25 Hydroxy (Vit-D Deficiency, Fractures)   Sun-damaged skin    Other Visit Diagnoses    Routine general medical examination at a health care facility    -  Primary    Relevant Orders    Lipid panel    Hepatic function panel    Basic metabolic panel    VITAMIN D 25 Hydroxy (Vit-D Deficiency, Fractures)    Encounter for screening mammogram for breast cancer        Relevant Orders    MM DIGITAL SCREENING BILATERAL      Recommend skin cancer screening with dermatology, daily vitamin D/calcium supplement and weight loss.  Return in about 1 year (around 11/05/2016) for physical.

## 2015-11-27 ENCOUNTER — Ambulatory Visit (HOSPITAL_COMMUNITY): Payer: No Typology Code available for payment source

## 2015-12-18 ENCOUNTER — Other Ambulatory Visit: Payer: Self-pay | Admitting: Nurse Practitioner

## 2015-12-18 ENCOUNTER — Other Ambulatory Visit: Payer: Self-pay | Admitting: Gastroenterology

## 2015-12-18 ENCOUNTER — Encounter (HOSPITAL_COMMUNITY): Payer: Self-pay

## 2015-12-18 ENCOUNTER — Ambulatory Visit (HOSPITAL_COMMUNITY): Admission: RE | Admit: 2015-12-18 | Payer: No Typology Code available for payment source | Source: Ambulatory Visit

## 2015-12-18 ENCOUNTER — Encounter: Payer: Self-pay | Admitting: Gastroenterology

## 2015-12-18 ENCOUNTER — Ambulatory Visit (HOSPITAL_COMMUNITY)
Admission: RE | Admit: 2015-12-18 | Discharge: 2015-12-18 | Disposition: A | Discharge status: Home / Self Care | Payer: No Typology Code available for payment source | Source: Ambulatory Visit | Attending: Nurse Practitioner | Admitting: Nurse Practitioner

## 2015-12-18 ENCOUNTER — Ambulatory Visit (INDEPENDENT_AMBULATORY_CARE_PROVIDER_SITE_OTHER): Payer: No Typology Code available for payment source | Admitting: Gastroenterology

## 2015-12-18 VITALS — BP 132/83 | HR 67 | Temp 98.2°F | Ht 64.0 in | Wt 227.4 lb

## 2015-12-18 DIAGNOSIS — K900 Celiac disease: Secondary | ICD-10-CM

## 2015-12-18 DIAGNOSIS — Z1231 Encounter for screening mammogram for malignant neoplasm of breast: Secondary | ICD-10-CM | POA: Insufficient documentation

## 2015-12-18 DIAGNOSIS — K2100 Gastro-esophageal reflux disease with esophagitis, without bleeding: Secondary | ICD-10-CM

## 2015-12-18 MED ORDER — PANTOPRAZOLE SODIUM 40 MG PO TBEC: 40.0000 mg | DELAYED_RELEASE_TABLET | Freq: Every day | ORAL | 3 refills | Status: DC

## 2015-12-18 NOTE — Assessment & Plan Note (Signed)
On EGD in 2015. Intermittent reflux noted. Start Protonix. Return in 1 year or sooner if needed.

## 2015-12-18 NOTE — Progress Notes (Signed)
Referring Provider: Merlyn Albert, MD Primary Care Physician:  Lubertha South, MD  Primary GI: Dr. Jena Gauss   Chief Complaint  Patient presents with  . Follow-up    Doing well     HPI:   Rhonda Harris is a 55 y.o. female presenting today with a history of profound IDA requiring hospital admission in 2015, found to have celiac disease. Last seen April 2017. Felt to have an IBS overlay and prescribed Viberzi at a previous visit, but insurance did not cover this. Bentyl was then prescribed. DEXA scan April 2017 showed osteopenia. Taking Calcium with VIt D. Will need Dexa again in 2019.   Bentyl is taken only as needed. Takes maybe only 2 a day at the most. With insurance only costs about a dollar at the most. No abdominal pain. No diarrhea. Every once in awhile will have some abdominal cramping. Ate a hamburger on a gluten-free bun with slaw and onions, then home made sherbert (with cool whip) and had some discomfort, which upset her stomach later. No rectal bleeding. Having some reflux intermittently and not on a PPI. No dysphagia. Loose stool is very rare. Overall doing quite well.   Past Medical History:  Diagnosis Date  . Celiac disease March 2015  . Esophageal erosions   . GERD (gastroesophageal reflux disease)     Past Surgical History:  Procedure Laterality Date  . COLONOSCOPY WITH ESOPHAGOGASTRODUODENOSCOPY (EGD) N/A 07/12/2013   Dr. Jena Gauss: EGD revealed erosive reflux esophagitis, large hiatal hernia, antral erosions, abnormal duodenum with path revealing partially developed celiac sprue. Celiac serologies normal but felt to be dealing with celiac disease. Colonoscopy with ileal erosions, negative path.   . TUBAL LIGATION      Current Outpatient Prescriptions  Medication Sig Dispense Refill  . calcium gluconate 500 MG tablet Take 1 tablet by mouth daily.    Marland Kitchen dicyclomine (BENTYL) 10 MG capsule Take 1 capsule (10 mg total) by mouth 4 (four) times daily -  before meals and at  bedtime. 120 capsule 3  . ferrous sulfate 325 (65 FE) MG tablet Take 325 mg by mouth daily with breakfast.    . Multiple Vitamin (MULTIVITAMIN) tablet Take 1 tablet by mouth daily.     No current facility-administered medications for this visit.     Allergies as of 12/18/2015  . (No Known Allergies)    Family History  Problem Relation Age of Onset  . Colon cancer Neg Hx   . Osteoporosis Mother   . Cancer Father 14    pancreatic    Social History   Social History  . Marital status: Married    Spouse name: N/A  . Number of children: N/A  . Years of education: N/A   Social History Main Topics  . Smoking status: Former Smoker    Quit date: 07/02/1993  . Smokeless tobacco: Never Used     Comment: only smoked about 2 years as teenager  . Alcohol use Yes     Comment: rare social   . Drug use: No  . Sexual activity: Yes    Birth control/ protection: Post-menopausal, Surgical   Other Topics Concern  . None   Social History Narrative  . None    Review of Systems: Negative as mentioned HPI.   Physical Exam: BP 132/83   Pulse 67   Temp 98.2 F (36.8 C) (Oral)   Ht 5\' 4"  (1.626 m)   Wt 227 lb 6.4 oz (103.1 kg)   BMI  39.03 kg/m  General:   Alert and oriented. No distress noted. Pleasant and cooperative.  Head:  Normocephalic and atraumatic. Eyes:  Conjuctiva clear without scleral icterus. Abdomen:  +BS, soft, non-tender and non-distended. No rebound or guarding. No HSM or masses noted. Extremities:  Without edema. Neurologic:  Alert and  oriented x4;  grossly normal neurologically. Psych:  Alert and cooperative. Normal mood and affect.  Lab Results  Component Value Date   WBC 5.9 07/10/2015   HGB 14.3 07/10/2015   HCT 43.9 07/10/2015   MCV 88.7 07/10/2015   PLT 259 07/10/2015   Lab Results  Component Value Date   ALT 11 11/06/2015   AST 15 11/06/2015   ALKPHOS 59 11/06/2015   BILITOT 0.3 11/06/2015   Lab Results  Component Value Date   CREATININE  0.68 11/06/2015   BUN 12 11/06/2015   NA 140 11/06/2015   K 4.3 11/06/2015   CL 107 11/06/2015   CO2 26 11/06/2015

## 2015-12-18 NOTE — Patient Instructions (Signed)
Continue taking Bentyl as needed.   I have sent in Protonix for reflux to take once each morning, 30 minutes before breakfast. Do this for one month then you can return to as needed. If reflux returns, you may need to take daily.  We will see you in 1 year or sooner if needed!

## 2015-12-18 NOTE — Progress Notes (Signed)
cc'ed to pcp °

## 2015-12-18 NOTE — Assessment & Plan Note (Signed)
55 year old female doing well and continuing to adhere to a strict gluten-free diet. DEXA scan up-to-date and due again in 2019. Osteopenia noted on DEXA, with calcium and Vit D supplementation daily on board. Followed closely by PCP. Appears to have mild IBS overlay, taking Bentyl occasionally and doing overall quite well. Return in 1 year.

## 2016-03-27 ENCOUNTER — Encounter: Payer: Self-pay | Admitting: Family Medicine

## 2016-03-27 ENCOUNTER — Ambulatory Visit (INDEPENDENT_AMBULATORY_CARE_PROVIDER_SITE_OTHER): Payer: No Typology Code available for payment source | Admitting: Nurse Practitioner

## 2016-03-27 ENCOUNTER — Encounter: Payer: Self-pay | Admitting: Nurse Practitioner

## 2016-03-27 VITALS — BP 128/74 | Temp 98.2°F | Ht 64.0 in | Wt 227.0 lb

## 2016-03-27 DIAGNOSIS — J069 Acute upper respiratory infection, unspecified: Secondary | ICD-10-CM | POA: Diagnosis not present

## 2016-03-27 DIAGNOSIS — J029 Acute pharyngitis, unspecified: Secondary | ICD-10-CM | POA: Diagnosis not present

## 2016-03-27 MED ORDER — AZITHROMYCIN 250 MG PO TABS: ORAL_TABLET | ORAL | 0 refills | Status: DC

## 2016-03-27 NOTE — Progress Notes (Signed)
Subjective:  Presents with complaints of sore throat for the past few days. Worse yesterday. Over the past 24 hours has also developed headache Raynaud's frequent cough muscle aches and ear pressure. Mild upper abdominal pain at times. Has noticed a flareup of her reflux with her illness. States it was "bad" last night. Continues to take Protonix daily. No vomiting or diarrhea. Taking fluids well. Voiding normal limit. No known fever. States her son was sick with a similar illness and has been on amoxicillin.  Objective:   BP 128/74   Temp 98.2 F (36.8 C) (Oral)   Ht 5\' 4"  (1.626 m)   Wt 227 lb (103 kg)   BMI 38.96 kg/m  NAD. Alert, oriented. Fatigued in appearance. TMs clear effusion, no erythema. Pharynx mild erythema, slight enlargement of the tonsils with a few tiny pockets of white exudate noted. Neck supple with mild soft tender anterior adenopathy. Lungs clear. Heart regular rate rhythm. Abdomen soft nondistended with minimal epigastric area discomfort.  Assessment: Exudative pharyngitis  Acute upper respiratory infection  Plan:  Meds ordered this encounter  Medications  . azithromycin (ZITHROMAX Z-PAK) 250 MG tablet    Sig: Take 2 tablets (500 mg) on  Day 1,  followed by 1 tablet (250 mg) once daily on Days 2 through 5.    Dispense:  6 each    Refill:  0    Order Specific Question:   Supervising Provider    Answer:   [[id number]]   OTC meds as directed for symptomatic care. Warning signs reviewed. Call back in 48 hours if no improvement, sooner if worse. Also continue Protonix for her acid reflux symptoms.

## 2016-04-09 ENCOUNTER — Telehealth: Payer: Self-pay | Admitting: Family Medicine

## 2016-04-09 MED ORDER — AMOXICILLIN-POT CLAVULANATE 875-125 MG PO TABS: ORAL_TABLET | ORAL | 0 refills | Status: DC

## 2016-04-09 NOTE — Telephone Encounter (Signed)
Spoke with patient and informed her per Dr.Steve Luking- we are sending in Augmentin 875 take 1 tablet by mouth twice a day for 10 days. Patient verbalized understanding.

## 2016-04-09 NOTE — Telephone Encounter (Signed)
Aug 875 bid ten d 

## 2016-04-09 NOTE — Telephone Encounter (Signed)
Patient seen on 03/27/16 by Hoyle Sauer for exudative pharyngitis.  She said she is still having sore throat, head congestion, cough, stopped up.  She wants to know if we can call something else in?  [place]

## 2016-04-19 ENCOUNTER — Telehealth: Payer: Self-pay | Admitting: Family Medicine

## 2016-04-19 MED ORDER — CEFPROZIL 500 MG PO TABS: ORAL_TABLET | ORAL | 0 refills | Status: DC

## 2016-04-19 NOTE — Telephone Encounter (Signed)
Spoke with patient and informed her per Dr.Scott Luking- Cefzil 500 mg twice a day for 7 days if this does not take care the problem the patient will need to come in and be seen rather then further medicines called in. Patient verbalized understanding.

## 2016-04-19 NOTE — Telephone Encounter (Signed)
Cefzil 500 mg twice a day for 7 days if this does not take care the problem the patient will need to come in and be seen rather then further medicines called in

## 2016-04-19 NOTE — Telephone Encounter (Signed)
Pt called stating that she is taking her last dose of amoxicillin today and that her sore throat has still not gone away. Please advise.

## 2016-05-09 ENCOUNTER — Telehealth: Payer: Self-pay | Admitting: Family Medicine

## 2016-05-09 NOTE — Telephone Encounter (Signed)
Discussed with pt. Pt verbalized understanding.  °

## 2016-05-09 NOTE — Telephone Encounter (Signed)
insur companies are getting very grumpy about covering mre's pt will have to speak specialist who is injecting her arm

## 2016-05-09 NOTE — Telephone Encounter (Signed)
Pt is wanting to know if she can get an mri ordered for the issues that she continues to have with her arm. Pt states that she has had three shots in that arm and therapy is not working. Please advise.

## 2016-05-10 ENCOUNTER — Other Ambulatory Visit (HOSPITAL_COMMUNITY): Payer: Self-pay | Admitting: Sports Medicine

## 2016-05-10 DIAGNOSIS — M25532 Pain in left wrist: Secondary | ICD-10-CM

## 2016-05-15 ENCOUNTER — Ambulatory Visit (HOSPITAL_COMMUNITY)
Admission: RE | Admit: 2016-05-15 | Discharge: 2016-05-15 | Disposition: A | Discharge status: Home / Self Care | Payer: No Typology Code available for payment source | Source: Ambulatory Visit | Attending: Sports Medicine | Admitting: Sports Medicine

## 2016-05-15 DIAGNOSIS — M25532 Pain in left wrist: Secondary | ICD-10-CM | POA: Diagnosis present

## 2016-11-14 ENCOUNTER — Encounter: Payer: Self-pay | Admitting: Internal Medicine

## 2017-01-06 ENCOUNTER — Other Ambulatory Visit: Payer: Self-pay

## 2017-01-06 MED ORDER — PANTOPRAZOLE SODIUM 40 MG PO TBEC: 40.0000 mg | DELAYED_RELEASE_TABLET | Freq: Every day | ORAL | 3 refills | Status: DC

## 2017-01-28 LAB — BASIC METABOLIC PANEL: Glucose: 107

## 2017-07-15 ENCOUNTER — Telehealth: Payer: Self-pay | Admitting: Internal Medicine

## 2017-07-15 NOTE — Telephone Encounter (Signed)
Letter mailed

## 2017-07-15 NOTE — Telephone Encounter (Signed)
RECALL FOR REPEAT DEXA SCAN

## 2017-08-08 ENCOUNTER — Encounter: Payer: Self-pay | Admitting: *Deleted

## 2017-08-08 ENCOUNTER — Telehealth: Payer: Self-pay | Admitting: *Deleted

## 2017-08-08 ENCOUNTER — Other Ambulatory Visit: Payer: Self-pay | Admitting: *Deleted

## 2017-08-08 DIAGNOSIS — M8580 Other specified disorders of bone density and structure, unspecified site: Secondary | ICD-10-CM

## 2017-08-08 NOTE — Telephone Encounter (Signed)
Patient was in the office and scheduled Dexa for 08/29/17 at 2:15pm. Patient was given instructions. Checked [employer] for patient insurance and no PA is required

## 2017-08-29 ENCOUNTER — Ambulatory Visit (HOSPITAL_COMMUNITY)
Admission: RE | Admit: 2017-08-29 | Discharge: 2017-08-29 | Disposition: A | Discharge status: Home / Self Care | Payer: No Typology Code available for payment source | Source: Ambulatory Visit | Attending: Gastroenterology | Admitting: Gastroenterology

## 2017-08-29 DIAGNOSIS — M85852 Other specified disorders of bone density and structure, left thigh: Secondary | ICD-10-CM | POA: Insufficient documentation

## 2017-08-29 DIAGNOSIS — M8580 Other specified disorders of bone density and structure, unspecified site: Secondary | ICD-10-CM | POA: Diagnosis present

## 2017-09-03 NOTE — Progress Notes (Signed)
Still with osteopenia, some worsening from before. Please have sent to PCP and let patient know that she may need to discuss therapy options.

## 2017-10-15 ENCOUNTER — Other Ambulatory Visit: Payer: Self-pay

## 2017-10-15 ENCOUNTER — Emergency Department (HOSPITAL_COMMUNITY)
Admission: EM | Admit: 2017-10-15 | Discharge: 2017-10-15 | Disposition: A | Discharge status: Home / Self Care | Attending: Emergency Medicine | Admitting: Emergency Medicine

## 2017-10-15 ENCOUNTER — Encounter (HOSPITAL_COMMUNITY): Payer: Self-pay | Admitting: Emergency Medicine

## 2017-10-15 DIAGNOSIS — Y9389 Activity, other specified: Secondary | ICD-10-CM | POA: Diagnosis not present

## 2017-10-15 DIAGNOSIS — Z79899 Other long term (current) drug therapy: Secondary | ICD-10-CM | POA: Diagnosis not present

## 2017-10-15 DIAGNOSIS — W540XXA Bitten by dog, initial encounter: Secondary | ICD-10-CM | POA: Diagnosis not present

## 2017-10-15 DIAGNOSIS — Z23 Encounter for immunization: Secondary | ICD-10-CM | POA: Insufficient documentation

## 2017-10-15 DIAGNOSIS — S81851A Open bite, right lower leg, initial encounter: Secondary | ICD-10-CM

## 2017-10-15 DIAGNOSIS — Z87891 Personal history of nicotine dependence: Secondary | ICD-10-CM | POA: Insufficient documentation

## 2017-10-15 DIAGNOSIS — Y92007 Garden or yard of unspecified non-institutional (private) residence as the place of occurrence of the external cause: Secondary | ICD-10-CM | POA: Diagnosis not present

## 2017-10-15 DIAGNOSIS — Y990 Civilian activity done for income or pay: Secondary | ICD-10-CM | POA: Insufficient documentation

## 2017-10-15 MED ORDER — ONDANSETRON HCL 4 MG PO TABS
4.0000 mg | ORAL_TABLET | Freq: Once | ORAL | Status: AC
  Administered 2017-10-15: 4 mg via ORAL
  Filled 2017-10-15: qty 1

## 2017-10-15 MED ORDER — AMOXICILLIN-POT CLAVULANATE 875-125 MG PO TABS
1.0000 | ORAL_TABLET | Freq: Once | ORAL | Status: AC
Start: 2017-10-15 — End: 2017-10-15
  Administered 2017-10-15: 1 via ORAL
  Filled 2017-10-15: qty 1

## 2017-10-15 MED ORDER — TETANUS-DIPHTH-ACELL PERTUSSIS 5-2.5-18.5 LF-MCG/0.5 IM SUSP
0.5000 mL | Freq: Once | INTRAMUSCULAR | Status: AC
  Administered 2017-10-15: 0.5 mL via INTRAMUSCULAR
  Filled 2017-10-15: qty 0.5

## 2017-10-15 MED ORDER — BACITRACIN-NEOMYCIN-POLYMYXIN 400-5-5000 EX OINT
TOPICAL_OINTMENT | Freq: Once | CUTANEOUS | Status: AC
  Administered 2017-10-15: 1 via TOPICAL
  Filled 2017-10-15: qty 1

## 2017-10-15 MED ORDER — AMOXICILLIN-POT CLAVULANATE 875-125 MG PO TABS: 1.0000 | ORAL_TABLET | Freq: Two times a day (BID) | ORAL | 0 refills | Status: DC

## 2017-10-15 NOTE — ED Triage Notes (Signed)
Pt bit by dog today. States dog had rabies shot in 2016. Small lac noted to right post thigh with bleeding controlled. Nad. This is workers comp per pt

## 2017-10-15 NOTE — ED Notes (Signed)
Patient with dog bite to the R posterior calf. Animal control was notified. Patient states the dog had a rabies vaccine in 2016 that was good for 1 year.

## 2017-10-15 NOTE — ED Provider Notes (Signed)
[facility] EMERGENCY DEPARTMENT Provider Note   CSN: [id number] Arrival date & time: 10/15/17  1713     History   Chief Complaint Chief Complaint  Patient presents with  . Animal Bite    HPI Rhonda Harris is a 57 y.o. female.  Patient is a 57 year old female who presents to the emergency department with a dog bite to the right calf.  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  She states that after delivering a package to someone's home, and getting back into her vehicle, the dog came and bit her on the right calf.  The animal control team was called.  They will observe the dog at the person's home.  The ulnar states that the dog had rabies vaccine in 2016 that was good for 1year.  The patient states she is not up-to-date on her tetanus at this time.  The patient denies having any medical conditions that would affect her immune system.  She was able to control the bleeding by applying pressure.  She presents to the emergency department for assistance with these issues.     Past Medical History:  Diagnosis Date  . Celiac disease March 2015  . Esophageal erosions   . GERD (gastroesophageal reflux disease)     Patient Active Problem List   Diagnosis Date Noted  . Diarrhea 07/18/2015  . Sun-damaged skin 10/31/2014  . Osteopenia 10/31/2014  . Chronic low back pain 08/16/2013  . Hyperglycemia 08/07/2013  . Hypokalemia 08/07/2013  . Morbid obesity (HCC) 08/07/2013  . Celiac disease 08/02/2013  . Anemia 07/11/2013  . Microcytic anemia 07/10/2013  . Dysphagia 07/10/2013  . Abdominal pain 07/10/2013  . Reflux esophagitis 07/03/2013  . Exposure to hepatitis C 07/03/2013    Past Surgical History:  Procedure Laterality Date  . COLONOSCOPY WITH ESOPHAGOGASTRODUODENOSCOPY (EGD) N/A 07/12/2013   Dr. Jena Gauss: EGD revealed erosive reflux esophagitis, large hiatal hernia, antral erosions, abnormal duodenum with path revealing partially developed celiac sprue. Celiac serologies normal but felt to  be dealing with celiac disease. Colonoscopy with ileal erosions, negative path.   . TUBAL LIGATION       OB History   None      Home Medications    Prior to Admission medications   Medication Sig Start Date End Date Taking? Authorizing Provider  amoxicillin-clavulanate (AUGMENTIN) 875-125 MG tablet Take 1 tablet by mouth twice a day for 10 days. 04/09/16   Merlyn Albert, MD  azithromycin (ZITHROMAX Z-PAK) 250 MG tablet Take 2 tablets (500 mg) on  Day 1,  followed by 1 tablet (250 mg) once daily on Days 2 through 5. 03/27/16   Campbell Riches, NP  calcium gluconate 500 MG tablet Take 1 tablet by mouth daily.    [provider]  cefPROZIL (CEFZIL) 500 MG tablet Take 1 tablet by mouth twice a day for 7 days. 04/19/16   Babs Sciara, MD  dicyclomine (BENTYL) 10 MG capsule Take 1 capsule (10 mg total) by mouth 4 (four) times daily -  before meals and at bedtime. 08/14/15   Gelene Mink, NP  ferrous sulfate 325 (65 FE) MG tablet Take 325 mg by mouth daily with breakfast.    [provider]  Multiple Vitamin (MULTIVITAMIN) tablet Take 1 tablet by mouth daily.    [provider]  pantoprazole (PROTONIX) 40 MG tablet Take 1 tablet (40 mg total) by mouth daily. Take 30 minutes before breakfast 01/06/17   Gelene Mink, NP  Family History Family History  Problem Relation Age of Onset  . Osteoporosis Mother   . Cancer Father 21       pancreatic  . Colon cancer Neg Hx     Social History Social History   Tobacco Use  . Smoking status: Former Smoker    Last attempt to quit: 07/02/1993    Years since quitting: 24.3  . Smokeless tobacco: Never Used  . Tobacco comment: only smoked about 2 years as teenager  Substance Use Topics  . Alcohol use: Yes    Comment: rare social   . Drug use: No     Allergies   Patient has no known allergies.   Review of Systems Review of Systems  Constitutional: Negative for activity change.       All ROS Neg except  as noted in HPI  HENT: Negative for nosebleeds.   Eyes: Negative for photophobia and discharge.  Respiratory: Negative for cough, shortness of breath and wheezing.   Cardiovascular: Negative for chest pain and palpitations.  Gastrointestinal: Negative for abdominal pain and blood in stool.  Genitourinary: Negative for dysuria, frequency and hematuria.  Musculoskeletal: Negative for arthralgias, back pain and neck pain.  Skin: Positive for wound.       Wound to the right calf  Neurological: Negative for dizziness, seizures and speech difficulty.  Psychiatric/Behavioral: Negative for confusion and hallucinations.     Physical Exam Updated Vital Signs BP (!) 147/90   Pulse 81   Temp 99.2 F (37.3 C) (Oral)   Resp 19   Ht 5\' 3"  (1.6 m)   Wt 107.3 kg (236 lb 8 oz)   SpO2 98%   BMI 41.89 kg/m   Physical Exam  Constitutional: She is oriented to person, place, and time. She appears well-developed and well-nourished.  Non-toxic appearance.  HENT:  Head: Normocephalic.  Right Ear: Tympanic membrane and external ear normal.  Left Ear: Tympanic membrane and external ear normal.  Eyes: Pupils are equal, round, and reactive to light. EOM and lids are normal.  Neck: Normal range of motion. Neck supple. Carotid bruit is not present.  Cardiovascular: Normal rate, regular rhythm, normal heart sounds, intact distal pulses and normal pulses.  Pulmonary/Chest: Breath sounds normal. No respiratory distress.  Abdominal: Soft. Bowel sounds are normal. There is no tenderness. There is no guarding.  Musculoskeletal: Normal range of motion.       Right lower leg: She exhibits tenderness and laceration.       Legs: Lymphadenopathy:       Head (right side): No submandibular adenopathy present.       Head (left side): No submandibular adenopathy present.    She has no cervical adenopathy.  Neurological: She is alert and oriented to person, place, and time. She has normal strength. No cranial nerve  deficit or sensory deficit.  Skin: Skin is warm and dry.  Psychiatric: She has a normal mood and affect. Her speech is normal.  Nursing note and vitals reviewed.    ED Treatments / Results  Labs (all labs ordered are listed, but only abnormal results are displayed) Labs Reviewed - No data to display  EKG None  Radiology No results found.  Procedures Procedures (including critical care time)  Medications Ordered in ED Medications - No data to display   Initial Impression / Assessment and Plan / ED Course  I have reviewed the triage vital signs and the nursing notes.  Pertinent labs & imaging results that were available during my  care of the patient were reviewed by me and considered in my medical decision making (see chart for details).       Final Clinical Impressions(s) / ED Diagnoses MDM  Patient sustained a bite to the  right calf by a dog that had received rabies vaccine in 2016 only.  The animal control team is observing the abnormal at the dog's residents.  The patient states that her tetanus status is not up-to-date, and this was updated today.  The patient has a non-full-thickness laceration to the right calf.  Neosporin dressing was applied.  I discussed with the patient the importance of washing the wound daily and applying dressing until the wound has healed.  Prescription for Augmentin given to the patient.  The patient is to use this daily with food.  Questions were answered.  I have asked the patient to follow-up with the primary physician or return to the emergency department if any signs of infection.  To return to the emergency department if it is found that there are any problems with the dog.  Patient is in agreement with this plan.  Patient feels that she can return to work without problem.   Final diagnoses:  Dog bite of right calf, initial encounter    ED Discharge Orders    None       Ivery Quale, PA-C 10/15/17 1951    Maia Plan,  MD 10/15/17 2257

## 2017-10-15 NOTE — Discharge Instructions (Addendum)
You sustained a dog bite to the calf of the right leg.  This is not a full-thickness laceration.  Your tetanus status was updated today.  Please notify your physician so that it can be made a part of your medical records.  Please apply Neosporin bandage daily until it heals.  Please use Augmentin 2 times daily with food.  It may be helpful to have a container of yogurt daily to protect against infection in the colon, and yeast infection.  This medication may cause loose stools.  Please stay in touch with the animal control team so that they may keep you informed concerning the rabies status of the dog that bit you.

## 2017-12-10 ENCOUNTER — Encounter: Payer: Self-pay | Admitting: Nurse Practitioner

## 2017-12-10 ENCOUNTER — Ambulatory Visit (INDEPENDENT_AMBULATORY_CARE_PROVIDER_SITE_OTHER): Payer: No Typology Code available for payment source | Admitting: Nurse Practitioner

## 2017-12-10 VITALS — BP 128/84 | Ht 63.0 in | Wt 231.0 lb

## 2017-12-10 DIAGNOSIS — Z1151 Encounter for screening for human papillomavirus (HPV): Secondary | ICD-10-CM | POA: Diagnosis not present

## 2017-12-10 DIAGNOSIS — L578 Other skin changes due to chronic exposure to nonionizing radiation: Secondary | ICD-10-CM

## 2017-12-10 DIAGNOSIS — Z1231 Encounter for screening mammogram for malignant neoplasm of breast: Secondary | ICD-10-CM | POA: Diagnosis not present

## 2017-12-10 DIAGNOSIS — Z124 Encounter for screening for malignant neoplasm of cervix: Secondary | ICD-10-CM

## 2017-12-10 DIAGNOSIS — Z0000 Encounter for general adult medical examination without abnormal findings: Secondary | ICD-10-CM

## 2017-12-10 NOTE — Progress Notes (Signed)
Subjective:    Patient ID: Rhonda Harris, female    DOB: February 25, 1961, 57 y.o.   MRN: [id number]  HPI presents for her wellness exam.  Has started a new diet program about 2 weeks ago, has already lost about 5 pounds.  No vaginal bleeding or pelvic pain.  Same sexual partner.  Has her mammogram scheduled for tomorrow.  Is due for her routine blood work.  Regular vision and dental exams.    Review of Systems  Constitutional: Negative for activity change, appetite change and fatigue.  HENT: Negative for dental problem, ear pain, sinus pressure and sore throat.   Respiratory: Negative for cough, chest tightness, shortness of breath and wheezing.   Cardiovascular: Negative for chest pain.  Gastrointestinal: Negative for abdominal distention, abdominal pain, blood in stool, constipation, diarrhea, nausea and vomiting.  Genitourinary: Negative for difficulty urinating, dysuria, enuresis, frequency, genital sores, pelvic pain, urgency, vaginal bleeding and vaginal discharge.   Depression screen PHQ 2/9 12/10/2017  Decreased Interest 0  Down, Depressed, Hopeless 0  PHQ - 2 Score 0        Objective:   Physical Exam  Constitutional: She is oriented to person, place, and time. She appears well-developed. No distress.  HENT:  Right Ear: External ear normal.  Left Ear: External ear normal.  Mouth/Throat: Oropharynx is clear and moist.  Neck: Normal range of motion. Neck supple. No tracheal deviation present. No thyromegaly present.  Cardiovascular: Normal rate, regular rhythm and normal heart sounds. Exam reveals no gallop.  No murmur heard. Pulmonary/Chest: Effort normal and breath sounds normal. Right breast exhibits no inverted nipple, no mass, no skin change and no tenderness. Left breast exhibits no inverted nipple, no mass, no skin change and no tenderness. Breasts are symmetrical.  Axillae no adenopathy.  Abdominal: Soft. She exhibits no distension. There is no tenderness.    Genitourinary: Vagina normal and uterus normal. No vaginal discharge found.  Genitourinary Comments: External GU no rashes or lesions.  Vagina no discharge.  Cervix normal limit in appearance.  No CMT.  Bimanual exam no tenderness or obvious masses, exam limited due to abdominal girth.  Musculoskeletal: She exhibits no edema.  Lymphadenopathy:    She has no cervical adenopathy.  Neurological: She is alert and oriented to person, place, and time.  Skin: Skin is warm and dry. No rash noted.  Significant sun damage noted.  Psychiatric: She has a normal mood and affect. Her behavior is normal.          Assessment & Plan:   Problem List Items Addressed This Visit      Musculoskeletal and Integument   Sun-damaged skin    Other Visit Diagnoses    Routine general medical examination at a health care facility    -  Primary   Relevant Orders   Pap IG and HPV (high risk) DNA detection   Lipid panel   Comprehensive metabolic panel   VITAMIN D 25 Hydroxy (Vit-D Deficiency, Fractures)   Encounter for screening mammogram for breast cancer       Relevant Orders   MM DIGITAL SCREENING BILATERAL   Screening for cervical cancer       Relevant Orders   Pap IG and HPV (high risk) DNA detection   Screening for HPV (human papillomavirus)       Relevant Orders   Pap IG and HPV (high risk) DNA detection     Encouraged continued healthy diet regular activity and weight loss efforts.  Recommend skin  cancer screening through dermatology sometime this year. Return in about 1 year (around 12/11/2018) for physical.

## 2017-12-11 ENCOUNTER — Ambulatory Visit (HOSPITAL_COMMUNITY)
Admission: RE | Admit: 2017-12-11 | Discharge: 2017-12-11 | Disposition: A | Discharge status: Home / Self Care | Payer: No Typology Code available for payment source | Source: Ambulatory Visit | Attending: Nurse Practitioner | Admitting: Nurse Practitioner

## 2017-12-11 ENCOUNTER — Other Ambulatory Visit: Payer: Self-pay | Admitting: Nurse Practitioner

## 2017-12-11 ENCOUNTER — Encounter (HOSPITAL_COMMUNITY): Payer: Self-pay

## 2017-12-11 DIAGNOSIS — Z1231 Encounter for screening mammogram for malignant neoplasm of breast: Secondary | ICD-10-CM | POA: Diagnosis present

## 2017-12-12 LAB — COMPLETE METABOLIC PANEL WITH GFR
AG RATIO: 1.6 (calc) (ref 1.0–2.5)
ALT: 16 U/L (ref 6–29)
AST: 22 U/L (ref 10–35)
Albumin: 4.1 g/dL (ref 3.6–5.1)
Alkaline phosphatase (APISO): 56 U/L (ref 33–130)
BILIRUBIN TOTAL: 0.2 mg/dL (ref 0.2–1.2)
BUN: 17 mg/dL (ref 7–25)
CALCIUM: 9.2 mg/dL (ref 8.6–10.4)
CO2: 27 mmol/L (ref 20–32)
CREATININE: 0.88 mg/dL (ref 0.50–1.05)
Chloride: 107 mmol/L (ref 98–110)
GFR, EST AFRICAN AMERICAN: 86 mL/min/{1.73_m2} (ref >=60)
GFR, EST NON AFRICAN AMERICAN: 74 mL/min/{1.73_m2} (ref >=60)
GLOBULIN: 2.5 g/dL (ref 1.9–3.7)
Glucose, Bld: 98 mg/dL (ref 65–99)
Potassium: 4.6 mmol/L (ref 3.5–5.3)
Sodium: 140 mmol/L (ref 135–146)
TOTAL PROTEIN: 6.6 g/dL (ref 6.1–8.1)

## 2017-12-12 LAB — LIPID PANEL
CHOL/HDL RATIO: 2.8 (calc) (ref <5.0)
CHOLESTEROL: 179 mg/dL (ref <200)
HDL: 63 mg/dL (ref >50)
LDL Cholesterol (Calc): 102 mg/dL (calc) — ABNORMAL HIGH
NON-HDL CHOLESTEROL (CALC): 116 mg/dL (ref <130)
TRIGLYCERIDES: 56 mg/dL (ref <150)

## 2017-12-12 LAB — VITAMIN D 25 HYDROXY (VIT D DEFICIENCY, FRACTURES): VIT D 25 HYDROXY: 31 ng/mL (ref 30–100)

## 2017-12-14 LAB — PAP IG AND HPV HIGH-RISK
HPV, HIGH-RISK: NEGATIVE
PAP Smear Comment: 0

## 2017-12-31 ENCOUNTER — Encounter: Payer: Self-pay | Admitting: *Deleted

## 2018-01-30 ENCOUNTER — Telehealth: Payer: Self-pay | Admitting: *Deleted

## 2018-01-30 ENCOUNTER — Ambulatory Visit (INDEPENDENT_AMBULATORY_CARE_PROVIDER_SITE_OTHER): Payer: No Typology Code available for payment source | Admitting: Gastroenterology

## 2018-01-30 ENCOUNTER — Other Ambulatory Visit: Payer: Self-pay | Admitting: *Deleted

## 2018-01-30 ENCOUNTER — Encounter: Payer: Self-pay | Admitting: Gastroenterology

## 2018-01-30 ENCOUNTER — Encounter: Payer: Self-pay | Admitting: *Deleted

## 2018-01-30 VITALS — BP 136/84 | HR 87 | Temp 97.3°F | Ht 64.0 in | Wt 234.8 lb

## 2018-01-30 DIAGNOSIS — K449 Diaphragmatic hernia without obstruction or gangrene: Secondary | ICD-10-CM

## 2018-01-30 DIAGNOSIS — K2100 Gastro-esophageal reflux disease with esophagitis, without bleeding: Secondary | ICD-10-CM

## 2018-01-30 DIAGNOSIS — R1013 Epigastric pain: Secondary | ICD-10-CM

## 2018-01-30 DIAGNOSIS — K900 Celiac disease: Secondary | ICD-10-CM

## 2018-01-30 NOTE — Patient Instructions (Addendum)
1. Upper endoscopy as scheduled. See separate instructions.  2. Stop pantoprazole for now. HOLD ON TO YOUR MEDICATION IN CASE WE GO BACK TO THIS ONE.  3. Start Dexilant once daily before breakfast. Two weeks of samples provided. If it works a lot better than pantoprazole, please call for [employer].

## 2018-01-30 NOTE — Telephone Encounter (Signed)
Called patient insurance and was advised no PA is required for EGD. Ref #[id number]

## 2018-01-30 NOTE — Assessment & Plan Note (Signed)
Seems to be doing okay, on a strict gluten-free diet.  Bone density study done earlier in the year with ongoing osteopenia.  Patient did follow-up with PCP at this time continuing vitamin supplements.  Continue gluten-free diet.

## 2018-01-30 NOTE — Assessment & Plan Note (Signed)
Postprandial epigastric pain, radiating into the left arm, intermittent vomiting, terrible heartburn.  She is known to have a large hiatal hernia which may be driving factor for the symptoms.  Likely with reflux esophagitis, cannot exclude Rhonda Harris lesions or peptic ulcer disease. Recommend EGD +/- ED (recent dysphagia) with Dr. Gala Romney.  I have discussed the risks, alternatives, benefits with regards to but not limited to the risk of reaction to medication, bleeding, infection, perforation and the patient is agreeable to proceed. Written consent to be obtained.  Switch pantoprazole to Dexilant. Samples provided.

## 2018-01-30 NOTE — Progress Notes (Signed)
CC'ED TO PCP 

## 2018-01-30 NOTE — H&P (View-Only) (Signed)
Primary Care Physician:  Merlyn Albert, MD  Primary Gastroenterologist:  Roetta Sessions, MD   Chief Complaint  Patient presents with  . Celiac Disease  . Abdominal Pain    upper abd when she eats, pain radiates to left arm/shoulder  . Gastroesophageal Reflux    HPI:  Rhonda Harris is a 57 y.o. female here for further evaluation of abdominal pain and dysphagia.  She was last seen in August 2017.  She has a history of celiac disease.  Also felt to have IBS overlay with diarrhea.  She complains of one year h/o epigastric pain radiating into left arm with meals. Goes from hand to shoulder. Seems to settle into the shoulder. She applies pressure in that area and stops eating. 15 minutes later it goes away. Only happens while eating.  Constant heartburn. Especially feels at night. Eating a lot of TUMS. Has to sleep on back. If turns to side then feels like going to throw up. Thursday really bad. Vomited several times. Throat became really raw and had hard time swallowing. Somewhat better today. 9 times out of 10 when she eats she has epigastric pain. Doesn't matter what she eats. She is on strict gluten free diet. BMs are fine. No melena, brbpr.    Current Outpatient Medications  Medication Sig Dispense Refill  . calcium carbonate (TUMS - DOSED IN MG ELEMENTAL CALCIUM) 500 MG chewable tablet Chew 1 tablet by mouth as needed for indigestion or heartburn.    . calcium gluconate 500 MG tablet Take 1 tablet by mouth daily.    . ferrous sulfate 325 (65 FE) MG tablet Take 325 mg by mouth daily with breakfast.    . Multiple Vitamin (MULTIVITAMIN) tablet Take 1 tablet by mouth daily.    . pantoprazole (PROTONIX) 40 MG tablet Take 1 tablet (40 mg total) by mouth daily. Take 30 minutes before breakfast 90 tablet 3   No current facility-administered medications for this visit.     Allergies as of 01/30/2018  . (No Known Allergies)    Past Medical History:  Diagnosis Date  . Celiac disease March  2015  . Esophageal erosions   . GERD (gastroesophageal reflux disease)     Past Surgical History:  Procedure Laterality Date  . COLONOSCOPY WITH ESOPHAGOGASTRODUODENOSCOPY (EGD) N/A 07/12/2013   Dr. Jena Gauss: EGD revealed erosive reflux esophagitis, large hiatal hernia, antral erosions, abnormal duodenum with path revealing partially developed celiac sprue. Celiac serologies normal but felt to be dealing with celiac disease. Colonoscopy with ileal erosions, negative path.   . TUBAL LIGATION      Family History  Problem Relation Age of Onset  . Osteoporosis Mother   . Cancer Father 2       pancreatic  . Colon cancer Neg Hx     Social History   Socioeconomic History  . Marital status: Married    Spouse name: Not on file  . Number of children: Not on file  . Years of education: Not on file  . Highest education level: Not on file  Occupational History  . Not on file  Social Needs  . Financial resource strain: Not on file  . Food insecurity:    Worry: Not on file    Inability: Not on file  . Transportation needs:    Medical: Not on file    Non-medical: Not on file  Tobacco Use  . Smoking status: Former Smoker    Last attempt to quit: 07/02/1993    Years since  quitting: 24.5  . Smokeless tobacco: Never Used  . Tobacco comment: only smoked about 2 years as teenager  Substance and Sexual Activity  . Alcohol use: Yes    Comment: rare social   . Drug use: No  . Sexual activity: Yes    Birth control/protection: Post-menopausal, Surgical  Lifestyle  . Physical activity:    Days per week: Not on file    Minutes per session: Not on file  . Stress: Not on file  Relationships  . Social connections:    Talks on phone: Not on file    Gets together: Not on file    Attends religious service: Not on file    Active member of club or organization: Not on file    Attends meetings of clubs or organizations: Not on file    Relationship status: Not on file  . Intimate partner violence:     Fear of current or ex partner: Not on file    Emotionally abused: Not on file    Physically abused: Not on file    Forced sexual activity: Not on file  Other Topics Concern  . Not on file  Social History Narrative  . Not on file      ROS:  General: Negative for anorexia, weight loss, fever, chills, fatigue, weakness. Eyes: Negative for vision changes.  ENT: Negative for hoarseness,   nasal congestion. See hpi CV: Negative for chest pain, angina, palpitations, dyspnea on exertion, peripheral edema.  Respiratory: Negative for dyspnea at rest, dyspnea on exertion, cough, sputum, wheezing.  GI: See history of present illness. GU:  Negative for dysuria, hematuria, urinary incontinence, urinary frequency, nocturnal urination.  MS: Negative for joint pain, low back pain.  Derm: Negative for rash or itching.  Neuro: Negative for weakness, abnormal sensation, seizure, frequent headaches, memory loss, confusion.  Psych: Negative for anxiety, depression, suicidal ideation, hallucinations.  Endo: Negative for unusual weight change.  Heme: Negative for bruising or bleeding. Allergy: Negative for rash or hives.    Physical Examination:  BP 136/84   Pulse 87   Temp (!) 97.3 F (36.3 C) (Oral)   Ht 5\' 4"  (1.626 m)   Wt 234 lb 12.8 oz (106.5 kg)   BMI 40.30 kg/m    General: Well-nourished, well-developed in no acute distress.  Head: Normocephalic, atraumatic.   Eyes: Conjunctiva pink, no icterus. Mouth: Oropharyngeal mucosa moist and pink , no lesions erythema or exudate. Neck: Supple without thyromegaly, masses, or lymphadenopathy.  Lungs: Clear to auscultation bilaterally.  Heart: Regular rate and rhythm, no murmurs rubs or gallops.  Abdomen: Bowel sounds are normal, nontender, nondistended, no hepatosplenomegaly or masses, no abdominal bruits or    hernia , no rebound or guarding.   Rectal: not performed Extremities: No lower extremity edema. No clubbing or deformities.   Neuro: Alert and oriented x 4 , grossly normal neurologically.  Skin: Warm and dry, no rash or jaundice.   Psych: Alert and cooperative, normal mood and affect.  Labs: Lab Results  Component Value Date   CREATININE 0.88 12/11/2017   BUN 17 12/11/2017   NA 140 12/11/2017   K 4.6 12/11/2017   CL 107 12/11/2017   CO2 27 12/11/2017   Lab Results  Component Value Date   ALT 16 12/11/2017   AST 22 12/11/2017   ALKPHOS 59 11/06/2015   BILITOT 0.2 12/11/2017   Lab Results  Component Value Date   WBC 5.9 07/10/2015   HGB 14.3 07/10/2015   HCT 43.9  07/10/2015   MCV 88.7 07/10/2015   PLT 259 07/10/2015     Imaging Studies: No results found.

## 2018-01-30 NOTE — Progress Notes (Signed)
Primary Care Physician:  Merlyn Albert, MD  Primary Gastroenterologist:  Roetta Sessions, MD   Chief Complaint  Patient presents with  . Celiac Disease  . Abdominal Pain    upper abd when she eats, pain radiates to left arm/shoulder  . Gastroesophageal Reflux    HPI:  Rhonda Harris is a 57 y.o. female here for further evaluation of abdominal pain and dysphagia.  She was last seen in August 2017.  She has a history of celiac disease.  Also felt to have IBS overlay with diarrhea.  She complains of one year h/o epigastric pain radiating into left arm with meals. Goes from hand to shoulder. Seems to settle into the shoulder. She applies pressure in that area and stops eating. 15 minutes later it goes away. Only happens while eating.  Constant heartburn. Especially feels at night. Eating a lot of TUMS. Has to sleep on back. If turns to side then feels like going to throw up. Thursday really bad. Vomited several times. Throat became really raw and had hard time swallowing. Somewhat better today. 9 times out of 10 when she eats she has epigastric pain. Doesn't matter what she eats. She is on strict gluten free diet. BMs are fine. No melena, brbpr.    Current Outpatient Medications  Medication Sig Dispense Refill  . calcium carbonate (TUMS - DOSED IN MG ELEMENTAL CALCIUM) 500 MG chewable tablet Chew 1 tablet by mouth as needed for indigestion or heartburn.    . calcium gluconate 500 MG tablet Take 1 tablet by mouth daily.    . ferrous sulfate 325 (65 FE) MG tablet Take 325 mg by mouth daily with breakfast.    . Multiple Vitamin (MULTIVITAMIN) tablet Take 1 tablet by mouth daily.    . pantoprazole (PROTONIX) 40 MG tablet Take 1 tablet (40 mg total) by mouth daily. Take 30 minutes before breakfast 90 tablet 3   No current facility-administered medications for this visit.     Allergies as of 01/30/2018  . (No Known Allergies)    Past Medical History:  Diagnosis Date  . Celiac disease March  2015  . Esophageal erosions   . GERD (gastroesophageal reflux disease)     Past Surgical History:  Procedure Laterality Date  . COLONOSCOPY WITH ESOPHAGOGASTRODUODENOSCOPY (EGD) N/A 07/12/2013   Dr. Jena Gauss: EGD revealed erosive reflux esophagitis, large hiatal hernia, antral erosions, abnormal duodenum with path revealing partially developed celiac sprue. Celiac serologies normal but felt to be dealing with celiac disease. Colonoscopy with ileal erosions, negative path.   . TUBAL LIGATION      Family History  Problem Relation Age of Onset  . Osteoporosis Mother   . Cancer Father 2       pancreatic  . Colon cancer Neg Hx     Social History   Socioeconomic History  . Marital status: Married    Spouse name: Not on file  . Number of children: Not on file  . Years of education: Not on file  . Highest education level: Not on file  Occupational History  . Not on file  Social Needs  . Financial resource strain: Not on file  . Food insecurity:    Worry: Not on file    Inability: Not on file  . Transportation needs:    Medical: Not on file    Non-medical: Not on file  Tobacco Use  . Smoking status: Former Smoker    Last attempt to quit: 07/02/1993    Years since  quitting: 24.5  . Smokeless tobacco: Never Used  . Tobacco comment: only smoked about 2 years as teenager  Substance and Sexual Activity  . Alcohol use: Yes    Comment: rare social   . Drug use: No  . Sexual activity: Yes    Birth control/protection: Post-menopausal, Surgical  Lifestyle  . Physical activity:    Days per week: Not on file    Minutes per session: Not on file  . Stress: Not on file  Relationships  . Social connections:    Talks on phone: Not on file    Gets together: Not on file    Attends religious service: Not on file    Active member of club or organization: Not on file    Attends meetings of clubs or organizations: Not on file    Relationship status: Not on file  . Intimate partner violence:     Fear of current or ex partner: Not on file    Emotionally abused: Not on file    Physically abused: Not on file    Forced sexual activity: Not on file  Other Topics Concern  . Not on file  Social History Narrative  . Not on file      ROS:  General: Negative for anorexia, weight loss, fever, chills, fatigue, weakness. Eyes: Negative for vision changes.  ENT: Negative for hoarseness,   nasal congestion. See hpi CV: Negative for chest pain, angina, palpitations, dyspnea on exertion, peripheral edema.  Respiratory: Negative for dyspnea at rest, dyspnea on exertion, cough, sputum, wheezing.  GI: See history of present illness. GU:  Negative for dysuria, hematuria, urinary incontinence, urinary frequency, nocturnal urination.  MS: Negative for joint pain, low back pain.  Derm: Negative for rash or itching.  Neuro: Negative for weakness, abnormal sensation, seizure, frequent headaches, memory loss, confusion.  Psych: Negative for anxiety, depression, suicidal ideation, hallucinations.  Endo: Negative for unusual weight change.  Heme: Negative for bruising or bleeding. Allergy: Negative for rash or hives.    Physical Examination:  BP 136/84   Pulse 87   Temp (!) 97.3 F (36.3 C) (Oral)   Ht 5' 4" (1.626 m)   Wt 234 lb 12.8 oz (106.5 kg)   BMI 40.30 kg/m    General: Well-nourished, well-developed in no acute distress.  Head: Normocephalic, atraumatic.   Eyes: Conjunctiva pink, no icterus. Mouth: Oropharyngeal mucosa moist and pink , no lesions erythema or exudate. Neck: Supple without thyromegaly, masses, or lymphadenopathy.  Lungs: Clear to auscultation bilaterally.  Heart: Regular rate and rhythm, no murmurs rubs or gallops.  Abdomen: Bowel sounds are normal, nontender, nondistended, no hepatosplenomegaly or masses, no abdominal bruits or    hernia , no rebound or guarding.   Rectal: not performed Extremities: No lower extremity edema. No clubbing or deformities.   Neuro: Alert and oriented x 4 , grossly normal neurologically.  Skin: Warm and dry, no rash or jaundice.   Psych: Alert and cooperative, normal mood and affect.  Labs: Lab Results  Component Value Date   CREATININE 0.88 12/11/2017   BUN 17 12/11/2017   NA 140 12/11/2017   K 4.6 12/11/2017   CL 107 12/11/2017   CO2 27 12/11/2017   Lab Results  Component Value Date   ALT 16 12/11/2017   AST 22 12/11/2017   ALKPHOS 59 11/06/2015   BILITOT 0.2 12/11/2017   Lab Results  Component Value Date   WBC 5.9 07/10/2015   HGB 14.3 07/10/2015   HCT 43.9   07/10/2015   MCV 88.7 07/10/2015   PLT 259 07/10/2015     Imaging Studies: No results found.

## 2018-02-04 ENCOUNTER — Ambulatory Visit (HOSPITAL_COMMUNITY)
Admission: RE | Admit: 2018-02-04 | Discharge: 2018-02-04 | Disposition: A | Discharge status: Home / Self Care | Payer: No Typology Code available for payment source | Source: Ambulatory Visit | Attending: Internal Medicine | Admitting: Internal Medicine

## 2018-02-04 ENCOUNTER — Encounter (HOSPITAL_COMMUNITY): Payer: Self-pay | Admitting: *Deleted

## 2018-02-04 ENCOUNTER — Encounter (HOSPITAL_COMMUNITY)
Admission: RE | Disposition: A | Discharge status: Home / Self Care | Payer: Self-pay | Source: Ambulatory Visit | Attending: Internal Medicine

## 2018-02-04 ENCOUNTER — Other Ambulatory Visit: Payer: Self-pay

## 2018-02-04 DIAGNOSIS — K222 Esophageal obstruction: Secondary | ICD-10-CM

## 2018-02-04 DIAGNOSIS — K2100 Gastro-esophageal reflux disease with esophagitis, without bleeding: Secondary | ICD-10-CM

## 2018-02-04 DIAGNOSIS — K449 Diaphragmatic hernia without obstruction or gangrene: Secondary | ICD-10-CM

## 2018-02-04 DIAGNOSIS — K900 Celiac disease: Secondary | ICD-10-CM

## 2018-02-04 DIAGNOSIS — Z87891 Personal history of nicotine dependence: Secondary | ICD-10-CM | POA: Insufficient documentation

## 2018-02-04 DIAGNOSIS — Z79899 Other long term (current) drug therapy: Secondary | ICD-10-CM | POA: Diagnosis not present

## 2018-02-04 DIAGNOSIS — R1310 Dysphagia, unspecified: Secondary | ICD-10-CM | POA: Insufficient documentation

## 2018-02-04 DIAGNOSIS — R1013 Epigastric pain: Secondary | ICD-10-CM

## 2018-02-04 DIAGNOSIS — K3189 Other diseases of stomach and duodenum: Secondary | ICD-10-CM | POA: Diagnosis not present

## 2018-02-04 HISTORY — PX: ESOPHAGOGASTRODUODENOSCOPY: SHX5428

## 2018-02-04 HISTORY — PX: MALONEY DILATION: SHX5535

## 2018-02-04 HISTORY — PX: BIOPSY: SHX5522

## 2018-02-04 SURGERY — EGD (ESOPHAGOGASTRODUODENOSCOPY)
Anesthesia: Moderate Sedation

## 2018-02-04 MED ORDER — MIDAZOLAM HCL 5 MG/5ML IJ SOLN
INTRAMUSCULAR | Status: DC | PRN
  Administered 2018-02-04: 1 mg via INTRAVENOUS
  Administered 2018-02-04 (×2): 2 mg via INTRAVENOUS
  Administered 2018-02-04 (×2): 1 mg via INTRAVENOUS

## 2018-02-04 MED ORDER — MEPERIDINE HCL 50 MG/ML IJ SOLN
INTRAMUSCULAR | Status: AC
  Filled 2018-02-04: qty 1

## 2018-02-04 MED ORDER — MIDAZOLAM HCL 5 MG/5ML IJ SOLN
INTRAMUSCULAR | Status: AC
  Filled 2018-02-04: qty 5

## 2018-02-04 MED ORDER — STERILE WATER FOR IRRIGATION IR SOLN
Status: DC | PRN
  Administered 2018-02-04: 100 mL

## 2018-02-04 MED ORDER — LIDOCAINE VISCOUS HCL 2 % MT SOLN
OROMUCOSAL | Status: DC | PRN
  Administered 2018-02-04: 4 mL via OROMUCOSAL

## 2018-02-04 MED ORDER — ONDANSETRON HCL 4 MG/2ML IJ SOLN
INTRAMUSCULAR | Status: DC | PRN
  Administered 2018-02-04: 4 mg via INTRAVENOUS

## 2018-02-04 MED ORDER — MEPERIDINE HCL 100 MG/ML IJ SOLN
INTRAMUSCULAR | Status: DC | PRN
  Administered 2018-02-04: 25 mg via INTRAVENOUS
  Administered 2018-02-04: 15 mg via INTRAVENOUS

## 2018-02-04 MED ORDER — LIDOCAINE VISCOUS HCL 2 % MT SOLN
OROMUCOSAL | Status: AC
  Filled 2018-02-04: qty 15

## 2018-02-04 MED ORDER — ONDANSETRON HCL 4 MG/2ML IJ SOLN
INTRAMUSCULAR | Status: AC
  Filled 2018-02-04: qty 2

## 2018-02-04 MED ORDER — SODIUM CHLORIDE 0.9 % IV SOLN
INTRAVENOUS | Status: DC
  Administered 2018-02-04: 08:00:00 via INTRAVENOUS

## 2018-02-04 NOTE — Interval H&P Note (Signed)
History and Physical Interval Note:  02/04/2018 8:35 AM  Rhonda Harris  has presented today for surgery, with the diagnosis of epigastric pain, celiac disease, large hiatal hernia, reflux esophagitis  The various methods of treatment have been discussed with the patient and family. After consideration of risks, benefits and other options for treatment, the patient has consented to  Procedure(s) with comments: ESOPHAGOGASTRODUODENOSCOPY (EGD) (N/A) - 8:30am MALONEY DILATION (N/A) as a surgical intervention .  The patient's history has been reviewed, patient examined, no change in status, stable for surgery.  I have reviewed the patient's chart and labs.  Questions were answered to the patient's satisfaction.     Robert Rourk  No change other than Dexilanat has been associated resolution of shoulder pain.  EGD with ED as feasible/appropriate per plan. The risks, benefits, limitations, alternatives and imponderables have been reviewed with the patient. Potential for esophageal dilation, biopsy, etc. have also been reviewed.  Questions have been answered. All parties agreeable.

## 2018-02-04 NOTE — Discharge Instructions (Signed)
EGD Discharge instructions Please read the instructions outlined below and refer to this sheet in the next few weeks. These discharge instructions provide you with general information on caring for yourself after you leave the hospital. Your doctor may also give you specific instructions. While your treatment has been planned according to the most current medical practices available, unavoidable complications occasionally occur. If you have any problems or questions after discharge, please call your doctor. ACTIVITY  You may resume your regular activity but move at a slower pace for the next 24 hours.   Take frequent rest periods for the next 24 hours.   Walking will help expel (get rid of) the air and reduce the bloated feeling in your abdomen.   No driving for 24 hours (because of the anesthesia (medicine) used during the test).   You may shower.   Do not sign any important legal documents or operate any machinery for 24 hours (because of the anesthesia used during the test).  NUTRITION  Drink plenty of fluids.   You may resume your normal diet.   Begin with a light meal and progress to your normal diet.   Avoid alcoholic beverages for 24 hours or as instructed by your caregiver.  MEDICATIONS  You may resume your normal medications unless your caregiver tells you otherwise.  WHAT YOU CAN EXPECT TODAY  You may experience abdominal discomfort such as a feeling of fullness or gas pains.  FOLLOW-UP  Your doctor will discuss the results of your test with you.  SEEK IMMEDIATE MEDICAL ATTENTION IF ANY OF THE FOLLOWING OCCUR:  Excessive nausea (feeling sick to your stomach) and/or vomiting.   Severe abdominal pain and distention (swelling).   Trouble swallowing.   Temperature over 101 F (37.8 C).   Rectal bleeding or vomiting of blood.    GERD and hiatal hernia information provided  Continue Dexilant 60 mg daily.  Office visit with [place] in 3 months.  Further  recommendations to follow pending review of pathology report      Gastroesophageal Reflux Disease, Adult Normally, food travels down the esophagus and stays in the stomach to be digested. If a person has gastroesophageal reflux disease (GERD), food and stomach acid move back up into the esophagus. When this happens, the esophagus becomes sore and swollen (inflamed). Over time, GERD can make small holes (ulcers) in the lining of the esophagus. Follow these instructions at home: Diet  Follow a diet as told by your doctor. You may need to avoid foods and drinks such as: ? Coffee and tea (with or without caffeine). ? Drinks that contain alcohol. ? Energy drinks and sports drinks. ? Carbonated drinks or sodas. ? Chocolate and cocoa. ? Peppermint and mint flavorings. ? Garlic and onions. ? Horseradish. ? Spicy and acidic foods, such as peppers, chili powder, curry powder, vinegar, hot sauces, and BBQ sauce. ? Citrus fruit juices and citrus fruits, such as oranges, lemons, and limes. ? Tomato-based foods, such as red sauce, chili, salsa, and pizza with red sauce. ? Fried and fatty foods, such as donuts, french fries, potato chips, and high-fat dressings. ? High-fat meats, such as hot dogs, rib eye steak, sausage, ham, and bacon. ? High-fat dairy items, such as whole milk, butter, and cream cheese.  Eat small meals often. Avoid eating large meals.  Avoid drinking large amounts of liquid with your meals.  Avoid eating meals during the 2-3 hours before bedtime.  Avoid lying down right after you eat.  Do not exercise  right after you eat. General instructions  Pay attention to any changes in your symptoms.  Take over-the-counter and prescription medicines only as told by your doctor. Do not take aspirin, ibuprofen, or other NSAIDs unless your doctor says it is okay.  Do not use any tobacco products, including cigarettes, chewing tobacco, and e-cigarettes. If you need help quitting, ask  your doctor.  Wear loose clothes. Do not wear anything tight around your waist.  Raise (elevate) the head of your bed about 6 inches (15 cm).  Try to lower your stress. If you need help doing this, ask your doctor.  If you are overweight, lose an amount of weight that is healthy for you. Ask your doctor about a safe weight loss goal.  Keep all follow-up visits as told by your doctor. This is important. Contact a doctor if:  You have new symptoms.  You lose weight and you do not know why it is happening.  You have trouble swallowing, or it hurts to swallow.  You have wheezing or a cough that keeps happening.  Your symptoms do not get better with treatment.  You have a hoarse voice. Get help right away if:  You have pain in your arms, neck, jaw, teeth, or back.  You feel sweaty, dizzy, or light-headed.  You have chest pain or shortness of breath.  You throw up (vomit) and your throw up looks like blood or coffee grounds.  You pass out (faint).  Your poop (stool) is bloody or black.  You cannot swallow, drink, or eat. This information is not intended to replace advice given to you by your health care provider. Make sure you discuss any questions you have with your health care provider. Document Released: 10/16/2007 Document Revised: 10/05/2015 Document Reviewed: 08/24/2014 Elsevier Interactive Patient Education  2018 [employer]     Hiatal Hernia A hiatal hernia occurs when part of the stomach slides above the muscle that separates the abdomen from the chest (diaphragm). A person can be born with a hiatal hernia (congenital), or it may develop over time. In almost all cases of hiatal hernia, only the top part of the stomach pushes through the diaphragm. Many people have a hiatal hernia with no symptoms. The larger the hernia, the more likely it is that you will have symptoms. In some cases, a hiatal hernia allows stomach acid to flow back into the tube that carries  food from your mouth to your stomach (esophagus). This may cause heartburn symptoms. Severe heartburn symptoms may mean that you have developed a condition called gastroesophageal reflux disease (GERD). What are the causes? This condition is caused by a weakness in the opening (hiatus) where the esophagus passes through the diaphragm to attach to the upper part of the stomach. A person may be born with a weakness in the hiatus, or a weakness can develop over time. What increases the risk? This condition is more likely to develop in:  Older people. Age is a major risk factor for a hiatal hernia, especially if you are over the age of 55.  Pregnant women.  People who are overweight.  People who have frequent constipation.  What are the signs or symptoms? Symptoms of this condition usually develop in the form of GERD symptoms. Symptoms include:  Heartburn.  Belching.  Indigestion.  Trouble swallowing.  Coughing or wheezing.  Sore throat.  Hoarseness.  Chest pain.  Nausea and vomiting.  How is this diagnosed? This condition may be diagnosed during testing for GERD.  Tests that may be done include:  X-rays of your stomach or chest.  An upper gastrointestinal (GI) series. This is an X-ray exam of your GI tract that is taken after you swallow a chalky liquid that shows up clearly on the X-ray.  Endoscopy. This is a procedure to look into your stomach using a thin, flexible tube that has a tiny camera and light on the end of it.  How is this treated? This condition may be treated by:  Dietary and lifestyle changes to help reduce GERD symptoms.  Medicines. These may include: ? Over-the-counter antacids. ? Medicines that make your stomach empty more quickly. ? Medicines that block the production of stomach acid (H2 blockers). ? Stronger medicines to reduce stomach acid (proton pump inhibitors).  Surgery to repair the hernia, if other treatments are not helping.  If you  have no symptoms, you may not need treatment. Follow these instructions at home: Lifestyle and activity  Do not use any products that contain nicotine or tobacco, such as cigarettes and e-cigarettes. If you need help quitting, ask your health care provider.  Try to achieve and maintain a healthy body weight.  Avoid putting pressure on your abdomen. Anything that puts pressure on your abdomen increases the amount of acid that may be pushed up into your esophagus. ? Avoid bending over, especially after eating. ? Raise the head of your bed by putting blocks under the legs. This keeps your head and esophagus higher than your stomach. ? Do not wear tight clothing around your chest or stomach. ? Try not to strain when having a bowel movement, when urinating, or when lifting heavy objects. Eating and drinking  Avoid foods that can worsen GERD symptoms. These may include: ? Fatty foods, like fried foods. ? Citrus fruits, like oranges or lemon. ? Other foods and drinks that contain acid, like orange juice or tomatoes. ? Spicy food. ? Chocolate.  Eat frequent small meals instead of three large meals a day. This helps prevent your stomach from getting too full. ? Eat slowly. ? Do not lie down right after eating. ? Do not eat 1-2 hours before bed.  Do not drink beverages with caffeine. These include cola, coffee, cocoa, and tea.  Do not drink alcohol. General instructions  Take over-the-counter and prescription medicines only as told by your health care provider.  Keep all follow-up visits as told by your health care provider. This is important. Contact a health care provider if:  Your symptoms are not controlled with medicines or lifestyle changes.  You are having trouble swallowing.  You have coughing or wheezing that will not go away. Get help right away if:  Your pain is getting worse.  Your pain spreads to your arms, neck, jaw, teeth, or back.  You have shortness of  breath.  You sweat for no reason.  You feel sick to your stomach (nauseous) or you vomit.  You vomit blood.  You have bright red blood in your stools.  You have black, tarry stools. This information is not intended to replace advice given to you by your health care provider. Make sure you discuss any questions you have with your health care provider. Document Released: 07/20/2003 Document Revised: 04/22/2016 Document Reviewed: 04/22/2016 Elsevier Interactive Patient Education  [employer].

## 2018-02-04 NOTE — Op Note (Signed)
[facility] Patient Name: Rhonda Harris Procedure Date: 02/04/2018 8:28 AM MRN: [id number] Date of Birth: [birth year] Attending MD: Gennette Pac , MD CSN: [id number] Age: 57 Admit Type: Outpatient Procedure:                Upper GI endoscopy Indications:              Dysphagia Providers:                Gennette Pac, MD, Edrick Kins, RN, Edythe Clarity, Technician Referring MD:              Medicines:                Meperidine 40 mg IV, Midazolam 4 mg IV Complications:            No immediate complications. Estimated Blood Loss:     Estimated blood loss was minimal. Procedure:                After obtaining informed consent, the endoscope was                            passed under direct vision. Throughout the                            procedure, the patient's blood pressure, pulse, and                            oxygen saturations were monitored continuously. The                            [id number]-H190 ([id number]) scope was introduced through the                            mouth, and advanced to the second part of duodenum. Scope In: 8:58:47 AM Scope Out: 9:09:07 AM Total Procedure Duration: 0 hours 10 minutes 20 seconds  Findings:      A non-obstructing Schatzki ring was found at the gastroesophageal       junction. The scope was withdrawn. Dilation was performed with a Maloney       dilator with mild resistance at 54 Fr. The dilation site was examined       following endoscope reinsertion and showed no change. Estimated blood       loss: none.      A large hiatal hernia was present. Photos taken but not available       because of technical failure.      Scattered mild mucosal changes were found in the second portion of the       duodenum. Slightly "frondy" appearing duodenal tips. No scalloping seen.       This was biopsied with a cold forceps for histology. Estimated blood       loss was minimal. Impression:               Of note, patient  reports that Dexilant has been  associated with marked improvement in reflux and                            resolution of shoulder pain. .                           - Non-obstructing Schatzki ring. Dilated.                           - Large hiatal hernia.                           - Mucosal changes in the duodenum. Biopsied. Moderate Sedation:      Moderate (conscious) sedation was administered by the endoscopy nurse       and supervised by the endoscopist. The following parameters were       monitored: oxygen saturation, heart rate, blood pressure, respiratory       rate, EKG, adequacy of pulmonary ventilation, and response to care.       Total physician intraservice time was 29 minutes. Recommendation:           - Patient has a contact number available for                            emergencies. The signs and symptoms of potential                            delayed complications were discussed with the                            patient. Return to normal activities tomorrow.                            Written discharge instructions were provided to the                            patient. Procedure Code(s):        --- Professional ---                           [phone], Esophagogastroduodenoscopy, flexible,                            transoral; with biopsy, single or multiple                           43450, Dilation of esophagus, by unguided sound or                            bougie, single or multiple passes                           G0500, Moderate sedation services provided by the                            same physician or other qualified health care  professional performing a gastrointestinal                            endoscopic service that sedation supports,                            requiring the presence of an independent trained                            observer to assist in the monitoring of the                             patient's level of consciousness and physiological                            status; initial 15 minutes of intra-service time;                            patient age 38 years or older (additional time may                            be reported with 16109, as appropriate)                           [phone], Moderate sedation services provided by the                            same physician or other qualified health care                            professional performing the diagnostic or                            therapeutic service that the sedation supports,                            requiring the presence of an independent trained                            observer to assist in the monitoring of the                            patient's level of consciousness and physiological                            status; each additional 15 minutes intraservice                            time (List separately in addition to code for                            primary service) Diagnosis Code(s):        --- Professional ---  K22.2, Esophageal obstruction                           K44.9, Diaphragmatic hernia without obstruction or                            gangrene                           K31.89, Other diseases of stomach and duodenum                           R13.10, Dysphagia, unspecified CPT copyright 2017 American Medical Association. All rights reserved. The codes documented in this report are preliminary and upon coder review may  be revised to meet current compliance requirements. Gerrit Friends. Evertte Sones, MD Gennette Pac, MD 02/04/2018 10:46:31 AM This report has been signed electronically. Number of Addenda: 0

## 2018-02-05 ENCOUNTER — Encounter: Payer: Self-pay | Admitting: Internal Medicine

## 2018-02-09 ENCOUNTER — Encounter (HOSPITAL_COMMUNITY): Payer: Self-pay | Admitting: Internal Medicine

## 2018-02-09 ENCOUNTER — Telehealth: Payer: Self-pay | Admitting: Internal Medicine

## 2018-02-09 DIAGNOSIS — R1310 Dysphagia, unspecified: Secondary | ICD-10-CM

## 2018-02-09 DIAGNOSIS — K2100 Gastro-esophageal reflux disease with esophagitis, without bleeding: Secondary | ICD-10-CM

## 2018-02-09 DIAGNOSIS — R1319 Other dysphagia: Secondary | ICD-10-CM

## 2018-02-09 MED ORDER — DEXLANSOPRAZOLE 60 MG PO CPDR: 60.0000 mg | DELAYED_RELEASE_CAPSULE | Freq: Every day | ORAL | 3 refills | Status: DC

## 2018-02-09 NOTE — Telephone Encounter (Signed)
Pt notified that RX was sent to her pharmacy.  

## 2018-02-09 NOTE — Addendum Note (Signed)
Addended by: Gordy Levan, ERIC A on: 02/09/2018 01:05 PM   Modules accepted: Orders

## 2018-02-09 NOTE — Telephone Encounter (Signed)
Rx sent to pharmacy per patient request. Please notify the patient.

## 2018-02-09 NOTE — Telephone Encounter (Signed)
Pt took her samples of Dexilant and they are working well for her. She needs a prescription called into [facility].

## 2018-02-09 NOTE — Telephone Encounter (Signed)
Pt would like Dexilant sent into pharmacy.

## 2018-02-12 ENCOUNTER — Telehealth: Payer: Self-pay | Admitting: Internal Medicine

## 2018-02-12 DIAGNOSIS — R197 Diarrhea, unspecified: Secondary | ICD-10-CM

## 2018-02-12 DIAGNOSIS — A09 Infectious gastroenteritis and colitis, unspecified: Secondary | ICD-10-CM

## 2018-02-12 DIAGNOSIS — R1084 Generalized abdominal pain: Secondary | ICD-10-CM

## 2018-02-12 DIAGNOSIS — R112 Nausea with vomiting, unspecified: Secondary | ICD-10-CM

## 2018-02-12 NOTE — Telephone Encounter (Signed)
PA was approved. Pt and pharmacy are aware. PA approve will be scanned in chart.

## 2018-02-12 NOTE — Telephone Encounter (Signed)
Pt was asking if she could get more dexilant samples until she can get her prescription filled. [phone]

## 2018-02-12 NOTE — Telephone Encounter (Signed)
Samples will be given when samples arrive. PA was submitted on [email or web address]. Waiting on approval or denial. Pt wasn't pleasant over the phone. Pt was upset with the medication being expensive, the samples given in the office weren't enough per pt. Pt is aware that she will be notified the out come of PA and when samples arrive.

## 2018-02-13 MED ORDER — PANTOPRAZOLE SODIUM 40 MG PO TBEC: 40.0000 mg | DELAYED_RELEASE_TABLET | Freq: Two times a day (BID) | ORAL | 5 refills | Status: DC

## 2018-02-13 NOTE — Telephone Encounter (Signed)
Pt called office, Dexilant is going to cost $140 and she can't afford it. She wants to know if she can restart Pantoprazole. States she was taking it daily and wants to know if she can increase it to twice a day. She uses [facility].

## 2018-02-13 NOTE — Telephone Encounter (Signed)
Did the patient use the rebate card? If not, it would be worth a try if she wants to.   If not, she can go back to pantoprazole 40mg  once daily before breakfast and once before evening meal.   LET ME KNOW WHAT SHE WANTS TO DO BEFORE I SEND IN NEW RX.

## 2018-02-13 NOTE — Addendum Note (Signed)
Addended by: Tiffany Kocher on: 02/13/2018 11:52 AM   Modules accepted: Orders

## 2018-02-13 NOTE — Telephone Encounter (Signed)
Called pt, she tried the rebate card this morning and Dexilant was then going to cost $80-90/month. The cost is still too much. She prefers to restart Pantoprazole. She is aware [facility] will send in rx for her.

## 2018-02-13 NOTE — Telephone Encounter (Signed)
rx done

## 2018-02-16 NOTE — Addendum Note (Signed)
Addended by: Tiffany Kocher on: 02/16/2018 10:00 AM   Modules accepted: Orders

## 2018-02-16 NOTE — Telephone Encounter (Signed)
Offered lab work today for pt, she denied. Offered OV for this week, pt denied, offered nausea medication and pt stated she is already taking oct medication. Pt said if she wants to call in medication, it's fine. Pt said she works all day and isn't able to get off to have labs or come in for an appointment. Pt is aware that I can fax lab orders over to [facility] to have done if it's after 5 pm. Pt couldn't say if she would have labs done. Pt asked to think about if she wants to do labs and call back if wanted.

## 2018-02-16 NOTE — Telephone Encounter (Signed)
Called pt this morning to let her know about her RX being called in. Pt said she has also been having some diarrhea and vomiting since last Monday or Tuesday daily. She wakes up and feels like it just hits her and she will have diarrhea throughout the day. Anything that she eats, comes back up. She doesn't have an appetite. She does take otc diarrhea meds about three times daily to try to control the diarrhea. Stomach cramping does start before diarrhea comes. Pts EGD was 02/04/18. Her stools aren't watery but very loose.

## 2018-02-16 NOTE — Telephone Encounter (Signed)
Let's have her do blood work today. Checking for gluten exposure and for dehydration.   Offer her OV this week, I have opening on Wednesday.   Lab orders entered.   I can send in nausea med if she likes.

## 2018-02-17 ENCOUNTER — Telehealth: Payer: Self-pay | Admitting: Internal Medicine

## 2018-02-17 MED ORDER — ONDANSETRON HCL 4 MG PO TABS: 4.0000 mg | ORAL_TABLET | Freq: Three times a day (TID) | ORAL | 0 refills | Status: DC | PRN

## 2018-02-17 NOTE — Telephone Encounter (Signed)
Noted. Pt is aware RX would be sent in on 02/16/18.

## 2018-02-17 NOTE — Addendum Note (Signed)
Addended by: Tiffany Kocher on: 02/17/2018 07:57 AM   Modules accepted: Orders

## 2018-02-17 NOTE — Telephone Encounter (Signed)
Sent in rx for nausea med.

## 2018-02-17 NOTE — Telephone Encounter (Signed)
Pt is aware that medicine was sent to pharmacy this morning by [facility]. Lab orders were faxed.

## 2018-02-17 NOTE — Telephone Encounter (Signed)
Pt called to say we needed to send her lab orders to [facility] and she needed a prescription for nausea sent to [facility] pharmacy.

## 2018-02-19 ENCOUNTER — Telehealth: Payer: Self-pay | Admitting: *Deleted

## 2018-02-19 ENCOUNTER — Other Ambulatory Visit: Payer: Self-pay

## 2018-02-19 ENCOUNTER — Other Ambulatory Visit: Payer: Self-pay | Admitting: *Deleted

## 2018-02-19 DIAGNOSIS — D649 Anemia, unspecified: Secondary | ICD-10-CM

## 2018-02-19 LAB — COMPREHENSIVE METABOLIC PANEL
AG Ratio: 1.7 (calc) (ref 1.0–2.5)
ALBUMIN MSPROF: 3.7 g/dL (ref 3.6–5.1)
ALKALINE PHOSPHATASE (APISO): 62 U/L (ref 33–130)
ALT: 15 U/L (ref 6–29)
AST: 20 U/L (ref 10–35)
BUN: 13 mg/dL (ref 7–25)
CO2: 27 mmol/L (ref 20–32)
CREATININE: 0.86 mg/dL (ref 0.50–1.05)
Calcium: 9.3 mg/dL (ref 8.6–10.4)
Chloride: 106 mmol/L (ref 98–110)
GLOBULIN: 2.2 g/dL (ref 1.9–3.7)
GLUCOSE: 96 mg/dL (ref 65–99)
Potassium: 4.5 mmol/L (ref 3.5–5.3)
Sodium: 139 mmol/L (ref 135–146)
TOTAL PROTEIN: 5.9 g/dL — AB (ref 6.1–8.1)
Total Bilirubin: 0.3 mg/dL (ref 0.2–1.2)

## 2018-02-19 LAB — LIPASE: LIPASE: 20 U/L (ref 7–60)

## 2018-02-19 LAB — CBC WITH DIFFERENTIAL/PLATELET
BASOS PCT: 1 %
Basophils Absolute: 39 cells/uL (ref 0–200)
EOS PCT: 3.8 %
Eosinophils Absolute: 148 cells/uL (ref 15–500)
HCT: 25.2 % — ABNORMAL LOW (ref 35.0–45.0)
HEMOGLOBIN: 7.1 g/dL — AB (ref 11.7–15.5)
Lymphs Abs: 1088 cells/uL (ref 850–3900)
MCH: 18.6 pg — ABNORMAL LOW (ref 27.0–33.0)
MCHC: 28.2 g/dL — ABNORMAL LOW (ref 32.0–36.0)
MCV: 66.1 fL — ABNORMAL LOW (ref 80.0–100.0)
MONOS PCT: 12.5 %
MPV: 11.2 fL (ref 7.5–12.5)
Neutro Abs: 2137 cells/uL (ref 1500–7800)
Neutrophils Relative %: 54.8 %
PLATELETS: 352 10*3/uL (ref 140–400)
RBC: 3.81 10*6/uL (ref 3.80–5.10)
RDW: 16.4 % — ABNORMAL HIGH (ref 11.0–15.0)
TOTAL LYMPHOCYTE: 27.9 %
WBC mixed population: 488 cells/uL (ref 200–950)
WBC: 3.9 10*3/uL (ref 3.8–10.8)

## 2018-02-19 LAB — TISSUE TRANSGLUTAMINASE, IGA: (tTG) Ab, IgA: 1 U/mL

## 2018-02-19 MED ORDER — PEG 3350-KCL-NA BICARB-NACL 420 G PO SOLR: 4000.0000 mL | Freq: Once | ORAL | 0 refills | Status: AC

## 2018-02-19 NOTE — Telephone Encounter (Signed)
Spoke with Eber Jones in Endo and she stated it was okay to do ASAP TCS w/ RMR on 02/25/18 at 3:00pm. Spoke with patient and she reports she has not taken her iron tablet today. Per [facility] okay to hold iron x 6 days instead of x7days.  TCS schedueld for 02/25/18 at 3:00pm. Patient aware prep sent into the pharmacy and I have faxed her instructions to the pharmacy for her to pick up with her prep. Confirmation received that fax for instructions went through. She voiced understanding. She is also aware to start holding iron now.

## 2018-02-19 NOTE — Telephone Encounter (Signed)
Confirmed with pharmacy instructions were received

## 2018-02-19 NOTE — Telephone Encounter (Signed)
Called patient insurance and was advised no PA is required for TCS. Ref# [id number]

## 2018-02-25 ENCOUNTER — Ambulatory Visit (HOSPITAL_COMMUNITY)
Admission: RE | Admit: 2018-02-25 | Discharge: 2018-02-25 | Disposition: A | Discharge status: Home / Self Care | Payer: No Typology Code available for payment source | Source: Ambulatory Visit | Attending: Internal Medicine | Admitting: Internal Medicine

## 2018-02-25 ENCOUNTER — Encounter (HOSPITAL_COMMUNITY)
Admission: RE | Disposition: A | Discharge status: Home / Self Care | Payer: Self-pay | Source: Ambulatory Visit | Attending: Internal Medicine

## 2018-02-25 ENCOUNTER — Other Ambulatory Visit: Payer: Self-pay

## 2018-02-25 ENCOUNTER — Encounter (HOSPITAL_COMMUNITY): Payer: Self-pay

## 2018-02-25 DIAGNOSIS — K219 Gastro-esophageal reflux disease without esophagitis: Secondary | ICD-10-CM | POA: Diagnosis not present

## 2018-02-25 DIAGNOSIS — Z87891 Personal history of nicotine dependence: Secondary | ICD-10-CM | POA: Insufficient documentation

## 2018-02-25 DIAGNOSIS — K900 Celiac disease: Secondary | ICD-10-CM | POA: Insufficient documentation

## 2018-02-25 DIAGNOSIS — D509 Iron deficiency anemia, unspecified: Secondary | ICD-10-CM | POA: Diagnosis present

## 2018-02-25 DIAGNOSIS — Z79899 Other long term (current) drug therapy: Secondary | ICD-10-CM | POA: Insufficient documentation

## 2018-02-25 DIAGNOSIS — K5730 Diverticulosis of large intestine without perforation or abscess without bleeding: Secondary | ICD-10-CM | POA: Insufficient documentation

## 2018-02-25 DIAGNOSIS — D649 Anemia, unspecified: Secondary | ICD-10-CM

## 2018-02-25 HISTORY — PX: COLONOSCOPY: SHX5424

## 2018-02-25 HISTORY — PX: BIOPSY: SHX5522

## 2018-02-25 SURGERY — COLONOSCOPY
Anesthesia: Moderate Sedation

## 2018-02-25 MED ORDER — MIDAZOLAM HCL 5 MG/5ML IJ SOLN
INTRAMUSCULAR | Status: AC
  Filled 2018-02-25: qty 5

## 2018-02-25 MED ORDER — MIDAZOLAM HCL 5 MG/5ML IJ SOLN
INTRAMUSCULAR | Status: DC | PRN
  Administered 2018-02-25 (×6): 1 mg via INTRAVENOUS

## 2018-02-25 MED ORDER — MEPERIDINE HCL 50 MG/ML IJ SOLN
INTRAMUSCULAR | Status: AC
  Filled 2018-02-25: qty 1

## 2018-02-25 MED ORDER — ONDANSETRON HCL 4 MG/2ML IJ SOLN
INTRAMUSCULAR | Status: DC | PRN
  Administered 2018-02-25: 4 mg via INTRAVENOUS

## 2018-02-25 MED ORDER — MIDAZOLAM HCL 5 MG/5ML IJ SOLN
INTRAMUSCULAR | Status: AC
  Filled 2018-02-25: qty 10

## 2018-02-25 MED ORDER — ONDANSETRON HCL 4 MG/2ML IJ SOLN
INTRAMUSCULAR | Status: AC
  Filled 2018-02-25: qty 2

## 2018-02-25 MED ORDER — MEPERIDINE HCL 100 MG/ML IJ SOLN
INTRAMUSCULAR | Status: DC | PRN
  Administered 2018-02-25 (×2): 25 mg via INTRAVENOUS

## 2018-02-25 MED ORDER — STERILE WATER FOR IRRIGATION IR SOLN
Status: DC | PRN
  Administered 2018-02-25: 14:00:00

## 2018-02-25 MED ORDER — SODIUM CHLORIDE 0.9 % IV SOLN
INTRAVENOUS | Status: DC
  Administered 2018-02-25: 14:00:00 via INTRAVENOUS

## 2018-02-25 NOTE — Op Note (Signed)
[facility] Patient Name: Rhonda Harris Procedure Date: 02/25/2018 2:07 PM MRN: [id number] Date of Birth: 1960-12-25 Attending MD: Norvel Richards , MD CSN: [id number] Age: 57 Admit Type: Outpatient Procedure:                Colonoscopy Indications:              Iron deficiency anemia Providers:                Norvel Richards, MD, Lurline Del, RN, Nelma Rothman, Technician Referring MD:              Medicines:                Midazolam 6 mg IV, Meperidine 50 mg IV Complications:            No immediate complications. Estimated Blood Loss:     Estimated blood loss was minimal. Procedure:                Pre-Anesthesia Assessment:                           - Prior to the procedure, a History and Physical                            was performed, and patient medications and                            allergies were reviewed. The patient's tolerance of                            previous anesthesia was also reviewed. The risks                            and benefits of the procedure and the sedation                            options and risks were discussed with the patient.                            All questions were answered, and informed consent                            was obtained. Prior Anticoagulants: The patient has                            taken no previous anticoagulant or antiplatelet                            agents. ASA Grade Assessment: II - A patient with                            mild systemic disease. After reviewing the risks  and benefits, the patient was deemed in                            satisfactory condition to undergo the procedure.                           After obtaining informed consent, the colonoscope                            was passed under direct vision. Throughout the                            procedure, the patient's blood pressure, pulse, and                            oxygen  saturations were monitored continuously. The                            CF-[id number] ([id number]) scope was introduced through                            the anus and advanced to the 5 cm into the ileum.                            The colonoscopy was performed without difficulty.                            The patient tolerated the procedure well. The                            quality of the bowel preparation was adequate. Scope In: 2:26:29 PM Scope Out: 2:43:33 PM Scope Withdrawal Time: 0 hours 10 minutes 53 seconds  Total Procedure Duration: 0 hours 17 minutes 4 seconds  Findings:      The perianal and digital rectal examinations were normal.      Scattered medium-mouthed diverticula were found in the sigmoid colon and       descending colon.      The exam was otherwise without abnormality on direct and retroflexion       views. 5 cm of terminal ileal mucosa appears eroded and slightly       "cobblestone". Biopsies taken. Impression:               - Diverticulosis in the sigmoid colon and in the                            descending colon.                           - The examination was otherwise normal on direct                            and retroflexion views. Abnormal appearing terminal                            ileum  of uncertain significance status post biopsy.                           - No specimens collected. Moderate Sedation:      Moderate (conscious) sedation was administered by the endoscopy nurse       and supervised by the endoscopist. The following parameters were       monitored: oxygen saturation, heart rate, blood pressure, respiratory       rate, EKG, adequacy of pulmonary ventilation, and response to care.       Total physician intraservice time was 21 minutes. Recommendation:           - Patient has a contact number available for                            emergencies. The signs and symptoms of potential                            delayed complications were  discussed with the                            patient. Return to normal activities tomorrow.                            Written discharge instructions were provided to the                            patient.                           - Resume previous diet.                           - Continue present medications. Follow-up on                            pathology. As discussed with patient and her                            husband, she may need a capsule study of her small                            intestine in the near future.                           - Repeat colonoscopy in 10 years for screening                            purposes.                           - Return to GI office (date not yet determined). Procedure Code(s):        --- Professional ---                           [phone], Colonoscopy, flexible; diagnostic, including  collection of specimen(s) by brushing or washing,                            when performed (separate procedure)                           G0500, Moderate sedation services provided by the                            same physician or other qualified health care                            professional performing a gastrointestinal                            endoscopic service that sedation supports,                            requiring the presence of an independent trained                            observer to assist in the monitoring of the                            patient's level of consciousness and physiological                            status; initial 15 minutes of intra-service time;                            patient age 65 years or older (additional time may                            be reported with [phone], as appropriate) Diagnosis Code(s):        --- Professional ---                           D50.9, Iron deficiency anemia, unspecified                           K57.30, Diverticulosis of large intestine without                             perforation or abscess without bleeding CPT copyright 2018 American Medical Association. All rights reserved. The codes documented in this report are preliminary and upon coder review may  be revised to meet current compliance requirements. Cristopher Estimable. Rourk, MD Norvel Richards, MD 02/25/2018 2:52:32 PM This report has been signed electronically. Number of Addenda: 0

## 2018-02-25 NOTE — Discharge Instructions (Signed)
Colonoscopy Discharge Instructions  Read the instructions outlined below and refer to this sheet in the next few weeks. These discharge instructions provide you with general information on caring for yourself after you leave the hospital. Your doctor may also give you specific instructions. While your treatment has been planned according to the most current medical practices available, unavoidable complications occasionally occur. If you have any problems or questions after discharge, call Dr. Jena Gauss at [phone]. ACTIVITY  You may resume your regular activity, but move at a slower pace for the next 24 hours.   Take frequent rest periods for the next 24 hours.   Walking will help get rid of the air and reduce the bloated feeling in your belly (abdomen).   No driving for 24 hours (because of the medicine (anesthesia) used during the test).    Do not sign any important legal documents or operate any machinery for 24 hours (because of the anesthesia used during the test).  NUTRITION  Drink plenty of fluids.   You may resume your normal diet as instructed by your doctor.   Begin with a light meal and progress to your normal diet. Heavy or fried foods are harder to digest and may make you feel sick to your stomach (nauseated).   Avoid alcoholic beverages for 24 hours or as instructed.  MEDICATIONS  You may resume your normal medications unless your doctor tells you otherwise.  WHAT YOU CAN EXPECT TODAY  Some feelings of bloating in the abdomen.   Passage of more gas than usual.   Spotting of blood in your stool or on the toilet paper.  IF YOU HAD POLYPS REMOVED DURING THE COLONOSCOPY:  No aspirin products for 7 days or as instructed.   No alcohol for 7 days or as instructed.   Eat a soft diet for the next 24 hours.  FINDING OUT THE RESULTS OF YOUR TEST Not all test results are available during your visit. If your test results are not back during the visit, make an appointment  with your caregiver to find out the results. Do not assume everything is normal if you have not heard from your caregiver or the medical facility. It is important for you to follow up on all of your test results.  SEEK IMMEDIATE MEDICAL ATTENTION IF:  You have more than a spotting of blood in your stool.   Your belly is swollen (abdominal distention).   You are nauseated or vomiting.   You have a temperature over 101.   You have abdominal pain or discomfort that is severe or gets worse throughout the day.     Diverticulosis information provided  Further recommendations to follow pending review of pathology report  Further evaluation of your small intestine with a capsule study may be needed   Diverticulosis Diverticulosis is a condition that develops when small pouches (diverticula) form in the wall of the large intestine (colon). The colon is where water is absorbed and stool is formed. The pouches form when the inside layer of the colon pushes through weak spots in the outer layers of the colon. You may have a few pouches or many of them. What are the causes? The cause of this condition is not known. What increases the risk? The following factors may make you more likely to develop this condition:  Being older than age 21. Your risk for this condition increases with age. Diverticulosis is rare among people younger than age 34. By age 27, many people have it.  Eating a low-fiber diet.  Having frequent constipation.  Being overweight.  Not getting enough exercise.  Smoking.  Taking over-the-counter pain medicines, like aspirin and ibuprofen.  Having a family history of diverticulosis.  What are the signs or symptoms? In most people, there are no symptoms of this condition. If you do have symptoms, they may include:  Bloating.  Cramps in the abdomen.  Constipation or diarrhea.  Pain in the lower left side of the abdomen.  How is this diagnosed? This condition  is most often diagnosed during an exam for other colon problems. Because diverticulosis usually has no symptoms, it often cannot be diagnosed independently. This condition may be diagnosed by:  Using a flexible scope to examine the colon (colonoscopy).  Taking an X-ray of the colon after dye has been put into the colon (barium enema).  Doing a CT scan.  How is this treated? You may not need treatment for this condition if you have never developed an infection related to diverticulosis. If you have had an infection before, treatment may include:  Eating a high-fiber diet. This may include eating more fruits, vegetables, and grains.  Taking a fiber supplement.  Taking a live bacteria supplement (probiotic).  Taking medicine to relax your colon.  Taking antibiotic medicines.  Follow these instructions at home:  Drink 6-8 glasses of water or more each day to prevent constipation.  Try not to strain when you have a bowel movement.  If you have had an infection before: ? Eat more fiber as directed by your health care provider or your diet and nutrition specialist (dietitian). ? Take a fiber supplement or probiotic, if your health care provider approves.  Take over-the-counter and prescription medicines only as told by your health care provider.  If you were prescribed an antibiotic, take it as told by your health care provider. Do not stop taking the antibiotic even if you start to feel better.  Keep all follow-up visits as told by your health care provider. This is important. Contact a health care provider if:  You have pain in your abdomen.  You have bloating.  You have cramps.  You have not had a bowel movement in 3 days. Get help right away if:  Your pain gets worse.  Your bloating becomes very bad.  You have a fever or chills, and your symptoms suddenly get worse.  You vomit.  You have bowel movements that are bloody or black.  You have bleeding from your  rectum. Summary  Diverticulosis is a condition that develops when small pouches (diverticula) form in the wall of the large intestine (colon).  You may have a few pouches or many of them.  This condition is most often diagnosed during an exam for other colon problems.  If you have had an infection related to diverticulosis, treatment may include increasing the fiber in your diet, taking supplements, or taking medicines. This information is not intended to replace advice given to you by your health care provider. Make sure you discuss any questions you have with your health care provider. Document Released: 01/25/2004 Document Revised: 03/18/2016 Document Reviewed: 03/18/2016 Elsevier Interactive Patient Education  2017 [employer].

## 2018-02-25 NOTE — H&P (Signed)
@LOGO @   Primary Care Physician:  , MD Primary Gastroenterologist:  Dr. Merlyn Albert  Pre-Procedure History & Physical: HPI:  Rhonda Harris is a 57 y.o. female here for further evaluation of iron deficiency anemia via colonoscopy.  Past Medical History:  Diagnosis Date  . Celiac disease March 2015  . Esophageal erosions   . GERD (gastroesophageal reflux disease)     Past Surgical History:  Procedure Laterality Date  . BIOPSY  02/04/2018   Procedure: BIOPSY;  Surgeon: 02/06/2018, MD;  Location: [facility];  Service: Endoscopy;;  duodenal bx's  . COLONOSCOPY WITH ESOPHAGOGASTRODUODENOSCOPY (EGD) N/A 07/12/2013   Dr. 09/11/2013: EGD revealed erosive reflux esophagitis, large hiatal hernia, antral erosions, abnormal duodenum with path revealing partially developed celiac sprue. Celiac serologies normal but felt to be dealing with celiac disease. Colonoscopy with ileal erosions, negative path.   . ESOPHAGOGASTRODUODENOSCOPY N/A 02/04/2018   Procedure: ESOPHAGOGASTRODUODENOSCOPY (EGD);  Surgeon: 02/06/2018, MD;  Location: [facility];  Service: Endoscopy;  Laterality: N/A;  8:30am  . MALONEY DILATION N/A 02/04/2018   Procedure: 02/06/2018 DILATION;  Surgeon: Elease Hashimoto, MD;  Location: [facility];  Service: Endoscopy;  Laterality: N/A;  . TUBAL LIGATION      Prior to Admission medications   Medication Sig Start Date End Date Taking? Authorizing Provider  aspirin-acetaminophen-caffeine (EXCEDRIN MIGRAINE) [phone] MG tablet Take 2 tablets by mouth every 6 (six) hours as needed for headache or migraine.   Yes [provider]  Calcium Carb-Cholecalciferol (CALCIUM + VITAMIN D3 PO) Take 1 tablet by mouth daily.   Yes [provider]  ferrous sulfate 325 (65 FE) MG tablet Take 325 mg by mouth daily with breakfast.   Yes [provider]  meloxicam (MOBIC) 15 MG tablet Take 15 mg by mouth daily as needed for pain.   Yes [provider]   Multiple Vitamin (MULTIVITAMIN) tablet Take 2 tablets by mouth daily.    Yes [provider]  ondansetron (ZOFRAN) 4 MG tablet Take 1 tablet (4 mg total) by mouth every 8 (eight) hours as needed for nausea or vomiting. 02/17/18  Yes 04/19/18, PA-C  pantoprazole (PROTONIX) 40 MG tablet Take 1 tablet (40 mg total) by mouth 2 (two) times daily before a meal. 02/13/18  Yes 04/15/18, PA-C    Allergies as of 02/19/2018  . (No Known Allergies)    Family History  Problem Relation Age of Onset  . Osteoporosis Mother   . Cancer Father 52       pancreatic  . Colon cancer Neg Hx     Social History   Socioeconomic History  . Marital status: Married    Spouse name: Not on file  . Number of children: Not on file  . Years of education: Not on file  . Highest education level: Not on file  Occupational History  . Not on file  Social Needs  . Financial resource strain: Not on file  . Food insecurity:    Worry: Not on file    Inability: Not on file  . Transportation needs:    Medical: Not on file    Non-medical: Not on file  Tobacco Use  . Smoking status: Former Smoker    Last attempt to quit: 07/02/1993    Years since quitting: 24.6  . Smokeless tobacco: Never Used  . Tobacco comment: only smoked about 2 years as teenager  Substance and Sexual Activity  . Alcohol use:  Yes    Comment: rare social   . Drug use: No  . Sexual activity: Yes    Birth control/protection: Post-menopausal, Surgical  Lifestyle  . Physical activity:    Days per week: Not on file    Minutes per session: Not on file  . Stress: Not on file  Relationships  . Social connections:    Talks on phone: Not on file    Gets together: Not on file    Attends religious service: Not on file    Active member of club or organization: Not on file    Attends meetings of clubs or organizations: Not on file    Relationship status: Not on file  . Intimate partner violence:    Fear of current or ex  partner: Not on file    Emotionally abused: Not on file    Physically abused: Not on file    Forced sexual activity: Not on file  Other Topics Concern  . Not on file  Social History Narrative  . Not on file    Review of Systems: See HPI, otherwise negative ROS  Physical Exam: BP 136/74   Pulse 93   Temp 97.7 F (36.5 C) (Oral)   Resp 12   SpO2 97%  General:   Alert,  Well-developed, well-nourished, pleasant and cooperative in NAD Neck:  Supple; no masses or thyromegaly. No significant cervical adenopathy. Lungs:  Clear throughout to auscultation.   No wheezes, crackles, or rhonchi. No acute distress. Heart:  Regular rate and rhythm; no murmurs, clicks, rubs,  or gallops. Abdomen: Non-distended, normal bowel sounds.  Soft and nontender without appreciable mass or hepatosplenomegaly.  Pulses:  Normal pulses noted. Extremities:  Without clubbing or edema.  Impression/Plan: 57 year old lady with notable iron deficiency anemia.  Colonoscopy needs to be updated.  To this end, I have offered the patient a diagnostic colonoscopy today.  The risks, benefits, limitations, alternatives and imponderables have been reviewed with the patient. Questions have been answered. All parties are agreeable.      Notice: This dictation was prepared with Dragon dictation along with smaller phrase technology. Any transcriptional errors that result from this process are unintentional and may not be corrected upon review.

## 2018-03-02 ENCOUNTER — Encounter: Payer: Self-pay | Admitting: Internal Medicine

## 2018-03-03 ENCOUNTER — Encounter (HOSPITAL_COMMUNITY): Payer: Self-pay | Admitting: Internal Medicine

## 2018-03-04 LAB — CBC WITH DIFFERENTIAL/PLATELET
BASOS ABS: 59 {cells}/uL (ref 0–200)
Basophils Relative: 1.1 %
EOS ABS: 108 {cells}/uL (ref 15–500)
EOS PCT: 2 %
HCT: 29.9 % — ABNORMAL LOW (ref 35.0–45.0)
Hemoglobin: 8.5 g/dL — ABNORMAL LOW (ref 11.7–15.5)
Lymphs Abs: 1993 cells/uL (ref 850–3900)
MCH: 18.7 pg — AB (ref 27.0–33.0)
MCHC: 28.4 g/dL — AB (ref 32.0–36.0)
MCV: 65.7 fL — AB (ref 80.0–100.0)
MPV: 11.5 fL (ref 7.5–12.5)
Monocytes Relative: 10.1 %
NEUTROS PCT: 49.9 %
Neutro Abs: 2695 cells/uL (ref 1500–7800)
Platelets: 375 10*3/uL (ref 140–400)
RBC: 4.55 10*6/uL (ref 3.80–5.10)
RDW: 18.2 % — ABNORMAL HIGH (ref 11.0–15.0)
Total Lymphocyte: 36.9 %
WBC mixed population: 545 cells/uL (ref 200–950)
WBC: 5.4 10*3/uL (ref 3.8–10.8)

## 2018-03-04 LAB — CBC MORPHOLOGY

## 2018-03-06 ENCOUNTER — Other Ambulatory Visit: Payer: Self-pay

## 2018-03-06 DIAGNOSIS — D649 Anemia, unspecified: Secondary | ICD-10-CM

## 2018-03-09 ENCOUNTER — Other Ambulatory Visit: Payer: Self-pay | Admitting: *Deleted

## 2018-03-09 ENCOUNTER — Encounter: Payer: Self-pay | Admitting: *Deleted

## 2018-03-09 DIAGNOSIS — D649 Anemia, unspecified: Secondary | ICD-10-CM

## 2018-03-23 ENCOUNTER — Ambulatory Visit (HOSPITAL_COMMUNITY)
Admission: RE | Admit: 2018-03-23 | Discharge: 2018-03-23 | Disposition: A | Discharge status: Home / Self Care | Payer: No Typology Code available for payment source | Source: Ambulatory Visit | Attending: Internal Medicine | Admitting: Internal Medicine

## 2018-03-23 ENCOUNTER — Encounter (HOSPITAL_COMMUNITY)
Admission: RE | Disposition: A | Discharge status: Home / Self Care | Payer: Self-pay | Source: Ambulatory Visit | Attending: Internal Medicine

## 2018-03-23 DIAGNOSIS — D500 Iron deficiency anemia secondary to blood loss (chronic): Secondary | ICD-10-CM | POA: Insufficient documentation

## 2018-03-23 HISTORY — PX: GIVENS CAPSULE STUDY: SHX5432

## 2018-03-23 SURGERY — IMAGING PROCEDURE, GI TRACT, INTRALUMINAL, VIA CAPSULE

## 2018-03-24 ENCOUNTER — Encounter (HOSPITAL_COMMUNITY): Payer: Self-pay | Admitting: Internal Medicine

## 2018-03-24 ENCOUNTER — Telehealth: Payer: Self-pay | Admitting: Gastroenterology

## 2018-03-24 DIAGNOSIS — K6389 Other specified diseases of intestine: Secondary | ICD-10-CM

## 2018-03-24 DIAGNOSIS — D500 Iron deficiency anemia secondary to blood loss (chronic): Secondary | ICD-10-CM

## 2018-03-24 NOTE — Procedures (Addendum)
Small Bowel [place] Capsule Study Procedure date: 03/23/18  Referring Provider:  Dr. Jena Gauss PCP:  Dr. Gerda Diss, Vilinda Blanks, MD  Indication for procedure:   57 year old female with history of likely celiac disease, found to have recurrent IDA with Hgb 7 one month ago, normal celiac serologies, following strict gluten-free diet. EGD and colonoscopy both completed without obvious source. Abnormal TI with pathology consistent with prominent lymphoid patches. No overt GI bleeding. Capsule study now due to recurrent IDA.     Findings:   Capsule study complete to the cecum. No obvious mass, tumor, strictures. Scattered lymphangiectasias in proximal portion of small intestine. Small, non-bleeding, few erosions/ucers around 1:05:52. Prominent fold vs benign-appearing polyp with erosion noted at 2:37:24. Abnormal distal small bowel and ileum with characteristics of lymphoid hyperplasia. Abnormal villi of uncertain significant noted at 3:26:46. Possible few erosions at TI. No obvious bleeding noted.   First Gastric image:  00:00:30 First Duodenal image: 00:26:41 First Cecal image: 4:12:23 Gastric Passage time:0h 52m Small Bowel Passage time:  3h 9m  Summary & Recommendations: 57 year old female felt to have celiac disease initially diagnosed several years ago, now with recurrent profound IDA. Colonoscopy/EGD overall unrevealing. Small bowel capsule without mass, but few erosions/?query ulcerations noted. Abnormal villi towards distal small intestine of uncertain significance. It is unknown if she has been taking NSAIDs. Recommend completed avoidance of any NSAIDs and referral to Hematology for management of profound IDA. She may need imaging if further abdominal pain, specifically CTE. As of note, she has had no overt GI bleeding or changes in bowel habits. Keep follow-up in our office upcoming. Will review pertinent images with attending.  Gelene Mink, PhD, ANP-BC [facility]

## 2018-03-24 NOTE — Telephone Encounter (Signed)
Ab pt was notified of results. Pt does take Excedrin and Mobic as needed for her back pain. Pt doesn't want to pursue the hematology consult for IV iron due to profound anemia.

## 2018-03-24 NOTE — Telephone Encounter (Signed)
Please let patient know I reviewed capsule study. Overall, no alarming features. No obvious mass. She does have some scattered erosions throughout the small bowel. There are a few images I will review with Dr. Jena Gauss.   Most important thing to know is this: does she take any Ibuprofen, Advil, Aleve, Motrin, aspirin products, Goody's etc? I see that Excedrin migraine is on her list, and this has aspirin in it. She is also taking Mobic prn per the med list. Any other agents? This could cause some of the findings in her small bowel.   If willing, she could benefit from hematology consultation for IV iron due to profound anemia. Keep appt with Verlon Au upcoming. I have copied Verlon Au on this, who saw her in the office and will see her in January. The capsule study is saved under findings, with pertinent images saved and arrows pointing to the areas I commented on.

## 2018-03-27 NOTE — Telephone Encounter (Signed)
Pt notified of AB recommendations and agreed with plan.

## 2018-03-27 NOTE — Telephone Encounter (Signed)
Needs to avoid Excedrin indefinitely. Limit Mobic. Try to use Tylenol products. Keep follow-up with Verlon Au.

## 2018-03-31 NOTE — Telephone Encounter (Signed)
Noted. She is due for follow up labs in couple of weeks and ov 05/2018.

## 2018-04-01 ENCOUNTER — Other Ambulatory Visit: Payer: Self-pay

## 2018-04-01 DIAGNOSIS — D649 Anemia, unspecified: Secondary | ICD-10-CM

## 2018-04-01 NOTE — Telephone Encounter (Signed)
Noted. Orders mailed to pt.

## 2018-04-08 LAB — CBC WITH DIFFERENTIAL/PLATELET
BASOS PCT: 1.3 %
Basophils Absolute: 62 cells/uL (ref 0–200)
EOS ABS: 130 {cells}/uL (ref 15–500)
Eosinophils Relative: 2.7 %
HEMATOCRIT: 33.8 % — AB (ref 35.0–45.0)
Hemoglobin: 9.8 g/dL — ABNORMAL LOW (ref 11.7–15.5)
LYMPHS ABS: 1891 {cells}/uL (ref 850–3900)
MCH: 20.2 pg — ABNORMAL LOW (ref 27.0–33.0)
MCHC: 29 g/dL — ABNORMAL LOW (ref 32.0–36.0)
MCV: 69.7 fL — AB (ref 80.0–100.0)
Monocytes Relative: 11.5 %
NEUTROS ABS: 2165 {cells}/uL (ref 1500–7800)
Neutrophils Relative %: 45.1 %
Platelets: 289 10*3/uL (ref 140–400)
RBC: 4.85 10*6/uL (ref 3.80–5.10)
RDW: 23.1 % — AB (ref 11.0–15.0)
Total Lymphocyte: 39.4 %
WBC: 4.8 10*3/uL (ref 3.8–10.8)
WBCMIX: 552 {cells}/uL (ref 200–950)

## 2018-04-08 LAB — CBC MORPHOLOGY

## 2018-04-14 NOTE — Telephone Encounter (Signed)
Pt notified. Please schedule CT per AB.

## 2018-04-14 NOTE — Telephone Encounter (Signed)
Please let patient know pertinent images were reviewed with Dr. Jena Gauss. One of these images was of a fold vs polyp. We need to do a CT enterography to further evaluate. FYI to [place].

## 2018-04-15 NOTE — Telephone Encounter (Signed)
CT Entero is scheduled for 05/04/18 at [place] patient and reports she needs after christmas. She can do 12/26 or 12/27.  Called central scheduling and she is now scheduled for 05/07/18 at 10:00am, arrival time 8:15am. Nothing to eat or drink 4 hrs prior. She will drink prep when she arrives. Called patient and made aware of appt details. She voiced understanding.

## 2018-04-15 NOTE — Telephone Encounter (Signed)
Called [facility] and spoke with Trish. She advised me to fax the order and auth to them at [id number]. I have faxed over this information. I advised Trish she already has a scheduled appt at [facility] for 05/07/18. She states she will call the patient and discuss where she will need to go

## 2018-04-15 NOTE — Telephone Encounter (Signed)
Small bowel mass.

## 2018-04-15 NOTE — Telephone Encounter (Signed)
AB, please advise diagnosis for order? I will put this in Epic and schedule. Thanks

## 2018-04-15 NOTE — Addendum Note (Signed)
Addended by: Inge Rise on: 04/15/2018 09:14 AM   Modules accepted: Orders

## 2018-04-15 NOTE — Telephone Encounter (Signed)
PA done via [employer]'s website. PA was approved Josem Kaufmann [id number] dates 04/15/18-07/14/18. Received a message with approval: "This [employer] member has optional facility assignment and scheduling through [facility]. The only facility record you may enter in the case is [facility]. [facility] will contact the member directly to schedule, if this request is approved. You may contact [facility] at [phone]."  Called patient and made aware of above.

## 2018-04-16 NOTE — Telephone Encounter (Signed)
Spoke with Juliette Alcide at [facility]. She states patient is scheduled at [facility] on 05/11/18 at 10:30am. She did confirm the order is for CT Entero abd/pelvis. I explained to her the pre-cert is for ct abd/pelvis but that covers the entero. She confirmed as well.   Called central scheduling and CT cancelled at [facility].

## 2018-04-17 NOTE — Telephone Encounter (Signed)
Alice from [facility] called to verify patient needs CT entero abd/pelvis with contrast. I advised her this was correct. She just wanted to verify.

## 2018-04-20 ENCOUNTER — Other Ambulatory Visit: Payer: Self-pay

## 2018-04-20 DIAGNOSIS — D509 Iron deficiency anemia, unspecified: Secondary | ICD-10-CM

## 2018-04-20 DIAGNOSIS — R718 Other abnormality of red blood cells: Secondary | ICD-10-CM

## 2018-05-04 ENCOUNTER — Ambulatory Visit (HOSPITAL_COMMUNITY): Payer: No Typology Code available for payment source

## 2018-05-04 ENCOUNTER — Ambulatory Visit: Payer: Self-pay | Admitting: Gastroenterology

## 2018-05-07 ENCOUNTER — Ambulatory Visit (HOSPITAL_COMMUNITY): Payer: No Typology Code available for payment source

## 2018-05-15 ENCOUNTER — Encounter: Payer: Self-pay | Admitting: Gastroenterology

## 2018-05-15 ENCOUNTER — Ambulatory Visit (INDEPENDENT_AMBULATORY_CARE_PROVIDER_SITE_OTHER): Payer: No Typology Code available for payment source | Admitting: Gastroenterology

## 2018-05-15 ENCOUNTER — Other Ambulatory Visit: Payer: Self-pay

## 2018-05-15 VITALS — BP 135/88 | HR 72 | Temp 97.1°F | Ht 65.0 in | Wt 230.4 lb

## 2018-05-15 DIAGNOSIS — K449 Diaphragmatic hernia without obstruction or gangrene: Secondary | ICD-10-CM | POA: Diagnosis not present

## 2018-05-15 DIAGNOSIS — R933 Abnormal findings on diagnostic imaging of other parts of digestive tract: Secondary | ICD-10-CM | POA: Diagnosis not present

## 2018-05-15 DIAGNOSIS — D509 Iron deficiency anemia, unspecified: Secondary | ICD-10-CM | POA: Diagnosis not present

## 2018-05-15 DIAGNOSIS — R7989 Other specified abnormal findings of blood chemistry: Secondary | ICD-10-CM | POA: Diagnosis not present

## 2018-05-15 NOTE — Patient Instructions (Signed)
1. Continue to avoid any anti-inflammatory medication like Advil, Aleve, Excedrin Migraine, Aspirin powders.  2. Hematology referral for iron deficiency. If you don't have an appointment within next four weeks, let me know and we will update your labs while we wait. 3. I will review your CD with the radiologist and let you know further recommendations.

## 2018-05-15 NOTE — Progress Notes (Signed)
Primary Care Physician: Mikey Kirschner, MD  Primary Gastroenterologist:  Garfield Cornea, MD   Chief Complaint  Patient presents with  . Celiac Disease    HPI: Rhonda Harris is a 58 y.o. female here for follow-up.  She was seen back in September 2019.  She has a history of abdominal pain, large hiatal hernia, dysphagia, and celiac disease (based on pathology in March 2015 although her serologies were negative at that time, presented with profound IDA).  She had an EGD in September for dysphagia and abdominal pain.  At the time she had noted marked improvement in her symptoms with Dexilant.  She was found to have a large hiatal hernia, nonobstructing Schatzki ring which was dilated.  Scattered mild mucosal changes found in second portion of duodenum, biopsies benign without features of sprue.  Of note she has already been on gluten-free diet for 4 years at the time of this EGD.  TTG was repeated and this was negative.  TTG was negative in 2015 as well. Patient's dysphagia resolved s/p dilation.  She had some blood work to evaluate abdominal pain in October.  She was found to have hemoglobin of 7.1, hematocrit 25.2, MCV 66.1.  Two years ago her hemoglobin was 14.3.  In 2015 when we first saw her she presented with a hemoglobin of 5.4, MCV 58.  Colonoscopy showed diverticulosis, 5 cm of the terminal ileum appeared cobblestoned, biopsy with prominent lymphoid aggregates.  Capsule study done March 23, 2018 with scattered lymphangiectasia's, small nonbleeding few erosions/ulcers around 1 hour 5 minutes 52 seconds.  Prominent fold versus benign-appearing polyp with erosion noted at 2 hours 37 minutes 24 seconds.  Abnormal villi of uncertain significance at 3 hours 26 minutes 46 seconds.  Possible few erosions of the terminal ileum.  Now on pantoprazole BID. No more hb. Occasional epigastric pain with radiation into left shoulder but less frequent than before. BMs normal. No melena, brbpr.  She has been back on iron for 3 months. Has not seen hematology as recommended for microcytic anemia, abnormal RBC morphology.   Patient underwent a CTE to evaluate a fold versus polyp seen on capsule endoscopy.  Because of insurance she had to have it done through [facility], report is available through care everywhere.  IMPRESSION: Small 1 cm calcification adjacent to a small bowel loop in the pelvis. While this could be an incidental calcification, the possibility of a small calcified gastrointestinal stromal tumor is not excluded. Recommend comparison with any prior imaging, and if not available consider CT scan of the pelvis in 6 months to assess for interval change.  Moderate to large hiatal hernia.  Current Outpatient Medications  Medication Sig Dispense Refill  . Calcium Carb-Cholecalciferol (CALCIUM + VITAMIN D3 PO) Take 1 tablet by mouth daily.    . ferrous sulfate 325 (65 FE) MG tablet Take 325 mg by mouth daily with breakfast.    . Multiple Vitamin (MULTIVITAMIN) tablet Take 2 tablets by mouth daily.     . ondansetron (ZOFRAN) 4 MG tablet Take 1 tablet (4 mg total) by mouth every 8 (eight) hours as needed for nausea or vomiting. 20 tablet 0  . pantoprazole (PROTONIX) 40 MG tablet Take 1 tablet (40 mg total) by mouth 2 (two) times daily before a meal. 60 tablet 5   No current facility-administered medications for this visit.     Allergies as of 05/15/2018  . (No Known Allergies)    ROS:  General: Negative for anorexia,  weight loss, fever, chills, fatigue, weakness. ENT: Negative for hoarseness, difficulty swallowing , nasal congestion. CV: Negative for chest pain, angina, palpitations, dyspnea on exertion, peripheral edema.  Respiratory: Negative for dyspnea at rest, dyspnea on exertion, cough, sputum, wheezing.  GI: See history of present illness. GU:  Negative for dysuria, hematuria, urinary incontinence, urinary frequency, nocturnal urination.  Endo: Negative for unusual  weight change.    Physical Examination:   BP 135/88   Pulse 72   Temp (!) 97.1 F (36.2 C) (Oral)   Ht 5' 5"  (1.651 m)   Wt 230 lb 6.4 oz (104.5 kg)   BMI 38.34 kg/m   General: Well-nourished, well-developed in no acute distress.  Eyes: No icterus. Mouth: Oropharyngeal mucosa moist and pink , no lesions erythema or exudate. Lungs: Clear to auscultation bilaterally.  Heart: Regular rate and rhythm, no murmurs rubs or gallops.  Abdomen: Bowel sounds are normal, nontender, nondistended, no hepatosplenomegaly or masses, no abdominal bruits or hernia , no rebound or guarding.   Extremities: No lower extremity edema. No clubbing or deformities. Neuro: Alert and oriented x 4   Skin: Warm and dry, no jaundice.   Psych: Alert and cooperative, normal mood and affect.  Labs:  Lab Results  Component Value Date   CREATININE 0.86 02/18/2018   BUN 13 02/18/2018   NA 139 02/18/2018   K 4.5 02/18/2018   CL 106 02/18/2018   CO2 27 02/18/2018   Lab Results  Component Value Date   ALT 15 02/18/2018   AST 20 02/18/2018   ALKPHOS 59 11/06/2015   BILITOT 0.3 02/18/2018   Lab Results  Component Value Date   WBC 4.8 04/08/2018   HGB 9.8 (L) 04/08/2018   HCT 33.8 (L) 04/08/2018   MCV 69.7 (L) 04/08/2018   PLT 289 04/08/2018     Imaging Studies: No results found.

## 2018-05-17 ENCOUNTER — Encounter: Payer: Self-pay | Admitting: Gastroenterology

## 2018-05-17 NOTE — Assessment & Plan Note (Signed)
Fortunately most of her upper GI symptoms are controlled on pantoprazole 40 mg twice daily.  Some of her postprandial epigastric pain added to her large hiatal hernia but for now we will continue to monitor.  We discussed potential need for surgical repair if symptoms are unmanageable.

## 2018-05-17 NOTE — Assessment & Plan Note (Signed)
Very pleasant 58 year old female with profound microcytic anemia suddenly found when she had abdominal pain a couple months ago.  Hemoglobin is 7.1, MCV 66.1.  Normal hemoglobin 2 years ago.  She had a similar presentation in 2015 when she presented with a hemoglobin of 5.4 and MCV of 58.  Of note, patient was diagnosed with celiac disease in 2015 based on small bowel biopsy, endoscopic and clinical picture.  Her TTG IgA was negative at the time.  She has been on a gluten-free diet since then, for over 4 years.  Recent EGD with abnormal small bowel findings, biopsy not supportive of celiac.  TTG again negative but again she has been on gluten-free diet for 4 years.  Colonoscopy and capsule study also performed to evaluate anemia.  She had some small nonbleeding erosions versus ulcers and a benign-appearing polyp with erosion in the setting of occasional NSAID use.  CTE done to evaluate possible polyp in small bowel, found to have a small 1 cm calcification adjacent to a small bowel loop in the pelvis possibly insignificant versus small calcified gastrointestinal stromal tumor.  Given microcytic anemia, abnormal RBC morphology have encouraged her to follow through with hematology consultation.  I will be reviewing her CTE images with the radiologist in the near future, further recommendations to follow.

## 2018-05-18 NOTE — Progress Notes (Signed)
cc'ed to pcp °

## 2018-05-25 ENCOUNTER — Telehealth: Payer: Self-pay | Admitting: Gastroenterology

## 2018-05-25 NOTE — Telephone Encounter (Signed)
Lmom, waiting on a return call.  

## 2018-05-25 NOTE — Telephone Encounter (Signed)
Please let patient know that I reviewed her CTE with radiologist, Dr. Tyron Russell. I also discussed case with Dr. Jena Gauss.   She has a small 1cm calcification adjacent to a small bowel loop in the pelvis. This one is new. She has at least 2 others that have been present since 06/2013 CT.  Recommendations are to follow with additional imaging in 3 months.  1. Per RMR, MRE Abd/pelvis in 3 months (must do imaging of pelvis to see the area of concern).  2. Hematology referral as recommended.  3. Ifobt.  4. Continue gluten free diet.  5. Repeat cbc, iron/tibc/ferritin in 2 months.

## 2018-05-25 NOTE — Telephone Encounter (Signed)
ON RECALL  °

## 2018-05-28 ENCOUNTER — Other Ambulatory Visit: Payer: Self-pay

## 2018-05-28 DIAGNOSIS — D509 Iron deficiency anemia, unspecified: Secondary | ICD-10-CM

## 2018-05-28 NOTE — Telephone Encounter (Signed)
Lab orders placed for 2 months.

## 2018-05-28 NOTE — Telephone Encounter (Signed)
Darl Pikes please NIC MRE Abd/pelvis in 3 months. Pt notified of results. Pt received a call from hematology and has to call them back. Pt will pick IFOB up.

## 2018-06-18 ENCOUNTER — Other Ambulatory Visit: Payer: Self-pay | Admitting: *Deleted

## 2018-06-18 ENCOUNTER — Encounter: Payer: Self-pay | Admitting: *Deleted

## 2018-06-18 DIAGNOSIS — D509 Iron deficiency anemia, unspecified: Secondary | ICD-10-CM

## 2018-07-21 LAB — CBC WITH DIFFERENTIAL/PLATELET
ABSOLUTE MONOCYTES: 454 {cells}/uL (ref 200–950)
BASOS PCT: 1 %
Basophils Absolute: 51 cells/uL (ref 0–200)
EOS PCT: 1.6 %
Eosinophils Absolute: 82 cells/uL (ref 15–500)
HEMATOCRIT: 45.4 % — AB (ref 35.0–45.0)
Hemoglobin: 15.2 g/dL (ref 11.7–15.5)
LYMPHS ABS: 1637 {cells}/uL (ref 850–3900)
MCH: 27.9 pg (ref 27.0–33.0)
MCHC: 33.5 g/dL (ref 32.0–36.0)
MCV: 83.5 fL (ref 80.0–100.0)
MPV: 12.7 fL — AB (ref 7.5–12.5)
Monocytes Relative: 8.9 %
NEUTROS ABS: 2876 {cells}/uL (ref 1500–7800)
Neutrophils Relative %: 56.4 %
Platelets: 239 10*3/uL (ref 140–400)
RBC: 5.44 10*6/uL — AB (ref 3.80–5.10)
RDW: 15.4 % — AB (ref 11.0–15.0)
Total Lymphocyte: 32.1 %
WBC: 5.1 10*3/uL (ref 3.8–10.8)

## 2018-07-21 LAB — IRON,TIBC AND FERRITIN PANEL
%SAT: 24 % (ref 16–45)
FERRITIN: 21 ng/mL (ref 16–232)
Iron: 91 ug/dL (ref 45–160)
TIBC: 376 ug/dL (ref 250–450)

## 2018-07-22 ENCOUNTER — Other Ambulatory Visit: Payer: Self-pay

## 2018-07-22 DIAGNOSIS — D649 Anemia, unspecified: Secondary | ICD-10-CM

## 2018-07-27 ENCOUNTER — Encounter: Payer: Self-pay | Admitting: Internal Medicine

## 2018-07-27 NOTE — Progress Notes (Signed)
PATIENT SCHEDULED  °

## 2018-07-30 ENCOUNTER — Telehealth: Payer: Self-pay | Admitting: Internal Medicine

## 2018-07-30 NOTE — Telephone Encounter (Signed)
RECALL FOR MRI ABD/PELVIS

## 2018-07-30 NOTE — Telephone Encounter (Signed)
Letter mailed

## 2018-08-07 ENCOUNTER — Telehealth: Payer: Self-pay | Admitting: Internal Medicine

## 2018-08-07 DIAGNOSIS — K6389 Other specified diseases of intestine: Secondary | ICD-10-CM

## 2018-08-07 NOTE — Telephone Encounter (Signed)
MRE Abd/pelvis with/without contrast for 1cm calcification adjacent to small bowel loop in pelvis, additional calcifications adjacent to small bowel loops noted (seen on prior CTE).

## 2018-08-07 NOTE — Telephone Encounter (Signed)
Pt received a letter to schedule her MRI abd/pelvis in April 2020. Please call [phone]

## 2018-08-07 NOTE — Telephone Encounter (Signed)
LSL, MRE wbd/pelvis is this w w/o contrast for small bowel mass? Thanks

## 2018-08-10 NOTE — Addendum Note (Signed)
Addended by: Inge Rise on: 08/10/2018 08:05 AM   Modules accepted: Orders

## 2018-08-11 NOTE — Telephone Encounter (Signed)
PA was approved via [employer]'s website. Auth# [id number] dates 08/11/2018-02/07/2019 for [facility].  Called [facility] and spoke with Manus Gunning. She see's order for pt and they will call her to schedule.   Called pt and LMOVM to make aware.

## 2018-08-11 NOTE — Addendum Note (Signed)
Addended by: Tommie Sams on: 08/11/2018 07:39 AM   Modules accepted: Orders

## 2018-08-14 ENCOUNTER — Other Ambulatory Visit: Payer: Self-pay | Admitting: Gastroenterology

## 2018-08-26 NOTE — Telephone Encounter (Signed)
Called [facility] as no appt has been scheduled for patient yet. Spoke with Clarise Cruz and she stated she will contact the pt to schedule

## 2018-09-02 NOTE — Telephone Encounter (Signed)
I called [facility] to follow up. I was advised by Drenda Freeze that they contacted patient to schedule and she advised them she would call back when she is ready to schedule. FYI to LSL

## 2018-09-16 NOTE — Telephone Encounter (Signed)
Noted  

## 2018-11-02 ENCOUNTER — Encounter: Payer: Self-pay | Admitting: Nurse Practitioner

## 2018-11-02 ENCOUNTER — Other Ambulatory Visit: Payer: Self-pay

## 2018-11-02 ENCOUNTER — Telehealth: Payer: Self-pay

## 2018-11-02 ENCOUNTER — Ambulatory Visit: Payer: No Typology Code available for payment source | Admitting: Nurse Practitioner

## 2018-11-02 VITALS — BP 131/85 | HR 73 | Temp 97.7°F | Ht 65.0 in | Wt 237.6 lb

## 2018-11-02 DIAGNOSIS — R933 Abnormal findings on diagnostic imaging of other parts of digestive tract: Secondary | ICD-10-CM

## 2018-11-02 DIAGNOSIS — D509 Iron deficiency anemia, unspecified: Secondary | ICD-10-CM | POA: Diagnosis not present

## 2018-11-02 DIAGNOSIS — R1013 Epigastric pain: Secondary | ICD-10-CM | POA: Diagnosis not present

## 2018-11-02 NOTE — Patient Instructions (Signed)
Your health issues we discussed today were:   Abnormal CT scan of your abdomen with previous abdominal pain and GERD (reflux/heartburn): 1. We will reorder, and try to obtain authorization again from your insurance, to complete an MRI of your abdomen to further evaluate the suspicious spot. 2. Continue taking Protonix (pantoprazole) for your GERD symptoms 3. Call us if you have any recurrent abdominal pain or worsening/severe symptoms  Anemia (low blood levels): 1. Have your labs checked when you are able to 2. Further recommendations will follow your lab results 3. Notify us if you see any bleeding in your bowel movements  Overall I recommend:  1. Continue your other current medications 2. Follow-up in 3 months 3. Call us if you have any questions or concerns.   Because of recent events of COVID-19 ("Coronavirus"), follow CDC recommendations:  Wash your hand frequently Avoid touching your face Stay away from people who are sick If you have symptoms such as fever, cough, shortness of breath then call your healthcare provider for further guidance If you are sick, STAY AT HOME unless otherwise directed by your healthcare provider. Follow directions from state and national officials regarding staying safe   At [facility] we value your feedback. You may receive a survey about your visit today. Please share your experience as we strive to create trusting relationships with our patients to provide genuine, compassionate, quality care.  We appreciate your understanding and patience as we review any laboratory studies, imaging, and other diagnostic tests that are ordered as we care for you. Our office policy is 5 business days for review of these results, and any emergent or urgent results are addressed in a timely manner for your best interest. If you do not hear from our office in 1 week, please contact us.   We also encourage the use of MyChart, which contains your medical  information for your review as well. If you are not enrolled in this feature, an access code is on this after visit summary for your convenience. Thank you for allowing [place] to be involved in your care.  It was great to see you today!  I hope you have a great summer!!

## 2018-11-02 NOTE — Assessment & Plan Note (Signed)
Previously iron deficiency anemia resolved while taking oral iron.  Based on previous recommendations we will recheck her CBC, iron, ferritin, TIBC.  She has not had her MRI done.  I will have staff check to see if the prior authorization is still good.  If not we will need to reorder and obtain another prior off.  Delay was likely, at least in part, due to COVID-19/coronavirus pandemic.  We will also call [facility] radiology and asked him to please call the patient and schedule.  Follow-up recommendations to follow.  In the meantime we will plan to schedule her for 67-monthfollow-up.

## 2018-11-02 NOTE — Progress Notes (Signed)
Referring Provider: Merlyn Albert, MD Primary Care Physician:  Merlyn Albert, MD Primary GI:  Dr. Jena Gauss  Chief Complaint  Patient presents with  . Anemia    f/u.    HPI:   Rhonda Harris is a 58 y.o. female who presents for follow-up.  The patient was last seen in our office 05/15/2018 for microcytic anemia, abnormal CT, abnormal CBC, large hiatal hernia.  Known history of abdominal pain with large hiatal hernia, dysphasia, celiac disease based on March 2015 pathology although serologies negative at that time she presented with profound IDA.  EGD in September for dysphasia and abdominal pain which noted market improvement in her symptoms with Dexilant.  Found to have large hiatal hernia, nonobstructing Schatzki's ring status post dilation.  Also scattered mild mucosal changes in the second portion of the duodenum with biopsies benign and no features of sprue.  Had been on gluten-free diet for 4 years at the time of EGD.  TTG was repeated and it was negative.  Dysphagia resolved status post dilation.  October 2019 further blood work to evaluate abdominal pain and found that hemoglobin is 7.1 with a hematocrit of 25.2 and MCV of 66.1.  2 years prior hemoglobin was found to be 14.3.  In 2015 at initial appointment hemoglobin was 5.4 and MCV 58.  Colonoscopy showed diverticulosis and 5 cm of terminal ileum.  Cobblestone with biopsy finding prominent lymphoid aggregates.  Capsule study completed 03/23/2018 with scattered lymphangiectasia's, small nonbleeding few erosions/ulcers around 1 hour and 5 minutes 52 seconds.  Prominent fold versus benign-appearing polyp with erosion noted at 2 hours 37 minutes 24 seconds.  Abnormal villi of uncertain significance at 3 hours 26 minutes 46 seconds.  Possible few erosions in the terminal ileum.  At her last visit noted no further heartburn on twice daily pantoprazole.  Occasional epigastric pain with left shoulder radiation but less frequent.  BMs  normal.  No hematochezia or melena.  Has been back on iron for 3 months.  Has yet to see hematology as recommended for microcytic anemia and abnormal RBC morphology.  CTE to evaluate fold versus polyp on capsule endoscopy which found a small 1 cm calcification adjacent to small bowel loop in the pelvis which could be incidental but the possibility of a small calcified gastrointestinal stromal tumor is not excluded and recommended comparison to any prior imaging and if not available consider CT scan of the pelvis in 6 months to assess for interval change.  Also noted moderate to large hiatal hernia.  After her last visit it was recommended to avoid NSAIDs, follow-up with hematology and if no labs were then 4 weeks we will update labs.  Further recommendations to follow.  CT reviewed with radiologist and Dr. Jena Gauss.  The small 1 cm calcification is new and has had at least 2 others that have been present since 06/2013 CT.  Recommended follow-up imaging in 3 months.  Per RMR, recommended MRE of the abdomen and pelvis in 3 months, hematology referral as previously recommended, gluten-free diet, iFOBT, repeat CBC, iron/TIBC/ferritin in 2 months.  [facility] radiology attempted to contact appointment patient indicated she would have it scheduled when she was ready to.  Repeat CBC found H/H and MCV now normal, ferritin low normal.  Recommended continue iron.  Patient apparently refused hematology referral per appointment info.  MRE in April as planned with office visit with Dr. Jena Gauss only after MRI completed.  Repeat CBC, ferritin in 4 months given history  of intermittent significant anemia.  MRE has yet to be completed although this could be partially due to COVID-19/coronavirus pandemic.  Today she states she's doing well overall. 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 Denies abdominal pain at this time, N/V, hematochezia, melena, fever, chills, unintentional weight loss. She does still have a  left shoulder pain typically postprandially, although it isn't consistent; occurs "every once in a while" and doesn't take anything for it. Denies URI or flu-like symptoms. Denies loss of sense of taste or smell. Denies chest pain, dyspnea, dizziness, lightheadedness, syncope, near syncope. Denies any other upper or lower GI symptoms.  Has not had MRI completed yet.  Past Medical History:  Diagnosis Date  . Celiac disease March 2015  . Esophageal erosions   . GERD (gastroesophageal reflux disease)     Past Surgical History:  Procedure Laterality Date  . BIOPSY  02/04/2018   Procedure: BIOPSY;  Surgeon: Corbin Ade, MD;  [facility]: [facility];  Service: Endoscopy;;  duodenal bx's  . BIOPSY  02/25/2018   Procedure: BIOPSY;  Surgeon: Corbin Ade, MD;  Location: [facility];  Service: Endoscopy;;  ileum  . COLONOSCOPY N/A 02/25/2018   Dr. Jena Gauss: Diverticulosis, 5 cm of the terminal ileum appeared eroded and slightly cobblestoned.  Ileal biopsy with prominent lymphoid aggregates, negative for dysplasia.  . COLONOSCOPY WITH ESOPHAGOGASTRODUODENOSCOPY (EGD) N/A 07/12/2013   Dr. Jena Gauss: EGD revealed erosive reflux esophagitis, large hiatal hernia, antral erosions, abnormal duodenum with path revealing partially developed celiac sprue. Celiac serologies normal but felt to be dealing with celiac disease. Colonoscopy with ileal erosions, negative path.   . ESOPHAGOGASTRODUODENOSCOPY N/A 02/04/2018   Dr. Jena Gauss: Nonobstructing Schatzki ring status post dilation.  Large hiatal hernia.  Scattered mild mucosal changes in the second portion of the duodenum, slightly frondy but no scalloping.  Small bowel biopsy with mild hyperemia and edema, no features of sprue, active inflammation or granulomas.  Of note, patient was on a gluten-free diet for 4 years prior to this biopsy.  Marland Kitchen GIVENS CAPSULE STUDY N/A 03/23/2018   Few erosions/?  Ulcerations noted.  Abnormal villi towards distal small intestines of  uncertain significance.  Prominent fold versus benign-appearing polyp with erosion noted at 2 hours 37 minutes 24 seconds.  Marland Kitchen MALONEY DILATION N/A 02/04/2018   Procedure: Elease Hashimoto DILATION;  Surgeon: Corbin Ade, MD;  Location: [facility];  Service: Endoscopy;  Laterality: N/A;  . TUBAL LIGATION      Current Outpatient Medications  Medication Sig Dispense Refill  . Calcium Carb-Cholecalciferol (CALCIUM + VITAMIN D3 PO) Take 1 tablet by mouth daily.    . ferrous sulfate 325 (65 FE) MG tablet Take 325 mg by mouth daily with breakfast.    . Multiple Vitamin (MULTIVITAMIN) tablet Take 2 tablets by mouth daily.     . ondansetron (ZOFRAN) 4 MG tablet Take 1 tablet (4 mg total) by mouth every 8 (eight) hours as needed for nausea or vomiting. 20 tablet 0  . pantoprazole (PROTONIX) 40 MG tablet TAKE ONE TABLET (40MG  TOTAL) BY MOUTH TWO TIMES DAILY BEFORE A MEAL 60 tablet 5   No current facility-administered medications for this visit.     Allergies as of 11/02/2018  . (No Known Allergies)    Family History  Problem Relation Age of Onset  . Osteoporosis Mother   . Cancer Father 7       pancreatic  . Colon cancer Neg Hx   . Gastric  cancer Neg Hx   . Cancer - Other Neg Hx        small bowel    Social History   Socioeconomic History  . Marital status: Married    Spouse name: Not on file  . Number of children: Not on file  . Years of education: Not on file  . Highest education level: Not on file  Occupational History  . Not on file  Social Needs  . Financial resource strain: Not on file  . Food insecurity    Worry: Not on file    Inability: Not on file  . Transportation needs    Medical: Not on file    Non-medical: Not on file  Tobacco Use  . Smoking status: Former Smoker    Quit date: 07/02/1993    Years since quitting: 25.3  . Smokeless tobacco: Never Used  . Tobacco comment: only smoked about 2 years as teenager  Substance and Sexual Activity  . Alcohol use: Yes     Comment: rare social   . Drug use: No  . Sexual activity: Yes    Birth control/protection: Post-menopausal, Surgical  Lifestyle  . Physical activity    Days per week: Not on file    Minutes per session: Not on file  . Stress: Not on file  Relationships  . Social Herbalist on phone: Not on file    Gets together: Not on file    Attends religious service: Not on file    Active member of club or organization: Not on file    Attends meetings of clubs or organizations: Not on file    Relationship status: Not on file  Other Topics Concern  . Not on file  Social History Narrative  . Not on file    Review of Systems: General: Negative for anorexia, weight loss, fever, chills, fatigue, weakness. ENT: Negative for hoarseness, difficulty swallowing. CV: Negative for chest pain, angina, palpitations, peripheral edema.  Respiratory: Negative for dyspnea at rest, cough, sputum, wheezing.  GI: See history of present illness. Endo: Negative for unusual weight change.  Heme: Negative for bruising or bleeding. Allergy: Negative for rash or hives.   Physical Exam: BP 131/85   Pulse 73   Temp 97.7 F (36.5 C) (Oral)   Ht 5\' 5"  (1.651 m)   Wt 237 lb 9.6 oz (107.8 kg)   BMI 39.54 kg/m  General:   Alert and oriented. Pleasant and cooperative. Well-nourished and well-developed.  Eyes:  Without icterus, sclera clear and conjunctiva pink.  Ears:  Normal auditory acuity. Cardiovascular:  S1, S2 present without murmurs appreciated. Extremities without clubbing or edema. Respiratory:  Clear to auscultation bilaterally. No wheezes, rales, or rhonchi. No distress.  Gastrointestinal:  +BS, soft, non-tender and non-distended. No HSM noted. No guarding or rebound. No masses appreciated.  Rectal:  Deferred  Musculoskalatal:  Symmetrical without gross deformities. Skin:  Intact without significant lesions or rashes. Neurologic:  Alert and oriented x4;  grossly normal neurologically. Psych:   Alert and cooperative. Normal mood and affect. Heme/Lymph/Immune: No excessive bruising noted.    11/02/2018 8:22 AM   Disclaimer: This note was dictated with voice recognition software. Similar sounding words can inadvertently be transcribed and may not be corrected upon review.

## 2018-11-02 NOTE — Assessment & Plan Note (Signed)
No further epigastric abdominal pain.  She does occasionally have left shoulder pain that is intermittent and only occurs "every once in a while."  It is typically postprandial.  She is on Protonix twice daily and is satisfied with her result.  Recommend she continue her current medications and follow-up in 3 months.  We may need to plan for earlier follow-up pending lab and imaging results.

## 2018-11-02 NOTE — Telephone Encounter (Signed)
Pt seen by EG this am. EG advised for her to complete MRI entero abd/pelvis as previously ordered at [facility]. MRI entero abd/pelvis PA# [id number] valid 08/11/18-02/07/19. Called GI and spoke to [place], she advised pt can call them to schedule. Called and informed pt, gave her phone number for GI.

## 2018-11-02 NOTE — Progress Notes (Signed)
cc'ed to pcp °

## 2018-11-02 NOTE — Assessment & Plan Note (Signed)
Abnormal CT scan with a 1 cm calcification that is new and cannot rule out GIST based on CTE.  She was recommended to have an MRE but this is yet to be completed.  We will again have [facility] contact the patient for scheduling.  Further recommendations and follow-up with Dr. Gala Romney to follow-up after completion of MRE

## 2018-11-03 ENCOUNTER — Other Ambulatory Visit (HOSPITAL_COMMUNITY): Payer: Self-pay | Admitting: Sports Medicine

## 2018-11-03 ENCOUNTER — Ambulatory Visit: Payer: Self-pay | Admitting: Internal Medicine

## 2018-11-03 ENCOUNTER — Other Ambulatory Visit: Payer: Self-pay | Admitting: Sports Medicine

## 2018-11-03 DIAGNOSIS — R2241 Localized swelling, mass and lump, right lower limb: Secondary | ICD-10-CM

## 2018-11-03 DIAGNOSIS — M79604 Pain in right leg: Secondary | ICD-10-CM

## 2018-11-03 LAB — CBC WITH DIFFERENTIAL/PLATELET
Absolute Monocytes: 430 cells/uL (ref 200–950)
Basophils Absolute: 39 cells/uL (ref 0–200)
Basophils Relative: 0.9 %
Eosinophils Absolute: 69 cells/uL (ref 15–500)
Eosinophils Relative: 1.6 %
HCT: 42.3 % (ref 35.0–45.0)
Hemoglobin: 14.4 g/dL (ref 11.7–15.5)
Lymphs Abs: 1221 cells/uL (ref 850–3900)
MCH: 30.8 pg (ref 27.0–33.0)
MCHC: 34 g/dL (ref 32.0–36.0)
MCV: 90.6 fL (ref 80.0–100.0)
MPV: 12.1 fL (ref 7.5–12.5)
Monocytes Relative: 10 %
Neutro Abs: 2541 cells/uL (ref 1500–7800)
Neutrophils Relative %: 59.1 %
Platelets: 223 10*3/uL (ref 140–400)
RBC: 4.67 10*6/uL (ref 3.80–5.10)
RDW: 12 % (ref 11.0–15.0)
Total Lymphocyte: 28.4 %
WBC: 4.3 10*3/uL (ref 3.8–10.8)

## 2018-11-03 LAB — IRON,?TOTAL/TOTAL IRON BINDING CAP: Iron: 66 ug/dL (ref 45–160)

## 2018-11-03 LAB — IRON, TOTAL/TOTAL IRON BINDING CAP
%SAT: 19 % (calc) (ref 16–45)
TIBC: 339 mcg/dL (calc) (ref 250–450)

## 2018-11-03 LAB — FERRITIN: Ferritin: 40 ng/mL (ref 16–232)

## 2018-11-09 ENCOUNTER — Ambulatory Visit (HOSPITAL_COMMUNITY)
Admission: RE | Admit: 2018-11-09 | Discharge: 2018-11-09 | Disposition: A | Discharge status: Home / Self Care | Payer: No Typology Code available for payment source | Source: Ambulatory Visit | Attending: Sports Medicine | Admitting: Sports Medicine

## 2018-11-09 ENCOUNTER — Ambulatory Visit (HOSPITAL_COMMUNITY): Payer: No Typology Code available for payment source

## 2018-11-09 ENCOUNTER — Other Ambulatory Visit: Payer: Self-pay

## 2018-11-09 DIAGNOSIS — M79604 Pain in right leg: Secondary | ICD-10-CM | POA: Diagnosis not present

## 2018-11-09 DIAGNOSIS — R2241 Localized swelling, mass and lump, right lower limb: Secondary | ICD-10-CM | POA: Diagnosis present

## 2018-11-23 ENCOUNTER — Ambulatory Visit
Admission: RE | Admit: 2018-11-23 | Discharge: 2018-11-23 | Disposition: A | Discharge status: Home / Self Care | Payer: No Typology Code available for payment source | Source: Ambulatory Visit | Attending: Gastroenterology | Admitting: Gastroenterology

## 2018-11-23 DIAGNOSIS — K6389 Other specified diseases of intestine: Secondary | ICD-10-CM

## 2018-11-23 MED ORDER — GADOBENATE DIMEGLUMINE 529 MG/ML IV SOLN
20.0000 mL | Freq: Once | INTRAVENOUS | Status: AC | PRN
  Administered 2018-11-23: 20 mL via INTRAVENOUS

## 2018-12-16 ENCOUNTER — Other Ambulatory Visit (HOSPITAL_COMMUNITY): Payer: Self-pay

## 2018-12-16 ENCOUNTER — Ambulatory Visit
Admission: RE | Admit: 2018-12-16 | Discharge: 2018-12-16 | Disposition: A | Discharge status: Home / Self Care | Payer: Self-pay | Source: Ambulatory Visit

## 2018-12-16 DIAGNOSIS — R1084 Generalized abdominal pain: Secondary | ICD-10-CM

## 2019-01-22 ENCOUNTER — Other Ambulatory Visit (HOSPITAL_COMMUNITY): Payer: Self-pay | Admitting: Family Medicine

## 2019-01-22 DIAGNOSIS — Z1231 Encounter for screening mammogram for malignant neoplasm of breast: Secondary | ICD-10-CM

## 2019-02-01 ENCOUNTER — Ambulatory Visit (HOSPITAL_COMMUNITY)
Admission: RE | Admit: 2019-02-01 | Discharge: 2019-02-01 | Disposition: A | Discharge status: Home / Self Care | Payer: No Typology Code available for payment source | Source: Ambulatory Visit | Attending: Family Medicine | Admitting: Family Medicine

## 2019-02-01 ENCOUNTER — Other Ambulatory Visit: Payer: Self-pay

## 2019-02-01 ENCOUNTER — Encounter: Payer: Self-pay | Admitting: Gastroenterology

## 2019-02-01 ENCOUNTER — Ambulatory Visit (INDEPENDENT_AMBULATORY_CARE_PROVIDER_SITE_OTHER): Payer: No Typology Code available for payment source | Admitting: Gastroenterology

## 2019-02-01 VITALS — BP 131/82 | HR 66 | Temp 97.1°F | Ht 65.0 in | Wt 232.8 lb

## 2019-02-01 DIAGNOSIS — K2100 Gastro-esophageal reflux disease with esophagitis, without bleeding: Secondary | ICD-10-CM

## 2019-02-01 DIAGNOSIS — Z1231 Encounter for screening mammogram for malignant neoplasm of breast: Secondary | ICD-10-CM | POA: Diagnosis present

## 2019-02-01 DIAGNOSIS — K900 Celiac disease: Secondary | ICD-10-CM | POA: Diagnosis not present

## 2019-02-01 DIAGNOSIS — D509 Iron deficiency anemia, unspecified: Secondary | ICD-10-CM

## 2019-02-01 DIAGNOSIS — R933 Abnormal findings on diagnostic imaging of other parts of digestive tract: Secondary | ICD-10-CM | POA: Diagnosis not present

## 2019-02-01 NOTE — Assessment & Plan Note (Signed)
Initially diagnosed based on biopsies in 2015.  Interestingly her serologies were negative at that time.  She maintain gluten-free diet for 4 years, repeat EGD in 2019 with normal small bowel biopsies.  Again TTG negative.  Currently not 100% gluten-free.  Feels great. Will recheck labs in 04/2019 (anemia and TTG).

## 2019-02-01 NOTE — Assessment & Plan Note (Signed)
Abnormal CT with 1 cm calcification new, cannot exclude GIST.  Follow-up MRE as outlined.  Recommend CTE for better comparison in 6 months (February 2021).

## 2019-02-01 NOTE — Progress Notes (Signed)
cc'ed to pcp °

## 2019-02-01 NOTE — Assessment & Plan Note (Signed)
Clinically doing well with regards to her reflux.  Continue pantoprazole 40 mg once to twice daily as needed before meals.  Return to the office in 1 year.

## 2019-02-01 NOTE — Progress Notes (Signed)
Primary Care Physician: Mikey Kirschner, MD  Primary Gastroenterologist:  Garfield Cornea, MD   Chief Complaint  Patient presents with   Anemia    doing ok    HPI: Rhonda Harris is a 58 y.o. female here for follow up. Last seen in 10/2018.  She has a history of abdominal pain, large hiatal hernia, dysphagia, IDA, celiac disease (based on pathology in March 2015 although her serologies were negative at that time, presented with profound IDA with hemoglobin of 5.4 and MCV 58).  Last EGD in September 2019 for dysphagia and abdominal pain.  Large hiatal hernia, nonobstructing Schatzki ring which was dilated.  Scattered mild mucosal changes found in the second portion duodenum, biopsies benign without features of sprue.  Of note she basically has been on gluten-free diet for 4 years at time of this EGD.  TTG was repeated and was negative.  TTG negative in 2015 as well. Blood work in October 2019, found to have microcytic anemia again with hemoglobin 7.1, MCV 66.1.  Colonoscopy October 2019 showed diverticulosis, 5 cm of the terminal ileum appeared eroded and slightly cobblestoned.  Ileal biopsy with prominent lymphoid aggregates but negative for dysplasia.  Capsule study done November 2019 with scattered lymphangiectasia's, small nonbleeding few erosions/ulcers around 1 hour 5 minutes 52 seconds.  Prominent fold versus benign-appearing polyp with erosion noted at 2 hours 37 minutes 24 seconds.  Abnormal villi of uncertain significance at 3 hours 26 minutes 46 seconds.  Possible few erosions of the TI.  This led to a CT to evaluate fold versus polyp.  She was noted to have a small 1 cm calcification adjacent to a small bowel loop in the pelvis which could be incidental but possibility of small calcified GIST not excluded.  Currently has 2 other calcifications that have been present since 2015 on CT.  MRE abdomen pelvis July 2020 "Focal indeterminate area of decreased T2 signal and increased is  noted within the lower abdomen/pelvis corresponding to the area of calcification noted on previous exam. This may represent the calcification seen on previous CT of the abdomen pelvis. This does not appear to be changed in size. However, because calcifications will show up as signal void artifact similar to bowel gas, CT enterography would be a better modality for following up calcifications associated with the bowel".  Plans for repeat CTE in 6 months to reassess small 1 cm calcification adjacent to small bowel loop in the pelvis.  Her last labs in June indicated normal hemoglobin 14.4, MCV 90.6, ferritin 40, iron 66, TIBC 339.  Clinically she feels well.  Denies any abdominal pain.  She does not follow a complete gluten-free diet since her endoscopy last year looked good.  Currently taking pantoprazole every morning and only in the evenings if needed for reflux management.  Bowel movements are regular.  No blood in the stool or melena.  No dysphagia.             Current Outpatient Medications  Medication Sig Dispense Refill   Calcium Carb-Cholecalciferol (CALCIUM + VITAMIN D3 PO) Take 1 tablet by mouth daily.     ferrous sulfate 325 (65 FE) MG tablet Take 325 mg by mouth daily with breakfast.     Multiple Vitamin (MULTIVITAMIN) tablet Take 2 tablets by mouth daily.      ondansetron (ZOFRAN) 4 MG tablet Take 1 tablet (4 mg total) by mouth every 8 (eight) hours as needed for nausea or vomiting. 20 tablet  0   pantoprazole (PROTONIX) 40 MG tablet TAKE ONE TABLET (40MG TOTAL) BY MOUTH TWO TIMES DAILY BEFORE A MEAL 60 tablet 5   No current facility-administered medications for this visit.     Allergies as of 02/01/2019   (No Known Allergies)    ROS:  General: Negative for anorexia, weight loss, fever, chills, fatigue, weakness. ENT: Negative for hoarseness, difficulty swallowing , nasal congestion. CV: Negative for chest pain, angina, palpitations, dyspnea on exertion,  peripheral edema.  Respiratory: Negative for dyspnea at rest, dyspnea on exertion, cough, sputum, wheezing.  GI: See history of present illness. GU:  Negative for dysuria, hematuria, urinary incontinence, urinary frequency, nocturnal urination.  Endo: Negative for unusual weight change.    Physical Examination:   BP 131/82    Pulse 66    Temp (!) 97.1 F (36.2 C) (Temporal)    Ht 5' 5"  (1.651 m)    Wt 232 lb 12.8 oz (105.6 kg)    BMI 38.74 kg/m   General: Well-nourished, well-developed in no acute distress.  Eyes: No icterus. Mouth: Oropharyngeal mucosa moist and pink , no lesions erythema or exudate. Lungs: Clear to auscultation bilaterally.  Heart: Regular rate and rhythm, no murmurs rubs or gallops.  Abdomen: Bowel sounds are normal, nontender, nondistended, no hepatosplenomegaly or masses, no abdominal bruits or hernia , no rebound or guarding.   Extremities: No lower extremity edema. No clubbing or deformities. Neuro: Alert and oriented x 4   Skin: Warm and dry, no jaundice.   Psych: Alert and cooperative, normal mood and affect.  Labs:  Lab Results  Component Value Date   CREATININE 0.86 02/18/2018   BUN 13 02/18/2018   NA 139 02/18/2018   K 4.5 02/18/2018   CL 106 02/18/2018   CO2 27 02/18/2018   Lab Results  Component Value Date   ALT 15 02/18/2018   AST 20 02/18/2018   ALKPHOS 59 11/06/2015   BILITOT 0.3 02/18/2018   Lab Results  Component Value Date   WBC 4.3 11/02/2018   HGB 14.4 11/02/2018   HCT 42.3 11/02/2018   MCV 90.6 11/02/2018   PLT 223 11/02/2018   Lab Results  Component Value Date   IRON 66 11/02/2018   TIBC 339 11/02/2018   FERRITIN 40 11/02/2018      Imaging Studies: No results found.

## 2019-02-01 NOTE — Patient Instructions (Addendum)
1. We will recheck your anemia labs in 3 months.  2. You will be due follow up CT of your small bowel in 06/2019.  3. Continue pantoprazole once to twice daily as needed for reflux. 4. We will see you back in one year. Call sooner if you need anything!

## 2019-02-01 NOTE — Assessment & Plan Note (Signed)
History of profound IDA presenting in 2015 and 2019 as outlined.  Last labs in June looked great.  We will continue to follow.  Plan to recheck anemia labs in 3 months.  We will see her back in a year.

## 2019-03-30 ENCOUNTER — Telehealth: Payer: Self-pay | Admitting: Internal Medicine

## 2019-03-30 NOTE — Telephone Encounter (Signed)
Patient on recall for labs

## 2019-04-02 NOTE — Telephone Encounter (Signed)
Mailing letter to pt

## 2019-04-12 ENCOUNTER — Other Ambulatory Visit: Payer: Self-pay

## 2019-04-12 DIAGNOSIS — D509 Iron deficiency anemia, unspecified: Secondary | ICD-10-CM

## 2019-04-21 ENCOUNTER — Other Ambulatory Visit: Payer: Self-pay

## 2019-04-21 DIAGNOSIS — Z20822 Contact with and (suspected) exposure to covid-19: Secondary | ICD-10-CM

## 2019-04-22 ENCOUNTER — Telehealth: Payer: Self-pay

## 2019-04-22 LAB — NOVEL CORONAVIRUS, NAA: SARS-CoV-2, NAA: DETECTED — AB

## 2019-04-22 NOTE — Telephone Encounter (Signed)
Checking on COVID 19 results, not available yet. 

## 2019-04-28 NOTE — Telephone Encounter (Signed)
Per pt. Request, COVID test result to be faxed to [facility] @ [phone] this afternoon; sent this request to [facility].

## 2019-04-28 NOTE — Telephone Encounter (Signed)
Pt called and stated that she would like results faxed over to job.   [employer] [phone]

## 2019-04-29 ENCOUNTER — Telehealth: Payer: Self-pay | Admitting: Family Medicine

## 2019-04-29 NOTE — Telephone Encounter (Signed)
Please go ahead and give work note for the stated dates may return to work on 05/01/2019 without restrictions please put on the work note that the patient had tested positive for Covid

## 2019-04-29 NOTE — Telephone Encounter (Signed)
Patient needing work note  from 04/21/19 testing positive for Covid and returning 05/01/19. Her number [phone]

## 2019-04-29 NOTE — Telephone Encounter (Signed)
Left message to return call to get more info

## 2019-04-29 NOTE — Telephone Encounter (Signed)
Pt was tested at [facility] on the 9th. No symptoms at this time. Pt is going back to work on Saturday. Pt had symptoms the week before getting tested. Quarantine time is up tomorrow. Pt would like to come by and pick it up. Pt would like to have this tomorrow. Please advise. Thank you

## 2019-04-30 ENCOUNTER — Other Ambulatory Visit: Payer: No Typology Code available for payment source

## 2019-04-30 ENCOUNTER — Encounter: Payer: Self-pay | Admitting: Family Medicine

## 2019-05-18 LAB — CBC WITH DIFFERENTIAL/PLATELET
Absolute Monocytes: 583 cells/uL (ref 200–950)
Basophils Absolute: 21 cells/uL (ref 0–200)
Basophils Relative: 0.4 %
Eosinophils Absolute: 48 cells/uL (ref 15–500)
Eosinophils Relative: 0.9 %
HCT: 45.6 % — ABNORMAL HIGH (ref 35.0–45.0)
Hemoglobin: 15.3 g/dL (ref 11.7–15.5)
Lymphs Abs: 1993 cells/uL (ref 850–3900)
MCH: 30.1 pg (ref 27.0–33.0)
MCHC: 33.6 g/dL (ref 32.0–36.0)
MCV: 89.6 fL (ref 80.0–100.0)
MPV: 11.7 fL (ref 7.5–12.5)
Monocytes Relative: 11 %
Neutro Abs: 2655 cells/uL (ref 1500–7800)
Neutrophils Relative %: 50.1 %
Platelets: 210 10*3/uL (ref 140–400)
RBC: 5.09 10*6/uL (ref 3.80–5.10)
RDW: 11.7 % (ref 11.0–15.0)
Total Lymphocyte: 37.6 %
WBC: 5.3 10*3/uL (ref 3.8–10.8)

## 2019-05-18 LAB — IRON,TIBC AND FERRITIN PANEL
%SAT: 18 % (calc) (ref 16–45)
Ferritin: 48 ng/mL (ref 16–232)
Iron: 62 ug/dL (ref 45–160)
TIBC: 345 mcg/dL (calc) (ref 250–450)

## 2019-05-18 LAB — TISSUE TRANSGLUTAMINASE ABS,IGG,IGA
(tTG) Ab, IgA: 1 U/mL
(tTG) Ab, IgG: 1 U/mL

## 2019-06-03 ENCOUNTER — Other Ambulatory Visit: Payer: Self-pay | Admitting: Nurse Practitioner

## 2019-06-15 ENCOUNTER — Ambulatory Visit (INDEPENDENT_AMBULATORY_CARE_PROVIDER_SITE_OTHER): Payer: No Typology Code available for payment source | Admitting: Family Medicine

## 2019-06-15 DIAGNOSIS — G43009 Migraine without aura, not intractable, without status migrainosus: Secondary | ICD-10-CM

## 2019-06-15 MED ORDER — SUMATRIPTAN SUCCINATE 50 MG PO TABS: ORAL_TABLET | ORAL | 0 refills | Status: DC

## 2019-06-15 MED ORDER — ONDANSETRON 4 MG PO TBDP: 4.0000 mg | ORAL_TABLET | Freq: Three times a day (TID) | ORAL | 0 refills | Status: DC | PRN

## 2019-06-15 NOTE — Progress Notes (Signed)
   Subjective:  Audio  Patient ID: Rhonda Harris, female    DOB: November 04, 1960, 59 y.o.   MRN: [id number]  HPI  Patient calls with headache for 3 days. Patient states she has a history of migraine headaches but this one has lasted 3 days.  Virtual Visit via Video Note  I connected with Rhonda Harris on 06/15/19 at  3:00 PM EST by a video enabled telemedicine application and verified that I am speaking with the correct person using two identifiers.  Location: Patient: home Provider: office   I discussed the limitations of evaluation and management by telemedicine and the availability of in person appointments. The patient expressed understanding and agreed to proceed.  History of Present Illness:    Observations/Objective:   Assessment and Plan:   Follow Up Instructions:    I discussed the assessment and treatment plan with the patient. The patient was provided an opportunity to ask questions and all were answered. The patient agreed with the plan and demonstrated an understanding of the instructions.   The patient was advised to call back or seek an in-person evaluation if the symptoms worsen or if the condition fails to improve as anticipated.  I provided 20 minutes of non-face-to-face time during this encounter.  h a really bad  Hx of migr   photophob  And phonophob  Triggers none  No vom        Review of Systems  No headache, no major weight loss or weight gain, no chest pain no back pain abdominal pain no change in bowel habits complete ROS otherwise negative     Objective:   Physical Exam Virtual       Assessment & Plan:  Impression Common migraine headaches.  1 every 1 to 2 weeks.  Something a fairly severe.  Options discussed.  Initiate Imitrex plus Zofran plus ibuprofen for silent headaches.  Rationale discussed

## 2019-06-16 ENCOUNTER — Encounter: Payer: Self-pay | Admitting: Family Medicine

## 2019-06-28 ENCOUNTER — Telehealth: Payer: Self-pay | Admitting: Internal Medicine

## 2019-06-28 NOTE — Telephone Encounter (Signed)
Letter mailed

## 2019-06-28 NOTE — Telephone Encounter (Signed)
RECALL FOR CTE

## 2019-07-23 ENCOUNTER — Other Ambulatory Visit: Payer: Self-pay | Admitting: Family Medicine

## 2019-12-19 IMAGING — MR MR (ENTEROGRAPHY) OF THE ABDOMEN WITH CONTRAST
9 of 15 series · 28 of 48 positions shown · IV contrast (multihance)
Comparison: CT AP 07/11/2013
COMPARISON: CT AP 07/11/2013

Addendum:
CLINICAL DATA: One year history of abdominal pain.

EXAM:
MR ABDOMEN AND PELVIS WITHOUT AND WITH CONTRAST (MR ENTEROGRAPHY)
TECHNIQUE: Multiplanar, multisequence MRI of the abdomen and pelvis was
performed both before and during bolus administration of intravenous
contrast. Negative oral contrast VoLumen was given.
CONTRAST:  20mL MULTIHANCE GADOBENATE DIMEGLUMINE 529 MG/ML IV SOLN

[Series 4: axial haste · axial · 6.0mm · 0.74mm/px · z∈[-163,+233]mm · 3 of 56 slices shown]
[im 1/56]
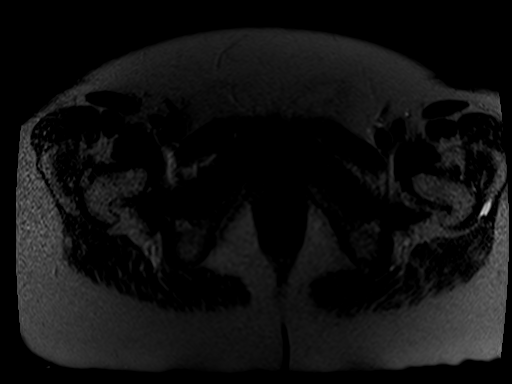
[im 28/56]
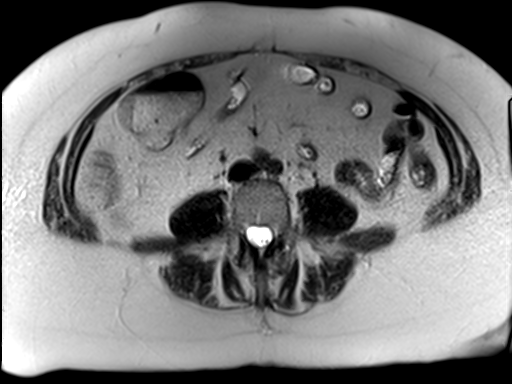
[im 56/56]
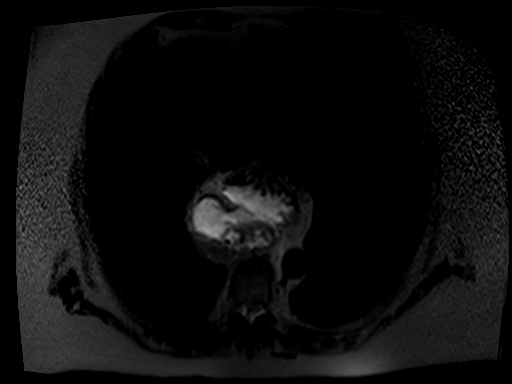

[Series 5: cor haste · coronal · 6.0mm · 0.78mm/px · 1 of 37 slices shown]
[im 1/37]
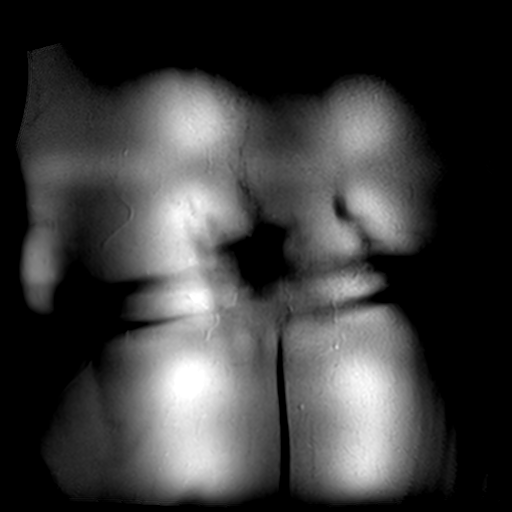

[Series 6: ep2d_diff_b50_500_800_p2_bh · axial · 5.0mm · 2.08mm/px · z∈[-154,+272]mm · 8 of 211 slices shown]
[im 1/211]
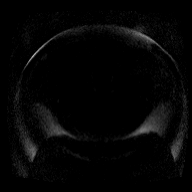
[im 31/211]
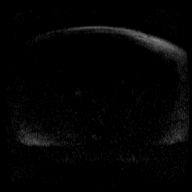
[im 61/211]
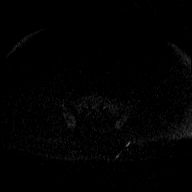
[im 91/211]
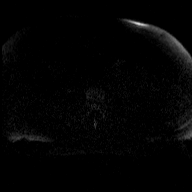
[im 121/211]
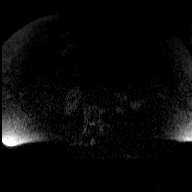
[im 151/211]
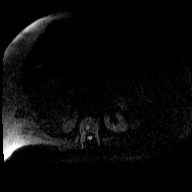
[im 181/211]
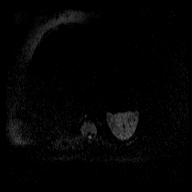
[im 211/211]
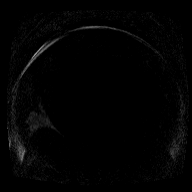

[Series 7: ep2d_[id number]_500_[id number]_bh_adc · axial · 5.0mm · 2.08mm/px · z∈[-154,+272]mm · 3 of 72 slices shown]
[im 1/72]
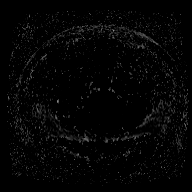
[im 36/72]
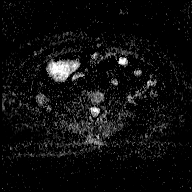
[im 72/72]
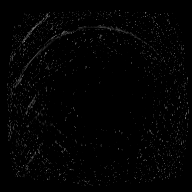

[Series 8: bSSFP · axial · 4.5mm · 0.78mm/px · z∈[-173,+263]mm · 3 of 98 slices shown]
[im 1/98]
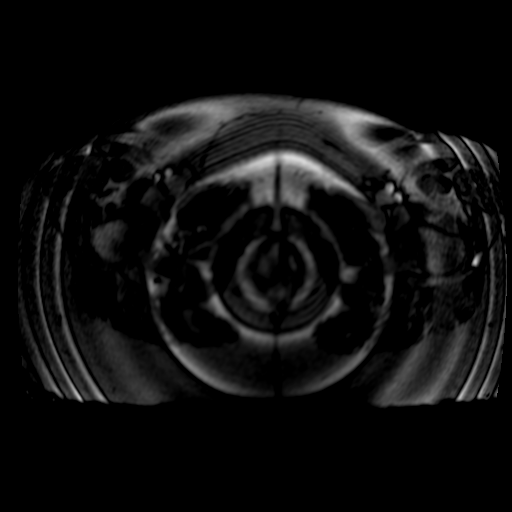
[im 49/98]
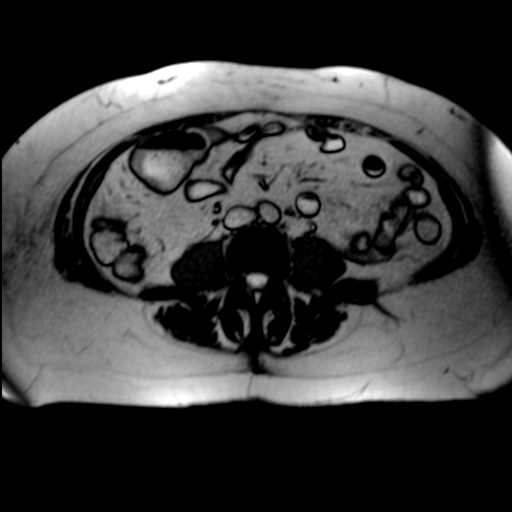
[im 98/98]
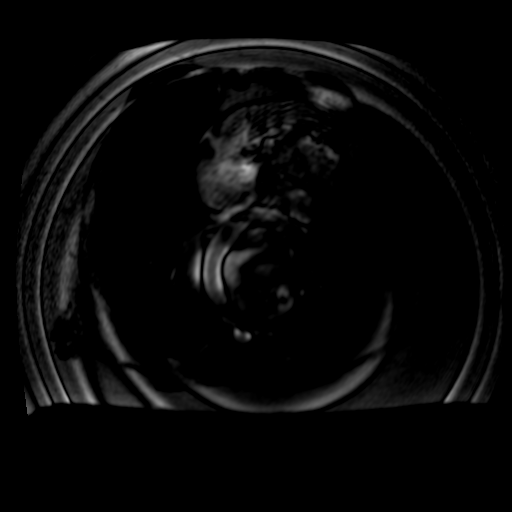

[Series 9: T1 dynamic · axial · non-contrast · 4.5mm · 0.78mm/px · z∈[-155,+236]mm · 3 of 88 slices shown]
[im 1/88]
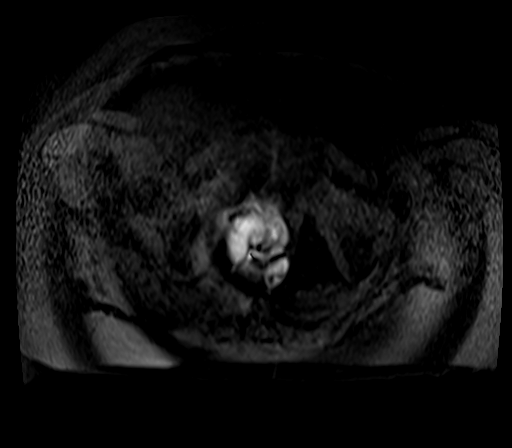
[im 44/88]
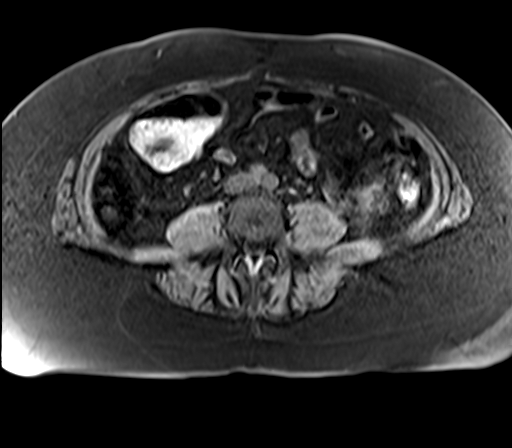
[im 88/88]
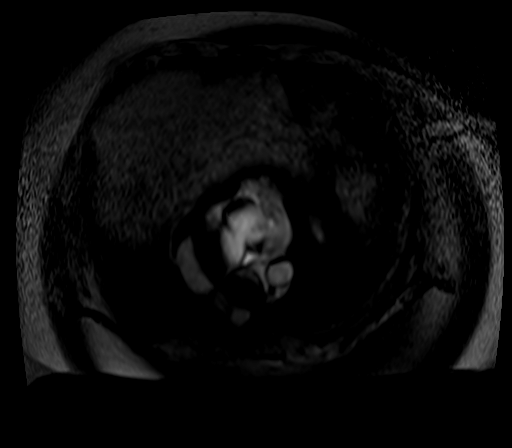

[Series 10: T1 dynamic post-contrast · axial · 4.5mm · 0.78mm/px · z∈[-155,+236]mm · 3 of 88 slices shown (1 of 3)]
[im 1/88]
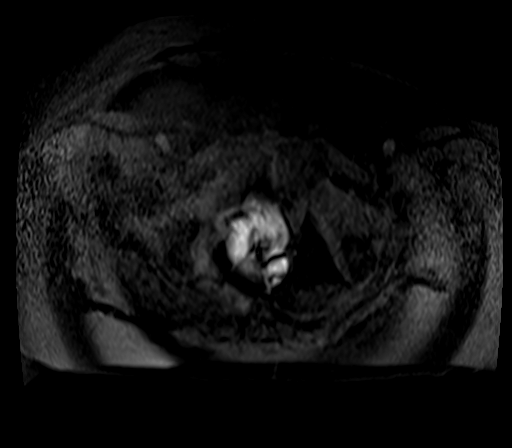
[im 44/88]
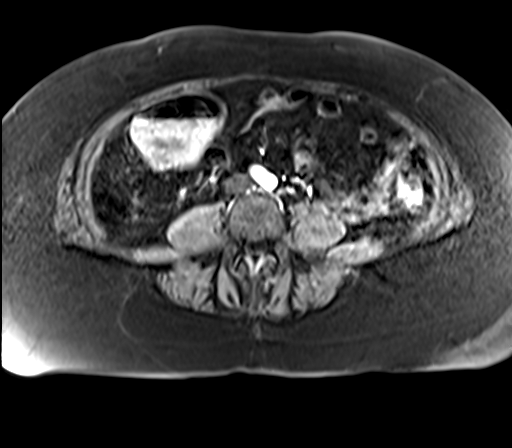
[im 88/88]
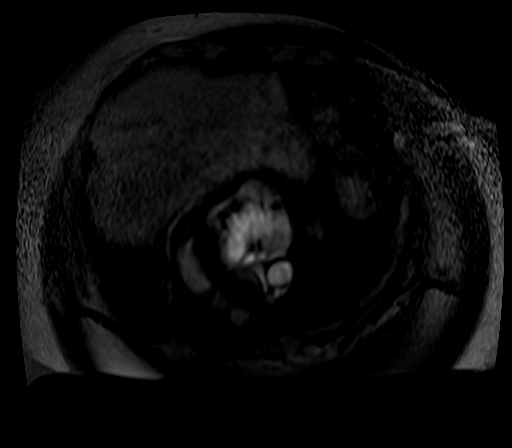

[Series 11: T1 dynamic post-contrast · axial · 4.5mm · 0.78mm/px · z∈[-155,+236]mm · 3 of 88 slices shown (2 of 3)]
[im 1/88]
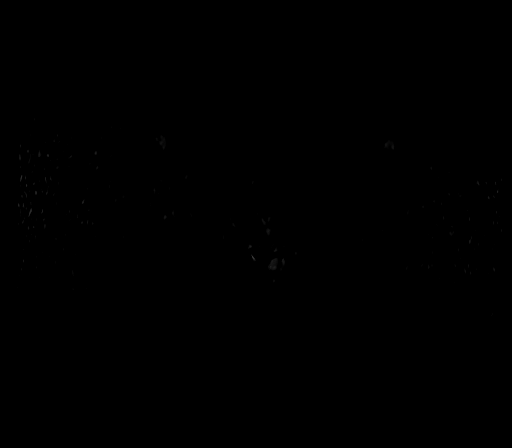
[im 44/88]
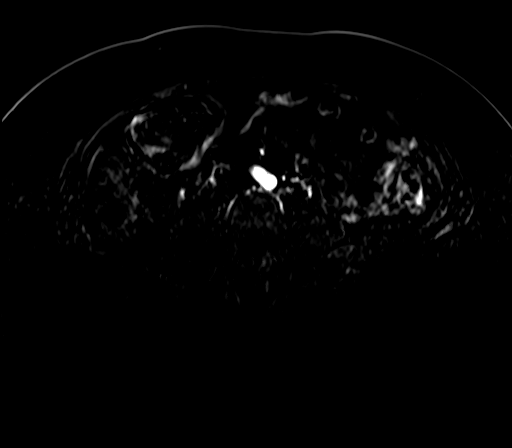
[im 88/88]
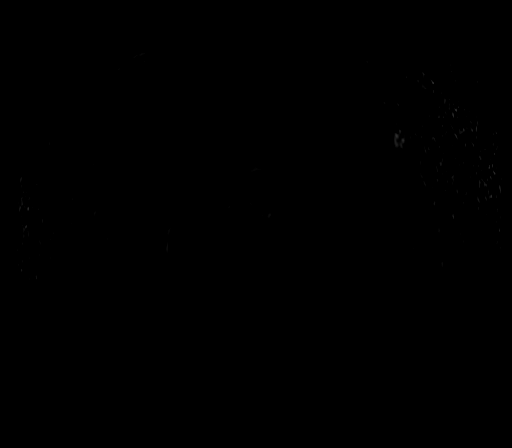

[Series 12: T1 dynamic post-contrast · axial · 4.5mm · 0.78mm/px · 1 of 88 slices shown (3 of 3)]
[im 1/88]
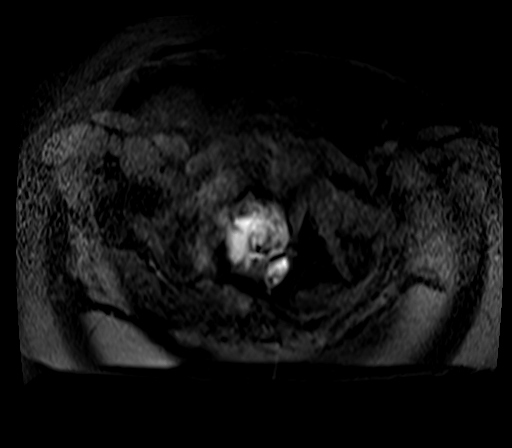

[28 of 48 positions shown; findings below may reference images not displayed]

FINDINGS: COMBINED FINDINGS FOR BOTH MR ABDOMEN AND PELVIS

Lower chest: Moderate to large hiatal hernia.  No acute abnormality.

Hepatobiliary: Stone within the gallbladder measures 1.4 cm. No
gallbladder wall inflammation. No biliary ductal dilatation.

No focal liver abnormality identified. On the postcontrast images,
the imaged portions of the liver are unremarkable.

Pancreas: No inflammation, mass, or main duct dilatation identified.

Spleen:  Within normal limits in size and appearance.

Adrenals/Urinary Tract: Normal appearance of the adrenal glands. No
kidney mass or hydronephrosis identified bilaterally. Imaged portion
of the urinary bladder is unremarkable.

Stomach/Bowel: Large hiatal hernia. There is no abnormal bowel
distention. The appendix is visualized and appears normal. No small
or large bowel thickening or inflammation identified. Terminal ileum
is identified within the right lower quadrant of the abdomen and
appears normal. Scattered distal colonic diverticula noted. No acute
inflammation.

Vascular/Lymphatic: No enlarged abdominopelvic lymph nodes. Normal
appearance of the abdominal aorta. No aneurysm. No abdominopelvic
adenopathy identified.

Reproductive: Uterus and bilateral adnexa are unremarkable.

Other:  No free fluid or fluid collection.

Musculoskeletal: No suspicious bone lesions identified.
IMPRESSION: 1. No acute findings. No evidence for active inflammatory bowel
disease.
2. Large hiatal hernia.
3. Gallstone.

ADDENDUM:
Outside CT dated 05/11/2018 was submitted for comparison to this
exam. On the prior study there was a calcification associated with
the wall of a central lower abdominal small bowel loops. On today's
exam there is a focal area decreased T2 signal and increased T1
signal in the approximate location of the previously noted
calcification measuring 1.2 cm., image 48/4 and image 73/10.
Clinical significance is indeterminate. The increased T1 signal
suggests underlying blood products. No definite corresponding soft
tissue mass noted.

1.

Focal indeterminate area of decreased T2 signal and increased is
noted within the lower abdomen/pelvis corresponding to the area of
calcification noted on previous exam. This may represent the
calcification seen on previous CT of the abdomen pelvis. This does
not appear to be changed in size. However, because calcifications
will show up as signal void artifact similar to bowel gas, CT
enterography would be a better modality for following up
calcifications associated with the bowel.

*** End of Addendum ***
FINDINGS: COMBINED FINDINGS FOR BOTH MR ABDOMEN AND PELVIS

Lower chest: Moderate to large hiatal hernia.  No acute abnormality.

Hepatobiliary: Stone within the gallbladder measures 1.4 cm. No
gallbladder wall inflammation. No biliary ductal dilatation.

No focal liver abnormality identified. On the postcontrast images,
the imaged portions of the liver are unremarkable.

Pancreas: No inflammation, mass, or main duct dilatation identified.

Spleen:  Within normal limits in size and appearance.

Adrenals/Urinary Tract: Normal appearance of the adrenal glands. No
kidney mass or hydronephrosis identified bilaterally. Imaged portion
of the urinary bladder is unremarkable.

Stomach/Bowel: Large hiatal hernia. There is no abnormal bowel
distention. The appendix is visualized and appears normal. No small
or large bowel thickening or inflammation identified. Terminal ileum
is identified within the right lower quadrant of the abdomen and
appears normal. Scattered distal colonic diverticula noted. No acute
inflammation.

Vascular/Lymphatic: No enlarged abdominopelvic lymph nodes. Normal
appearance of the abdominal aorta. No aneurysm. No abdominopelvic
adenopathy identified.

Reproductive: Uterus and bilateral adnexa are unremarkable.

Other:  No free fluid or fluid collection.

Musculoskeletal: No suspicious bone lesions identified.
IMPRESSION: 1. No acute findings. No evidence for active inflammatory bowel
disease.
2. Large hiatal hernia.
3. Gallstone.

## 2020-01-19 ENCOUNTER — Encounter: Payer: Self-pay | Admitting: Internal Medicine

## 2020-01-31 ENCOUNTER — Telehealth: Payer: Self-pay | Admitting: Family Medicine

## 2020-01-31 NOTE — Telephone Encounter (Signed)
Please contact pt to have her set up appt. Thank you

## 2020-01-31 NOTE — Telephone Encounter (Signed)
Ntbs. Thx. Dr. Lovena Le

## 2020-01-31 NOTE — Telephone Encounter (Signed)
Rhonda Harris requesting refill on Sumatriptan 50 mg tablet. Take one tablet po at first sign of headache. Pt last seen 06/15/19 for migraine. Please advise. Thank you

## 2020-02-03 NOTE — Telephone Encounter (Signed)
Schedule medication follow up  10/1.

## 2020-02-11 ENCOUNTER — Ambulatory Visit (INDEPENDENT_AMBULATORY_CARE_PROVIDER_SITE_OTHER): Payer: No Typology Code available for payment source | Admitting: Nurse Practitioner

## 2020-02-11 ENCOUNTER — Encounter: Payer: Self-pay | Admitting: Nurse Practitioner

## 2020-02-11 ENCOUNTER — Other Ambulatory Visit: Payer: Self-pay

## 2020-02-11 VITALS — BP 122/68 | HR 78 | Temp 97.5°F | Wt 235.0 lb

## 2020-02-11 DIAGNOSIS — R739 Hyperglycemia, unspecified: Secondary | ICD-10-CM

## 2020-02-11 DIAGNOSIS — Z79899 Other long term (current) drug therapy: Secondary | ICD-10-CM

## 2020-02-11 DIAGNOSIS — Z1329 Encounter for screening for other suspected endocrine disorder: Secondary | ICD-10-CM

## 2020-02-11 DIAGNOSIS — G43009 Migraine without aura, not intractable, without status migrainosus: Secondary | ICD-10-CM | POA: Diagnosis not present

## 2020-02-11 NOTE — Progress Notes (Signed)
° °  Subjective:    Patient ID: Rhonda Harris, female    DOB: May 09, 1961, 59 y.o.   MRN: [id number]  HPI Pt here for medication follow up. Pt doing well on all meds.  Taking Imitrex and Ibuprofen for occasional migraine. Number varies, episodic. Max 6 Imitrex per month. Works great for her migraines. Relieved within 30 minutes. No change in migraine symptomatology. Has not identified any specific triggers but seems to increase with heat in the summer.   Review of Systems     Objective:   Physical Exam NAD.  Alert, oriented.  Cheerful affect.  Lungs clear.  Heart regular rate rhythm.       Assessment & Plan:   Problem List Items Addressed This Visit      Other   Hyperglycemia   Relevant Orders   Comprehensive Metabolic Panel (CMET)   Hemoglobin A1c   Lipid Profile   TSH    Other Visit Diagnoses    Migraine without aura and without status migrainosus, not intractable    -  Primary   Thyroid disorder screening       Relevant Orders   Comprehensive Metabolic Panel (CMET)   Hemoglobin A1c   Lipid Profile   TSH   High risk medication use       Relevant Orders   Comprehensive Metabolic Panel (CMET)   Hemoglobin A1c   Lipid Profile   TSH     Meds ordered this encounter  Medications   SUMAtriptan (IMITREX) 50 MG tablet    Sig: TAKE ONE TABLET BY MOUTH AT FIRST SIGN OF HEADACHE    Dispense:  12 tablet    Refill:  5    Order Specific Question:   Supervising Provider    Answer:   Lilyan Punt A [[id number]]   Recommend wellness exam, labs ordered. Discussed limiting amount of Imitrex to avoid rebound headaches.  Patient is using sparingly at this point and medication is working well.  Advised patient to look for other potential triggers for her migraines. Discussed importance of healthy diet and weight loss. Return in about 6 months (around 08/11/2020) for migraine recheck.  Defers Covid and flu vaccines.

## 2020-02-12 ENCOUNTER — Encounter: Payer: Self-pay | Admitting: Nurse Practitioner

## 2020-02-12 MED ORDER — SUMATRIPTAN SUCCINATE 50 MG PO TABS: ORAL_TABLET | ORAL | 5 refills | Status: DC

## 2020-03-24 ENCOUNTER — Ambulatory Visit (INDEPENDENT_AMBULATORY_CARE_PROVIDER_SITE_OTHER): Payer: No Typology Code available for payment source | Admitting: Nurse Practitioner

## 2020-03-24 ENCOUNTER — Encounter: Payer: Self-pay | Admitting: Nurse Practitioner

## 2020-03-24 VITALS — BP 122/82 | HR 81 | Temp 98.0°F | Ht 65.0 in | Wt 236.6 lb

## 2020-03-24 DIAGNOSIS — Z0000 Encounter for general adult medical examination without abnormal findings: Secondary | ICD-10-CM

## 2020-03-24 DIAGNOSIS — M8580 Other specified disorders of bone density and structure, unspecified site: Secondary | ICD-10-CM | POA: Diagnosis not present

## 2020-03-24 DIAGNOSIS — Z780 Asymptomatic menopausal state: Secondary | ICD-10-CM | POA: Diagnosis not present

## 2020-03-24 DIAGNOSIS — Z1231 Encounter for screening mammogram for malignant neoplasm of breast: Secondary | ICD-10-CM

## 2020-03-24 DIAGNOSIS — L578 Other skin changes due to chronic exposure to nonionizing radiation: Secondary | ICD-10-CM

## 2020-03-24 DIAGNOSIS — Z01419 Encounter for gynecological examination (general) (routine) without abnormal findings: Secondary | ICD-10-CM

## 2020-03-24 LAB — LIPID PANEL
Cholesterol: 224 mg/dL — ABNORMAL HIGH (ref <200)
HDL: 70 mg/dL (ref >=50)
LDL Cholesterol (Calc): 136 mg/dL (calc) — ABNORMAL HIGH
Non-HDL Cholesterol (Calc): 154 mg/dL (calc) — ABNORMAL HIGH (ref <130)
Total CHOL/HDL Ratio: 3.2 (calc) (ref <5.0)
Triglycerides: 83 mg/dL (ref <150)

## 2020-03-24 LAB — COMPREHENSIVE METABOLIC PANEL
AG Ratio: 1.8 (calc) (ref 1.0–2.5)
ALT: 17 U/L (ref 6–29)
AST: 20 U/L (ref 10–35)
Albumin: 3.9 g/dL (ref 3.6–5.1)
Alkaline phosphatase (APISO): 69 U/L (ref 37–153)
BUN: 13 mg/dL (ref 7–25)
CO2: 29 mmol/L (ref 20–32)
Calcium: 9.5 mg/dL (ref 8.6–10.4)
Chloride: 106 mmol/L (ref 98–110)
Creat: 0.7 mg/dL (ref 0.50–1.05)
Globulin: 2.2 g/dL (calc) (ref 1.9–3.7)
Glucose, Bld: 90 mg/dL (ref 65–99)
Potassium: 4.8 mmol/L (ref 3.5–5.3)
Sodium: 141 mmol/L (ref 135–146)
Total Bilirubin: 0.5 mg/dL (ref 0.2–1.2)
Total Protein: 6.1 g/dL (ref 6.1–8.1)

## 2020-03-24 LAB — HEMOGLOBIN A1C
Hgb A1c MFr Bld: 5.8 % of total Hgb — ABNORMAL HIGH (ref <5.7)
Mean Plasma Glucose: 120 (calc)
eAG (mmol/L): 6.6 (calc)

## 2020-03-24 LAB — TSH: TSH: 0.99 mIU/L (ref 0.40–4.50)

## 2020-03-24 NOTE — Progress Notes (Signed)
Subjective:    Subjective   Patient ID: Rhonda Harris, female    DOB: 1960-11-22, 59 y.o.   MRN: [id number]  HPI The patient comes in today for a wellness visit with no other concerns. PMH significant for osteopenia,migraines, and microcytic anemia, patient is followed by GI for regular lab work. Colonoscopy 2019 normal. She reports migraines are well managed with current Imitrex dosage. She denies frequent use of Imitrex and states migraine frequency occurs primary in the summer months due to heat exposure. She is a Tour manager and married. No recent changes in sexual partner. Patient reports routine eye and dental examihe declines Flu and COVID vaccines this visit. Office to schedule mammogram as patient has had difficulty securing an appointment. Patient is postmenopausal.  Pap 2019 and normal. Dexa scan 2019. Patient reports no particular diet and she exercises by walking 2x/week. She denies ETOH, tobacco, vape, or recreational drug use. She reports adequate rest.   A review of their health history was completed.  A review of medications was also completed.  Any needed refills; none  Eating habits: health conscious  Falls/  MVA accidents in past few months: none  Regular exercise: walks  Specialist pt sees on regular basis: none  Preventative health issues were discussed.   Additional concerns: none    Review of Systems Review of Systems  Constitutional: Negative for chills, fever, malaise/fatigue and weight loss.  HENT: Negative for congestion and sore throat.   Eyes: Negative for blurred vision.  Respiratory: Negative for cough, shortness of breath and wheezing.   Cardiovascular: Negative for chest pain, palpitations and leg swelling.  Gastrointestinal: Positive for abdominal pain. Negative for blood in stool, constipation, diarrhea, heartburn, nausea and vomiting.       Gas with green leafy vegetables  Genitourinary: Negative for dysuria, frequency  and urgency.  Skin: Negative for rash.  Neurological: Headaches: Ocassional migraines.  Psychiatric/Behavioral: Negative for depression, memory loss, substance abuse and suicidal ideas. The patient is not nervous/anxious and does not have insomnia.   No vaginal discharge or pelvic pain. No vaginal bleeding.     Objective:   Objective  Today's Vitals   03/24/20 1117  BP: 122/82  Pulse: 81  Temp: 98 F (36.7 C)  TempSrc: Oral  SpO2: 98%  Weight: 236 lb 9.6 oz (107.3 kg)  Height: 5' 5"  (1.651 m)   Body mass index is 39.37 kg/m. Physical Exam Exam conducted with a chaperone present.  Constitutional:      General: She is not in acute distress.    Appearance: Normal appearance. She is not ill-appearing.  Neck:     Thyroid: No thyroid mass, thyromegaly or thyroid tenderness.  Cardiovascular:     Rate and Rhythm: Normal rate and regular rhythm.     Heart sounds: Normal heart sounds. No murmur heard.  No gallop.   Pulmonary:     Effort: No respiratory distress.     Breath sounds: Normal breath sounds. No wheezing or rhonchi.  Chest:     Chest wall: No mass.     Breasts: Breasts are symmetrical.        Right: No swelling, mass, skin change or tenderness.        Left: Normal. No swelling, mass, skin change or tenderness.  Abdominal:     General: There is no distension.     Palpations: Abdomen is soft. There is no mass.     Tenderness: There is no abdominal tenderness. There is no guarding.  Genitourinary:    Exam position: Lithotomy position.     Labia:        Right: No rash, tenderness or lesion.        Left: No rash, tenderness or lesion.      Urethra: No urethral swelling or urethral lesion.     Vagina: No vaginal discharge, erythema, tenderness, bleeding or lesions.     Cervix: No cervical motion tenderness or erythema.     Uterus: Not enlarged and not tender.      Rectum: Normal.     Comments: External: Vagina pale pink Internal: No masses or tenderness during  bimanual exam cervical os pale pink no palpable ovaries Musculoskeletal:        General: Normal range of motion.     Cervical back: Normal range of motion.     Right lower leg: No edema.     Left lower leg: No edema.  Lymphadenopathy:     Cervical: No cervical adenopathy.     Upper Body:     Right upper body: No supraclavicular, axillary or pectoral adenopathy.     Left upper body: No supraclavicular, axillary or pectoral adenopathy.  Skin:    General: Skin is warm and dry.     Comments: Various areas of sun damage  Neurological:     Mental Status: She is alert and oriented to person, place, and time.     Motor: No weakness.  Psychiatric:        Mood and Affect: Mood normal.        Behavior: Behavior normal.        Thought Content: Thought content normal.        Judgment: Judgment normal.    Recent Results (from the past 2160 hour(s))  Comprehensive Metabolic Panel (CMET)     Status: None   Collection Time: 03/23/20 10:11 AM  Result Value Ref Range   Glucose, Bld 90 65 - 99 mg/dL    Comment: .            Fasting reference interval .    BUN 13 7 - 25 mg/dL   Creat 0.70 0.50 - 1.05 mg/dL    Comment: For patients >47 years of age, the reference limit for Creatinine is approximately 13% higher for people identified as African-American. .    BUN/Creatinine Ratio NOT APPLICABLE 6 - 22 (calc)   Sodium 141 135 - 146 mmol/L   Potassium 4.8 3.5 - 5.3 mmol/L   Chloride 106 98 - 110 mmol/L   CO2 29 20 - 32 mmol/L   Calcium 9.5 8.6 - 10.4 mg/dL   Total Protein 6.1 6.1 - 8.1 g/dL   Albumin 3.9 3.6 - 5.1 g/dL   Globulin 2.2 1.9 - 3.7 g/dL (calc)   AG Ratio 1.8 1.0 - 2.5 (calc)   Total Bilirubin 0.5 0.2 - 1.2 mg/dL   Alkaline phosphatase (APISO) 69 37 - 153 U/L   AST 20 10 - 35 U/L   ALT 17 6 - 29 U/L  Hemoglobin A1c     Status: Abnormal   Collection Time: 03/23/20 10:11 AM  Result Value Ref Range   Hgb A1c MFr Bld 5.8 (H) <5.7 % of total Hgb    Comment: For someone without  known diabetes, a hemoglobin  A1c value between 5.7% and 6.4% is consistent with prediabetes and should be confirmed with a  follow-up test. . For someone with known diabetes, a value <7% indicates that their diabetes is well controlled. A1c  targets should be individualized based on duration of diabetes, age, comorbid conditions, and other considerations. . This assay result is consistent with an increased risk of diabetes. . Currently, no consensus exists regarding use of hemoglobin A1c for diagnosis of diabetes for children. .    Mean Plasma Glucose 120 (calc)   eAG (mmol/L) 6.6 (calc)  Lipid Profile     Status: Abnormal   Collection Time: 03/23/20 10:11 AM  Result Value Ref Range   Cholesterol 224 (H) <200 mg/dL   HDL 70 > OR = 50 mg/dL   Triglycerides 83 <150 mg/dL   LDL Cholesterol (Calc) 136 (H) mg/dL (calc)    Comment: Reference range: <100 . Desirable range <100 mg/dL for primary prevention;   <70 mg/dL for patients with CHD or diabetic patients  with > or = 2 CHD risk factors. Marland Kitchen LDL-C is now calculated using the Martin-Hopkins  calculation, which is a validated novel method providing  better accuracy than the Friedewald equation in the  estimation of LDL-C.  Cresenciano Genre et al. Annamaria Helling. 0051;102(11): 2061-2068  (http://education.[facility].com/faq/FAQ164)    Total CHOL/HDL Ratio 3.2 <5.0 (calc)   Non-HDL Cholesterol (Calc) 154 (H) <130 mg/dL (calc)    Comment: For patients with diabetes plus 1 major ASCVD risk  factor, treating to a non-HDL-C goal of <100 mg/dL  (LDL-C of <70 mg/dL) is considered a therapeutic  option.   TSH     Status: None   Collection Time: 03/23/20 10:11 AM  Result Value Ref Range   TSH 0.99 0.40 - 4.50 mIU/L   Labs reviewed with patient      Assessment & Plan:   Problem List Items Addressed This Visit      Musculoskeletal and Integument   Sun-damaged skin   Osteopenia   Relevant Orders   DG Bone Density    Other Visit  Diagnoses    Well woman exam    -  Primary   Encounter for screening mammogram for breast cancer       Relevant Orders   MM 3D SCREEN BREAST BILATERAL   Post-menopausal       Relevant Orders   DG Bone Density      Recommended patient to consider Skin Cancer Screening with dermatology. Mammogram and Bone Density screening scheduled this visit. Replens and [place] Jelly for vaginal dryness. Discussed importance of healthy diet and regular activity. Discussed importance of weight loss to help reduce A1C and LDL levels.   Return in about 1 year (around 03/24/2021) for physical.

## 2020-03-24 NOTE — Progress Notes (Signed)
   Subjective:    Patient ID: Rhonda Harris, female    DOB: 1961-01-24, 59 y.o.   MRN: [id number]  HPI Patient arrives for a check up and to discuss recent labs   Review of Systems     Objective:   Physical Exam        Assessment & Plan:

## 2020-03-24 NOTE — Patient Instructions (Signed)
Skin Cancer Screening with dermatology Mammogram Bone Density screening Replens and [place] Jelly for dryness

## 2020-03-24 NOTE — Progress Notes (Signed)
   Subjective:    Patient ID: Rhonda Harris, female    DOB: Jun 07, 1960, 59 y.o.   MRN: [id number]  HPI The patient comes in today for a wellness visit.    A review of their health history was completed.  A review of medications was also completed.  Any needed refills; none  Eating habits: health conscious  Falls/  MVA accidents in past few months: none  Regular exercise: walks  Specialist pt sees on regular basis: none  Preventative health issues were discussed.   Additional concerns: none    Review of Systems     Objective:   Physical Exam        Assessment & Plan:

## 2020-03-25 ENCOUNTER — Encounter: Payer: Self-pay | Admitting: Nurse Practitioner

## 2020-04-10 ENCOUNTER — Ambulatory Visit (HOSPITAL_COMMUNITY)
Admission: RE | Admit: 2020-04-10 | Discharge: 2020-04-10 | Disposition: A | Discharge status: Home / Self Care | Payer: No Typology Code available for payment source | Source: Ambulatory Visit | Attending: Nurse Practitioner | Admitting: Nurse Practitioner

## 2020-04-10 ENCOUNTER — Other Ambulatory Visit: Payer: Self-pay

## 2020-04-10 DIAGNOSIS — Z780 Asymptomatic menopausal state: Secondary | ICD-10-CM | POA: Insufficient documentation

## 2020-04-10 DIAGNOSIS — M8580 Other specified disorders of bone density and structure, unspecified site: Secondary | ICD-10-CM | POA: Insufficient documentation

## 2020-04-10 DIAGNOSIS — Z1231 Encounter for screening mammogram for malignant neoplasm of breast: Secondary | ICD-10-CM | POA: Insufficient documentation

## 2020-06-03 ENCOUNTER — Ambulatory Visit
Admission: RE | Admit: 2020-06-03 | Discharge: 2020-06-03 | Disposition: A | Discharge status: Home / Self Care | Payer: No Typology Code available for payment source | Source: Ambulatory Visit | Attending: Family Medicine | Admitting: Family Medicine

## 2020-06-03 ENCOUNTER — Other Ambulatory Visit: Payer: Self-pay

## 2020-06-03 VITALS — BP 136/94 | HR 78 | Temp 97.4°F | Resp 18 | Ht 65.0 in | Wt 230.0 lb

## 2020-06-03 DIAGNOSIS — Z20822 Contact with and (suspected) exposure to covid-19: Secondary | ICD-10-CM | POA: Diagnosis not present

## 2020-06-03 DIAGNOSIS — J069 Acute upper respiratory infection, unspecified: Secondary | ICD-10-CM

## 2020-06-03 DIAGNOSIS — R0981 Nasal congestion: Secondary | ICD-10-CM | POA: Diagnosis not present

## 2020-06-03 DIAGNOSIS — J029 Acute pharyngitis, unspecified: Secondary | ICD-10-CM | POA: Diagnosis not present

## 2020-06-03 MED ORDER — PROMETHAZINE-DM 6.25-15 MG/5ML PO SYRP: 5.0000 mL | ORAL_SOLUTION | Freq: Four times a day (QID) | ORAL | 0 refills | Status: DC | PRN

## 2020-06-03 NOTE — Discharge Instructions (Signed)
I have sent in cough syrup for you to take. This medication can make you sleepy. Do not drive while taking this medication.  Continue steroids at home  Your COVID test is pending.  You should self quarantine until the test result is back.    Take Tylenol or ibuprofen as needed for fever or discomfort.  Rest and keep yourself hydrated.    Follow-up with your primary care provider if your symptoms are not improving.

## 2020-06-03 NOTE — ED Triage Notes (Signed)
Sore throat and cough x 2 weeks

## 2020-06-03 NOTE — ED Provider Notes (Signed)
[facility]   [id number] 06/03/20 Arrival Time: 1332   CC: COVID symptoms  SUBJECTIVE: History from: patient.  Rhonda Harris is a 60 y.o. female who presents with sore throat and cough x 2 weeks. Reports that she is currently taking prednisone for a musculoskeletal issue. Denies sick exposure to COVID, flu or strep. Denies recent travel. Has negative history of Covid. Has not completed Covid vaccines. Had negative home Covid test and it was negative x 2 days ago. Has taken OTC cough and cold with little relief. There are no aggravating or alleviating factors. Denies previous symptoms in the past. Denies fever, chills, fatigue, sinus pain, rhinorrhea, SOB, wheezing, chest pain, nausea, changes in bowel or bladder habits.    ROS: As per HPI.  All other pertinent ROS negative.     Past Medical History:  Diagnosis Date  . Celiac disease March 2015  . Esophageal erosions   . GERD (gastroesophageal reflux disease)    Past Surgical History:  Procedure Laterality Date  . BIOPSY  02/04/2018   Procedure: BIOPSY;  Surgeon: Corbin Ade, MD;  Location: [facility];  Service: Endoscopy;;  duodenal bx's  . BIOPSY  02/25/2018   Procedure: BIOPSY;  Surgeon: Corbin Ade, MD;  Location: [facility];  Service: Endoscopy;;  ileum  . COLONOSCOPY N/A 02/25/2018   Dr. Jena Gauss: Diverticulosis, 5 cm of the terminal ileum appeared eroded and slightly cobblestoned.  Ileal biopsy with prominent lymphoid aggregates, negative for dysplasia.  . COLONOSCOPY WITH ESOPHAGOGASTRODUODENOSCOPY (EGD) N/A 07/12/2013   Dr. Jena Gauss: EGD revealed erosive reflux esophagitis, large hiatal hernia, antral erosions, abnormal duodenum with path revealing partially developed celiac sprue. Celiac serologies normal but felt to be dealing with celiac disease. Colonoscopy with ileal erosions, negative path.   . ESOPHAGOGASTRODUODENOSCOPY N/A 02/04/2018   Dr. Jena Gauss: Nonobstructing Schatzki ring status post dilation.   Large hiatal hernia.  Scattered mild mucosal changes in the second portion of the duodenum, slightly frondy but no scalloping.  Small bowel biopsy with mild hyperemia and edema, no features of sprue, active inflammation or granulomas.  Of note, patient was on a gluten-free diet for 4 years prior to this biopsy.  Marland Kitchen GIVENS CAPSULE STUDY N/A 03/23/2018   Few erosions/?  Ulcerations noted.  Abnormal villi towards distal small intestines of uncertain significance.  Prominent fold versus benign-appearing polyp with erosion noted at 2 hours 37 minutes 24 seconds.  Marland Kitchen MALONEY DILATION N/A 02/04/2018   Procedure: Elease Hashimoto DILATION;  Surgeon: Corbin Ade, MD;  Location: [facility];  Service: Endoscopy;  Laterality: N/A;  . TUBAL LIGATION     No Known Allergies No current facility-administered medications on file prior to encounter.   Current Outpatient Medications on File Prior to Encounter  Medication Sig Dispense Refill  . Calcium Carb-Cholecalciferol (CALCIUM + VITAMIN D3 PO) Take 1 tablet by mouth daily.    . ferrous sulfate 325 (65 FE) MG tablet Take 325 mg by mouth daily with breakfast.    . Multiple Vitamin (MULTIVITAMIN) tablet Take 2 tablets by mouth daily.     . pantoprazole (PROTONIX) 40 MG tablet Take 40 mg once to twice daily before a meal 60 tablet 5  . SUMAtriptan (IMITREX) 50 MG tablet TAKE ONE TABLET BY MOUTH AT FIRST SIGN OF HEADACHE 12 tablet 5   Social History   Socioeconomic History  . Marital status: Married    Spouse name: Not on file  . Number of children: Not on file  .  Years of education: Not on file  . Highest education level: Not on file  Occupational History  . Not on file  Tobacco Use  . Smoking status: Former Smoker    Quit date: 07/02/1993    Years since quitting: 26.9  . Smokeless tobacco: Never Used  . Tobacco comment: only smoked about 2 years as teenager  Vaping Use  . Vaping Use: Never used  Substance and Sexual Activity  . Alcohol use: Yes     Comment: rare social   . Drug use: No  . Sexual activity: Yes    Birth control/protection: Post-menopausal, Surgical  Other Topics Concern  . Not on file  Social History Narrative  . Not on file   Social Determinants of Health   Financial Resource Strain: Not on file  Food Insecurity: Not on file  Transportation Needs: Not on file  Physical Activity: Not on file  Stress: Not on file  Social Connections: Not on file  Intimate Partner Violence: Not on file   Family History  Problem Relation Age of Onset  . Osteoporosis Mother   . Cancer Father 65       pancreatic  . Colon cancer Neg Hx   . Gastric cancer Neg Hx   . Cancer - Other Neg Hx        small bowel    OBJECTIVE:  Vitals:   06/03/20 1409 06/03/20 1411  BP:  (!) 136/94  Pulse:  78  Resp:  18  Temp:  (!) 97.4 F (36.3 C)  TempSrc:  Oral  SpO2:  95%  Weight: 230 lb (104.3 kg)   Height: 5\' 5"  (1.651 m)      General appearance: alert; appears fatigued, but nontoxic; speaking in full sentences and tolerating own secretions HEENT: NCAT; Ears: EACs clear, TMs pearly gray; Eyes: PERRL.  EOM grossly intact. Sinuses: nontender; Nose: nares patent with clear rhinorrhea, Throat: oropharynx erythematous, cobblestoning present, tonsils non erythematous or enlarged, uvula midline  Neck: supple without LAD Lungs: unlabored respirations, symmetrical air entry; cough: mild; no respiratory distress; CTAB Heart: regular rate and rhythm.  Radial pulses 2+ symmetrical bilaterally Skin: warm and dry Psychological: alert and cooperative; normal mood and affect  LABS:  No results found for this or any previous visit (from the past 24 hour(s)).   ASSESSMENT & PLAN:  1. Viral URI with cough   2. Exposure to COVID-19 virus   3. Sore throat   4. Nasal congestion     Meds ordered this encounter  Medications  . promethazine-dextromethorphan (PROMETHAZINE-DM) 6.25-15 MG/5ML syrup    Sig: Take 5 mLs by mouth 4 (four) times daily  as needed for cough.    Dispense:  118 mL    Refill:  0    Order Specific Question:   Supervising Provider    Answer:   07-15-1989 Merrilee Jansky   Promethazine cough syrup prescribed Sedation precautions given Continue supportive care at home COVID and flu testing ordered.  It will take between 2-3 days for test results. Someone will contact you regarding abnormal results.   Work note provided Patient should remain in quarantine until they have received Covid results.  If negative you may resume normal activities (go back to work/school) while practicing hand hygiene, social distance, and mask wearing.  If positive, patient should remain in quarantine for at least 5 days from symptom onset AND greater than 72 hours after symptoms resolution (absence of fever without the use of fever-reducing medication and improvement in  respiratory symptoms), whichever is longer Get plenty of rest and push fluids Use OTC zyrtec for nasal congestion, runny nose, and/or sore throat Use OTC flonase for nasal congestion and runny nose Use medications daily for symptom relief Use OTC medications like ibuprofen or tylenol as needed fever or pain Call or go to the ED if you have any new or worsening symptoms such as fever, worsening cough, shortness of breath, chest tightness, chest pain, turning blue, changes in mental status.  Reviewed expectations re: course of current medical issues. Questions answered. Outlined signs and symptoms indicating need for more acute intervention. Patient verbalized understanding. After Visit Summary given.         Moshe Cipro, NP 06/03/20 1447

## 2020-06-04 LAB — COVID-19, FLU A+B NAA
Influenza A, NAA: NOT DETECTED
Influenza B, NAA: NOT DETECTED
SARS-CoV-2, NAA: NOT DETECTED

## 2020-07-12 ENCOUNTER — Other Ambulatory Visit: Payer: Self-pay | Admitting: Nurse Practitioner

## 2020-07-12 ENCOUNTER — Other Ambulatory Visit: Payer: Self-pay | Admitting: Gastroenterology

## 2020-07-12 NOTE — Telephone Encounter (Signed)
Needs appt to establish care, dr. Lovena Le Will give small amt.  Have appt next 30 days.

## 2020-07-12 NOTE — Telephone Encounter (Signed)
Patient has not been seen since September 2020.  I am refilling her Protonix.  Recommend office visit for additional refills.

## 2020-07-13 NOTE — Telephone Encounter (Signed)
Please contact patient and have set up appt to establish care in next 30 days. Thank you

## 2020-07-13 NOTE — Telephone Encounter (Signed)
Sent mychart message to schedule.

## 2020-07-13 NOTE — Telephone Encounter (Signed)
Noted. Tried calling pt. VM is full. Will mail letter.

## 2020-08-23 ENCOUNTER — Ambulatory Visit (INDEPENDENT_AMBULATORY_CARE_PROVIDER_SITE_OTHER): Payer: No Typology Code available for payment source | Admitting: Gastroenterology

## 2020-08-23 ENCOUNTER — Other Ambulatory Visit: Payer: Self-pay

## 2020-08-23 ENCOUNTER — Encounter: Payer: Self-pay | Admitting: Gastroenterology

## 2020-08-23 VITALS — BP 137/88 | HR 71 | Temp 97.7°F | Ht 65.0 in | Wt 235.6 lb

## 2020-08-23 DIAGNOSIS — K2100 Gastro-esophageal reflux disease with esophagitis, without bleeding: Secondary | ICD-10-CM | POA: Diagnosis not present

## 2020-08-23 DIAGNOSIS — D509 Iron deficiency anemia, unspecified: Secondary | ICD-10-CM | POA: Diagnosis not present

## 2020-08-23 DIAGNOSIS — R933 Abnormal findings on diagnostic imaging of other parts of digestive tract: Secondary | ICD-10-CM

## 2020-08-23 DIAGNOSIS — K900 Celiac disease: Secondary | ICD-10-CM | POA: Diagnosis not present

## 2020-08-23 DIAGNOSIS — R1013 Epigastric pain: Secondary | ICD-10-CM

## 2020-08-23 DIAGNOSIS — K449 Diaphragmatic hernia without obstruction or gangrene: Secondary | ICD-10-CM

## 2020-08-23 MED ORDER — PANTOPRAZOLE SODIUM 40 MG PO TBEC: DELAYED_RELEASE_TABLET | ORAL | 11 refills | Status: DC

## 2020-08-23 NOTE — Progress Notes (Signed)
Cc'ed to pcp °

## 2020-08-23 NOTE — Patient Instructions (Signed)
1. We will request copy of labs from your Rheumatologist for review.  If additional labs still needed, we will let you know. 2. Continue gluten-free diet. 3. Continue pantoprazole 40 mg 1-2 times daily as needed for reflux. 4. For upper abdominal pain occurring after meals, try taking Pepcid Complete per package instructions.  5. Call if Pepcid Complete does not help your abdominal pain.  6. Consider another CT of the small bowel to follow up on the small lesion seen previously. Please let me know what you decide.  7. Return to the office in one year or call sooner if needed.

## 2020-08-23 NOTE — Progress Notes (Signed)
Primary Care Physician: Erven Colla, DO  Primary Gastroenterologist:  Garfield Cornea, MD   Chief Complaint  Patient presents with  . Gastroesophageal Reflux    Doing fine. Takes pantoprazole QD but occas will take BID if needed    HPI: Rhonda Harris is a 60 y.o. female here for follow-up.  Patient was last seen in September 2020.  She has a history of large hiatal hernia, dysphagia, IDA, celiac disease (based on pathology March 2015 although her serologies were negative at that time, presented with profound IDA with hemoglobin of 5.4 and MCV of 58).  See GI work-up alone.  Patient presents today stating that overall she has been doing well.  She has had some issues with arthritis in her hands.  Has been receiving injections by orthopedist, Dr. Layne Benton.  She has seen Dr. Gavin Pound, [facility] rheumatology.  Patient states she has been diagnosed with rheumatoid arthritis.  Has been on anti-inflammatory, nabumetone, new medication prescribed yesterday which she has not started yet, hydroxychloroquine.  States she typically takes her pantoprazole every morning.  Only needs additional pill if eats something that triggers her reflux.  She has had some intermittent epigastric pain after meals, radiates into her left arm.  PCP suggested that she may be having gas issues related to certain foods.  Patient has noted it with salads, recently with baked potato with butter. Sometimes will go weeks at a time with no symptoms. Some weeks has it several times.  Pain does not seem to be associated with bowel habits.  Typically will try to belch to get relief.  May or may not help.  Tries to "walk it off" and often takes a second PPI.  Symptoms last 10 to 30 minutes.  Sometimes can be significant.  BM good. No melena, brbpr. No n/v.  States she has been sticking to gluten-free diet.   Patient is noted to have a moderate to large hiatal hernia, gallstone measuring 1.4 cm both seen previously on  MRE.  Prior GI work up:  EGD/colonoscopy March 2015 revealed erosive reflux esophagitis, large hiatal hernia, antral erosions, abnormal duodenum with path revealing partially developed celiac sprue.  Celiac serologies normal but felt to be dealing with celiac disease.  Colonoscopy at that time with ileal erosions, negative path.  Last EGD in September 2019 for dysphagia and abdominal pain.  Large hiatal hernia, nonobstructing Schatzki ring which was dilated.  Scattered mild mucosal changes found in the second portion duodenum, biopsies benign without features of sprue.  Of note she basically has been on gluten-free diet for 4 years at time of this EGD.  TTG was repeated and was negative.  TTG negative in 2015 as well.  Colonoscopy October 2019 showed diverticulosis, 5 cm of the terminal ileum appeared eroded and slightly cobblestoned.  Ileal biopsy with prominent lymphoid aggregates but negative for dysplasia.   Capsule study done November 2019 with scattered lymphangiectasia's, small nonbleeding few erosions/ulcers around 1 hour 5 minutes 52 seconds.  Prominent fold versus benign-appearing polyp with erosion noted at 2 hours 37 minutes 24 seconds.  Abnormal villi of uncertain significance at 3 hours 26 minutes 46 seconds.  Possible few erosions of the TI.    Abnormal capsule led to a CTE (04/2018) to evaluate fold versus polyp.  She was noted to have a small 1 cm calcification adjacent to a small bowel loop in the pelvis which could be incidental but possibility of small calcified GIST not excluded.  Currently has 2 other calcifications that have been present since 2015 on CT.  MRE abdomen pelvis July 2020 "Focal indeterminate area of decreased T2 signal and increased is noted within the lower abdomen/pelvis corresponding to the area of calcification noted on previous exam. This may represent the calcification seen on previous CT of the abdomen pelvis. This does not appear to be changed in size.  However, because calcifications will show up as signal void artifact similar to bowel gas, CT enterography would be a better modality for following up calcifications associated with the bowel".  Plans for repeat CTE in 6 months to reassess small 1 cm calcification adjacent to small bowel loop in the pelvis. Patient never completed.   Current Outpatient Medications  Medication Sig Dispense Refill  . Calcium Carb-Cholecalciferol (CALCIUM + VITAMIN D3 PO) Take 1 tablet by mouth daily.    . ferrous sulfate 325 (65 FE) MG tablet Take 325 mg by mouth daily with breakfast.    . Multiple Vitamin (MULTIVITAMIN) tablet Take 2 tablets by mouth daily.     . nabumetone (RELAFEN) 500 MG tablet Take 500 mg by mouth 2 (two) times daily.    . pantoprazole (PROTONIX) 40 MG tablet TAKE ONE TABLET (40MG TOTAL) BY MOUTH TWO TIMES DAILY BEFORE A MEAL (Patient taking differently: Take 40 mg by mouth daily. TAKE ONE TABLET (40MG TOTAL) BY MOUTH TWO TIMES DAILY BEFORE A MEAL) 60 tablet 3  . SUMAtriptan (IMITREX) 50 MG tablet TAKE ONE TABLET BY MOUTH AT FIRST SIGN OF HEADACHE. 12 tablet 0  . hydroxychloroquine (PLAQUENIL) 200 MG tablet Take 200 mg by mouth 2 (two) times daily.     No current facility-administered medications for this visit.    Allergies as of 08/23/2020  . (No Known Allergies)    ROS:  General: Negative for anorexia, weight loss, fever, chills, fatigue, weakness. ENT: Negative for hoarseness, difficulty swallowing , nasal congestion. CV: Negative for chest pain, angina, palpitations, dyspnea on exertion, peripheral edema.  Respiratory: Negative for dyspnea at rest, dyspnea on exertion, cough, sputum, wheezing.  GI: See history of present illness. GU:  Negative for dysuria, hematuria, urinary incontinence, urinary frequency, nocturnal urination.  Endo: Negative for unusual weight change.    Physical Examination:   BP 137/88   Pulse 71   Temp 97.7 F (36.5 C)   Ht 5' 5"  (1.651 m)   Wt 235 lb  9.6 oz (106.9 kg)   BMI 39.21 kg/m   General: Well-nourished, well-developed in no acute distress.  Eyes: No icterus. Mouth: masked Lungs: Clear to auscultation bilaterally.  Heart: Regular rate and rhythm, no murmurs rubs or gallops.  Abdomen: Bowel sounds are normal, nontender, nondistended, no hepatosplenomegaly or masses, no abdominal bruits or hernia , no rebound or guarding.   Extremities: No lower extremity edema. No clubbing or deformities. Neuro: Alert and oriented x 4   Skin: Warm and dry, no jaundice.   Psych: Alert and cooperative, normal mood and affect.  Labs:  Lab Results  Component Value Date   TSH 0.99 03/23/2020   Lab Results  Component Value Date   HGBA1C 5.8 (H) 03/23/2020   Lab Results  Component Value Date   WBC 5.3 05/17/2019   HGB 15.3 05/17/2019   HCT 45.6 (H) 05/17/2019   MCV 89.6 05/17/2019   PLT 210 05/17/2019   Lab Results  Component Value Date   ALT 17 03/23/2020   AST 20 03/23/2020   ALKPHOS 59 11/06/2015   BILITOT 0.5 03/23/2020  Imaging Studies: No results found.  Assessment/plan:  Celiac disease: Diagnosed based on biopsies in 2015.  Celiac serologies were negative at that time.  Repeat EGD in 2019 normal small bowel biopsies, had been on gluten-free diet for 40 years at that point.  Last TTG in January 2021 normal.  Reports maintaining gluten-free diet.  She has had a lot of labs recently with PCP and rheumatology.  Will request labs from rheumatology to determine if hemoglobin is been checked to follow-up on her anemia.  Patient will likely still need additional labs to further assess for celiac disease.  We will keep her posted.  Reflux esophagitis: For the most part is adequately controlled with pantoprazole 40 mg daily.  Takes an additional dose on occasions.  Return to the office in 1 year.  Abnormal CT scan, small bowel: 1 cm calcified lesion adjacent to small bowel loop in the pelvis initially seen on CTE for follow-up MRE  as outlined.  Was due for surveillance CTE last year but did not complete.  Discussed with patient that findings may be significant but cannot rule out other etiologies such as GIST.  Would like to think about further surveillance and will let me know if she agrees to CTE.  History of profound IDA: Presented in 2015 and 2019 as previously outlined.  Will need recheck CBC if not done by rheumatology.  Epigastric pain: Postprandial symptoms.  Epigastric region radiates up into the left shoulder.  Last 10 to 30 minutes at a time.  Sometimes walking around will help.  She has a history of large hiatal hernia which may be cause of her symptoms.  We will have her try Pepcid Complete at onset of symptoms to see if this helps.  She will keep me posted.  If not helpful, may need further evaluation.  She also does have a history of large gallstone but at this time denies right upper quadrant pain or radiation into the right shoulder which is more classic.

## 2020-09-01 ENCOUNTER — Other Ambulatory Visit: Payer: Self-pay | Admitting: Family Medicine

## 2020-09-21 ENCOUNTER — Other Ambulatory Visit: Payer: Self-pay | Admitting: Nurse Practitioner

## 2020-11-02 ENCOUNTER — Other Ambulatory Visit: Payer: Self-pay | Admitting: Nurse Practitioner

## 2020-11-02 NOTE — Telephone Encounter (Signed)
Pt returned call. Pt states she takes at least 12 tablets per month. The heat gives her a headache. She also takes 3 ibuprofen along with Sumatriptan. With hot weather she gets a headache daily. Please advise. Thank you

## 2020-11-02 NOTE — Telephone Encounter (Signed)
Left message to return call 

## 2020-11-20 ENCOUNTER — Telehealth: Payer: Self-pay | Admitting: Gastroenterology

## 2020-11-20 NOTE — Telephone Encounter (Signed)
Please let pt know that received labs finally from her Rheumatologist. She did not have any anemia labs.   If patient is willing, let's have her get CBC, tTG IgA at her convenience.

## 2020-11-22 ENCOUNTER — Other Ambulatory Visit: Payer: Self-pay | Admitting: *Deleted

## 2020-11-22 DIAGNOSIS — D509 Iron deficiency anemia, unspecified: Secondary | ICD-10-CM

## 2020-11-22 NOTE — Telephone Encounter (Signed)
Spoke to pt. Informed her that she needed labs. She requested to have them done at [place] have been sent.

## 2020-12-11 ENCOUNTER — Other Ambulatory Visit: Payer: Self-pay | Admitting: Family Medicine

## 2020-12-11 NOTE — Telephone Encounter (Signed)
Pt hasn't not been seen by me. Dr. Lovena Le

## 2020-12-13 ENCOUNTER — Telehealth: Payer: Self-pay | Admitting: Family Medicine

## 2020-12-13 NOTE — Telephone Encounter (Signed)
Pt needs appt with Dr.Taylor per last refill request. Thank you!

## 2020-12-13 NOTE — Telephone Encounter (Signed)
Patient is needing refill on sumatriptan 50 mg called into [facility]

## 2020-12-13 NOTE — Telephone Encounter (Signed)
I will refill medicine but want to find out if she is taking 12 per month or just having extra if needed due to upcoming changes. Thanks.

## 2021-01-04 ENCOUNTER — Ambulatory Visit
Admission: EM | Admit: 2021-01-04 | Discharge: 2021-01-04 | Disposition: A | Discharge status: Home / Self Care | Payer: No Typology Code available for payment source | Attending: Emergency Medicine | Admitting: Emergency Medicine

## 2021-01-04 ENCOUNTER — Other Ambulatory Visit: Payer: Self-pay

## 2021-01-04 DIAGNOSIS — L237 Allergic contact dermatitis due to plants, except food: Secondary | ICD-10-CM

## 2021-01-04 DIAGNOSIS — L299 Pruritus, unspecified: Secondary | ICD-10-CM

## 2021-01-04 MED ORDER — TRIAMCINOLONE ACETONIDE 0.1 % EX CREA: 1.0000 "application " | TOPICAL_CREAM | Freq: Two times a day (BID) | CUTANEOUS | 0 refills | Status: DC

## 2021-01-04 MED ORDER — HYDROXYZINE HCL 25 MG PO TABS: 25.0000 mg | ORAL_TABLET | Freq: Four times a day (QID) | ORAL | 0 refills | Status: DC

## 2021-01-04 NOTE — Discharge Instructions (Addendum)
Wash with warm water and mild soap Triamcinolone cream prescribed Hydroxyzine for itching.  Do not take prior to driving or operating heavy machinery as this medication may cause drowsiness Follow up with PCP if symptoms persists Return or go to the ER if you have any new or worsening symptoms such as fever, chills, nausea, vomiting, redness, swelling, discharge, if symptoms do not improve with medications, etc..Marland Kitchen

## 2021-01-04 NOTE — ED Provider Notes (Addendum)
[place]   [id number] 01/04/21 Arrival Time: 1548  CC: Rash  SUBJECTIVE:  Rhonda Harris is a 60 y.o. female who presents with an itchy poison oak rash to leg x couple of days.  Denies alleviating or aggravating factors.  Reports similar symptoms in the past with poison oak.   Denies fever, chills, nausea, vomiting, dyspnea, dysphagia, SOB.  ROS: As per HPI.  All other pertinent ROS negative.     Past Medical History:  Diagnosis Date   Celiac disease March 2015   Esophageal erosions    GERD (gastroesophageal reflux disease)    Past Surgical History:  Procedure Laterality Date   BIOPSY  02/04/2018   Procedure: BIOPSY;  Surgeon: Daneil Dolin, MD;  [facility];  Service: Endoscopy;;  duodenal bx's   BIOPSY  02/25/2018   Procedure: BIOPSY;  Surgeon: Daneil Dolin, MD;  [facility];  Service: Endoscopy;;  ileum   COLONOSCOPY N/A 02/25/2018   Dr. Gala Romney: Diverticulosis, 5 cm of the terminal ileum appeared eroded and slightly cobblestoned.  Ileal biopsy with prominent lymphoid aggregates, negative for dysplasia.   COLONOSCOPY WITH ESOPHAGOGASTRODUODENOSCOPY (EGD) N/A 07/12/2013   Dr. Gala Romney: EGD revealed erosive reflux esophagitis, large hiatal hernia, antral erosions, abnormal duodenum with path revealing partially developed celiac sprue. Celiac serologies normal but felt to be dealing with celiac disease. Colonoscopy with ileal erosions, negative path.    ESOPHAGOGASTRODUODENOSCOPY N/A 02/04/2018   Dr. Gala Romney: Nonobstructing Schatzki ring status post dilation.  Large hiatal hernia.  Scattered mild mucosal changes in the second portion of the duodenum, slightly frondy but no scalloping.  Small bowel biopsy with mild hyperemia and edema, no features of sprue, active inflammation or granulomas.  Of note, patient was on a gluten-free diet for 4 years prior to this biopsy.   GIVENS CAPSULE STUDY N/A 03/23/2018   Few erosions/?  Ulcerations noted.  Abnormal  villi towards distal small intestines of uncertain significance.  Prominent fold versus benign-appearing polyp with erosion noted at 2 hours 37 minutes 24 seconds.   MALONEY DILATION N/A 02/04/2018   Procedure: Venia Minks DILATION;  Surgeon: Daneil Dolin, MD;  [facility];  Service: Endoscopy;  Laterality: N/A;   TUBAL LIGATION     No Known Allergies No current facility-administered medications on file prior to encounter.   Current Outpatient Medications on File Prior to Encounter  Medication Sig Dispense Refill   Calcium Carb-Cholecalciferol (CALCIUM + VITAMIN D3 PO) Take 1 tablet by mouth daily.     ferrous sulfate 325 (65 FE) MG tablet Take 325 mg by mouth daily with breakfast.     hydroxychloroquine (PLAQUENIL) 200 MG tablet Take 200 mg by mouth 2 (two) times daily.     Multiple Vitamin (MULTIVITAMIN) tablet Take 2 tablets by mouth daily.      nabumetone (RELAFEN) 500 MG tablet Take 500 mg by mouth 2 (two) times daily.     pantoprazole (PROTONIX) 40 MG tablet TAKE ONE TABLET (40MG TOTAL) BY MOUTH ONE to TWO TIMES DAILY BEFORE A MEAL FOR REFLUX 60 tablet 11   SUMAtriptan (IMITREX) 50 MG tablet TAKE ONE TABLET BY MOUTH AT FIRST SIGN OF HEADACHE. 12 tablet 0   Social History   Socioeconomic History   Marital status: Married    Spouse name: Not on file   Number of children: Not on file   Years of education: Not on file   Highest education level: Not on file  Occupational History  Not on file  Tobacco Use   Smoking status: Former    Types: Cigarettes    Quit date: 07/02/1993    Years since quitting: 27.5   Smokeless tobacco: Never   Tobacco comments:    only smoked about 2 years as teenager  Vaping Use   Vaping Use: Never used  Substance and Sexual Activity   Alcohol use: Yes    Comment: rare social    Drug use: No   Sexual activity: Yes    Birth control/protection: Post-menopausal, Surgical  Other Topics Concern   Not on file  Social History Narrative   Not  on file   Social Determinants of Health   Financial Resource Strain: Not on file  Food Insecurity: Not on file  Transportation Needs: Not on file  Physical Activity: Not on file  Stress: Not on file  Social Connections: Not on file  Intimate Partner Violence: Not on file   Family History  Problem Relation Age of Onset   Osteoporosis Mother    Cancer Father 24       pancreatic   Colon cancer Neg Hx    Gastric cancer Neg Hx    Cancer - Other Neg Hx        small bowel    OBJECTIVE: Vitals:   01/04/21 1602  BP: 133/85  Pulse: 77  Resp: 18  Temp: 98.2 F (36.8 C)  SpO2: 94%    General appearance: alert; no distress Head: NCAT Lungs: normal respiratory effort Extremities: no edema Skin: warm and dry; erythematous vesicles/ papules two to LT leg, and one to back of RT thigh Psychological: alert and cooperative; normal mood and affect  ASSESSMENT & PLAN:  1. Poison ivy dermatitis   2. Itching     Meds ordered this encounter  Medications   hydrOXYzine (ATARAX/VISTARIL) 25 MG tablet    Sig: Take 1 tablet (25 mg total) by mouth every 6 (six) hours.    Dispense:  12 tablet    Refill:  0    Order Specific Question:   Supervising Provider    Answer:   Raylene Everts [[id number]]   triamcinolone cream (KENALOG) 0.1 %    Sig: Apply 1 application topically 2 (two) times daily.    Dispense:  30 g    Refill:  0    Order Specific Question:   Supervising Provider    Answer:   Raylene Everts [[id number]]    @[facility] @  Wash with warm water and mild soap Triamcinolone cream prescribed Hydroxyzine for itching.  Do not take prior to driving or operating heavy machinery as this medication may cause drowsiness Follow up with PCP if symptoms persists Return or go to the ER if you have any new or worsening symptoms such as fever, chills, nausea, vomiting, redness, swelling, discharge, if symptoms do not improve with medications, etc...  Reviewed expectations re: course of  current medical issues. Questions answered. Outlined signs and symptoms indicating need for more acute intervention. Patient verbalized understanding. After Visit Summary given.    Lestine Box, PA-C 01/04/21 [place], [place], [place] 01/04/21 1610

## 2021-01-04 NOTE — ED Triage Notes (Signed)
Pt presents with rash on leg for past couple of days, believes to be poison oak

## 2021-01-18 ENCOUNTER — Other Ambulatory Visit: Payer: Self-pay | Admitting: Nurse Practitioner

## 2021-01-18 NOTE — Telephone Encounter (Signed)
Pt hasn't been seen by carolyn since 11/21.  Never established with me as primary.  Needs appt. Dr. Lovena Le

## 2021-01-19 NOTE — Telephone Encounter (Signed)
Patient called again today wanting refill explained to her must be seen by Dr. Lovena Le per doctor to get refill because she hasnt been since 02/11/2020

## 2021-02-19 ENCOUNTER — Encounter: Payer: Self-pay | Admitting: Family Medicine

## 2021-02-19 DIAGNOSIS — M069 Rheumatoid arthritis, unspecified: Secondary | ICD-10-CM | POA: Insufficient documentation

## 2021-02-19 DIAGNOSIS — G43909 Migraine, unspecified, not intractable, without status migrainosus: Secondary | ICD-10-CM | POA: Insufficient documentation

## 2021-02-19 DIAGNOSIS — K219 Gastro-esophageal reflux disease without esophagitis: Secondary | ICD-10-CM | POA: Insufficient documentation

## 2021-02-19 DIAGNOSIS — R7303 Prediabetes: Secondary | ICD-10-CM | POA: Insufficient documentation

## 2021-02-20 ENCOUNTER — Other Ambulatory Visit: Payer: Self-pay

## 2021-02-20 ENCOUNTER — Ambulatory Visit: Payer: No Typology Code available for payment source | Admitting: Family Medicine

## 2021-02-20 DIAGNOSIS — M8580 Other specified disorders of bone density and structure, unspecified site: Secondary | ICD-10-CM | POA: Diagnosis not present

## 2021-02-20 DIAGNOSIS — D509 Iron deficiency anemia, unspecified: Secondary | ICD-10-CM | POA: Diagnosis not present

## 2021-02-20 DIAGNOSIS — E785 Hyperlipidemia, unspecified: Secondary | ICD-10-CM

## 2021-02-20 DIAGNOSIS — M0609 Rheumatoid arthritis without rheumatoid factor, multiple sites: Secondary | ICD-10-CM

## 2021-02-20 DIAGNOSIS — G43709 Chronic migraine without aura, not intractable, without status migrainosus: Secondary | ICD-10-CM

## 2021-02-20 DIAGNOSIS — K900 Celiac disease: Secondary | ICD-10-CM

## 2021-02-20 MED ORDER — SUMATRIPTAN SUCCINATE 50 MG PO TABS: ORAL_TABLET | ORAL | 3 refills | Status: DC

## 2021-02-20 NOTE — Assessment & Plan Note (Signed)
Has been stable. Patient wanted to defer labs at this time since she is doing well.

## 2021-02-20 NOTE — Progress Notes (Signed)
Subjective:  Patient ID: Rhonda Harris, female    DOB: [birth year]  Age: 60 y.o. MRN: [id number]  CC: Chief Complaint  Patient presents with   Migraine    Follow up- Needs refill of Imitrex No problems or concerns per patient    HPI:  60 year old female with a history of celiac disease, anemia, prediabetes, hyperlipidemia, migraine, rheumatoid arthritis presents for medication refill and to establish care with me.  Migraine Patient has longstanding history of migraine.  She states that she has been out of her Imitrex for quite some time.  Patient states that she has periods of time where she has upwards of 12 migraines a month. Patient states that her migraines respond well to Imitrex. Patient has typical migraine symptoms of headache, nausea, and associated photophobia. She has never been on prophylactic medication. Will discuss this with her today.  History of anemia Patient has a history of profound anemia in 2015.  Hemoglobin at that time was 5.5.  She had a transfusion and had a thorough work-up which revealed biopsies consistent with celiac disease.  She had negative serologies.  Since that time she has had additional biopsies which have been negative.  She is currently not on a gluten-free diet.  She states that she is doing well. Last CBC that I can see was on 05/17/2019 and hemoglobin was 15.3.  Health maintenance Patient in need of influenza and shingles vaccines per guideline recommendations.  Patient declines influenza and shingles vaccines today. Patient also is due for her Pap smear.  Patient Active Problem List   Diagnosis Date Noted   Hyperlipidemia 02/20/2021   GERD (gastroesophageal reflux disease) 02/19/2021   Prediabetes 02/19/2021   Migraine 02/19/2021   Rheumatoid arthritis ([place]) 02/19/2021   IDA (iron deficiency anemia) 05/15/2018   Abnormal CT scan, small bowel 05/15/2018   Large hiatal hernia 01/30/2018   Osteopenia 10/31/2014   Chronic low back pain  08/16/2013   Morbid obesity ([place]) 08/07/2013   Celiac disease 08/02/2013    Social Hx   Social History   Socioeconomic History   Marital status: Married    Spouse name: Not on file   Number of children: Not on file   Years of education: Not on file   Highest education level: Not on file  Occupational History   Not on file  Tobacco Use   Smoking status: Former    Types: Cigarettes    Quit date: 07/02/1993    Years since quitting: 27.6   Smokeless tobacco: Never   Tobacco comments:    only smoked about 2 years as teenager  Vaping Use   Vaping Use: Never used  Substance and Sexual Activity   Alcohol use: Yes    Comment: rare social    Drug use: No   Sexual activity: Yes    Birth control/protection: Post-menopausal, Surgical  Other Topics Concern   Not on file  Social History Narrative   Not on file   Social Determinants of Health   Financial Resource Strain: Not on file  Food Insecurity: Not on file  Transportation Needs: Not on file  Physical Activity: Not on file  Stress: Not on file  Social Connections: Not on file    Review of Systems  Eyes:  Positive for photophobia.  Gastrointestinal:  Positive for nausea.  Neurological:  Positive for headaches.    Objective:  BP 137/86   Pulse 75   Ht 5' 5"  (1.651 m)   Wt 244 lb 9.6 oz (110.9  kg)   SpO2 98%   BMI 40.70 kg/m   BP/Weight 02/20/2021 01/04/2021 4/78/2956  Systolic BP 213 086 578  Diastolic BP 86 85 88  Wt. (Lbs) 244.6 - 235.6  BMI 40.7 - 39.21    Physical Exam Vitals and nursing note reviewed.  Constitutional:      General: She is not in acute distress.    Appearance: Normal appearance. She is obese. She is not ill-appearing.  HENT:     Head: Normocephalic and atraumatic.  Cardiovascular:     Rate and Rhythm: Normal rate and regular rhythm.     Heart sounds: No murmur heard. Pulmonary:     Effort: Pulmonary effort is normal.     Breath sounds: Normal breath sounds. No wheezing, rhonchi or  rales.  Neurological:     Mental Status: She is alert.  Psychiatric:        Mood and Affect: Mood normal.        Behavior: Behavior normal.    Lab Results  Component Value Date   WBC 5.3 05/17/2019   HGB 15.3 05/17/2019   HCT 45.6 (H) 05/17/2019   PLT 210 05/17/2019   GLUCOSE 90 03/23/2020   CHOL 224 (H) 03/23/2020   TRIG 83 03/23/2020   HDL 70 03/23/2020   LDLCALC 136 (H) 03/23/2020   ALT 17 03/23/2020   AST 20 03/23/2020   NA 141 03/23/2020   K 4.8 03/23/2020   CL 106 03/23/2020   CREATININE 0.70 03/23/2020   BUN 13 03/23/2020   CO2 29 03/23/2020   TSH 0.99 03/23/2020   INR 0.92 07/10/2013   HGBA1C 5.8 (H) 03/23/2020     Assessment & Plan:   Problem List Items Addressed This Visit     Celiac disease    Doing well. Follows with GI.      Hyperlipidemia   IDA (iron deficiency anemia)    Has been stable. Patient wanted to defer labs at this time since she is doing well.      Migraine    Chronic.  Responds well to Imitrex.  We will continue.  Refill placed today. I discussed prophylactic medication with her and she will consider.      Relevant Medications   SUMAtriptan (IMITREX) 50 MG tablet   Osteopenia   Rheumatoid arthritis ([place])    Follow-up:  Return 6 months, for Needs to see NP for pap smear; labs at that time as well.Thersa Salt DO [place]

## 2021-02-20 NOTE — Patient Instructions (Signed)
It was a pleasure to meet you today.  I have refilled your Migraine medication.  Please see our NP to get your pap smear this year.  Follow up in 6 months - 1 year.  Take care  Dr. Lacinda Axon

## 2021-02-20 NOTE — Assessment & Plan Note (Signed)
Chronic.  Responds well to Imitrex.  We will continue.  Refill placed today. I discussed prophylactic medication with her and she will consider.

## 2021-02-20 NOTE — Assessment & Plan Note (Signed)
Doing well. Follows with GI.

## 2021-03-30 ENCOUNTER — Encounter: Payer: No Typology Code available for payment source | Admitting: Nurse Practitioner

## 2021-04-13 ENCOUNTER — Telehealth: Payer: Self-pay | Admitting: Family Medicine

## 2021-04-13 NOTE — Telephone Encounter (Signed)
PA for Sumatriptan 50 mg has been approved. Approval good from 04/11/21-04/10/2024.

## 2021-06-27 ENCOUNTER — Ambulatory Visit
Admission: EM | Admit: 2021-06-27 | Discharge: 2021-06-27 | Disposition: A | Discharge status: Home / Self Care | Payer: No Typology Code available for payment source | Attending: Family Medicine | Admitting: Family Medicine

## 2021-06-27 ENCOUNTER — Other Ambulatory Visit: Payer: Self-pay

## 2021-06-27 DIAGNOSIS — L03116 Cellulitis of left lower limb: Secondary | ICD-10-CM | POA: Diagnosis not present

## 2021-06-27 DIAGNOSIS — L570 Actinic keratosis: Secondary | ICD-10-CM

## 2021-06-27 DIAGNOSIS — L989 Disorder of the skin and subcutaneous tissue, unspecified: Secondary | ICD-10-CM | POA: Diagnosis not present

## 2021-06-27 MED ORDER — MUPIROCIN 2 % EX OINT: 1.0000 "application " | TOPICAL_OINTMENT | Freq: Two times a day (BID) | CUTANEOUS | 0 refills | Status: DC

## 2021-06-27 MED ORDER — SULFAMETHOXAZOLE-TRIMETHOPRIM 800-160 MG PO TABS: 1.0000 | ORAL_TABLET | Freq: Two times a day (BID) | ORAL | 0 refills | Status: AC

## 2021-06-27 NOTE — ED Provider Notes (Signed)
[facility]    CSN: [id number] Arrival date & time: 06/27/21  1519      History   Chief Complaint Chief Complaint  Patient presents with   Hand Injury   Hand Problem    HPI Rhonda Harris is a 61 y.o. female.   Presenting today with a lump on her right dorsal hand that has been present the past 2 weeks and is sore to the touch.  She denies any drainage, injury to the area, fever, chills, history of injury in this area.  She has not tried anything over-the-counter for it thus far.  She states she has a similar area on the anterior left lower leg that has been present for about a year and sometimes gets very red and swollen in the surrounding area.  It is currently red and swollen and she has pustules forming on the outskirts of the area of erythema.  She denies injury to that area as well.  Not trying anything for symptoms.   Past Medical History:  Diagnosis Date   Celiac disease March 2015   Esophageal erosions    GERD (gastroesophageal reflux disease)     Patient Active Problem List   Diagnosis Date Noted   Hyperlipidemia 02/20/2021   GERD (gastroesophageal reflux disease) 02/19/2021   Prediabetes 02/19/2021   Migraine 02/19/2021   Rheumatoid arthritis ([place]) 02/19/2021   IDA (iron deficiency anemia) 05/15/2018   Abnormal CT scan, small bowel 05/15/2018   Large hiatal hernia 01/30/2018   Osteopenia 10/31/2014   Chronic low back pain 08/16/2013   Morbid obesity ([place]) 08/07/2013   Celiac disease 08/02/2013    Past Surgical History:  Procedure Laterality Date   BIOPSY  02/04/2018   Procedure: BIOPSY;  Surgeon: Daneil Dolin, MD;  [facility];  Service: Endoscopy;;  duodenal bx's   BIOPSY  02/25/2018   Procedure: BIOPSY;  Surgeon: Daneil Dolin, MD;  [facility];  Service: Endoscopy;;  ileum   COLONOSCOPY N/A 02/25/2018   Dr. Gala Romney: Diverticulosis, 5 cm of the terminal ileum appeared eroded and slightly cobblestoned.  Ileal  biopsy with prominent lymphoid aggregates, negative for dysplasia.   COLONOSCOPY WITH ESOPHAGOGASTRODUODENOSCOPY (EGD) N/A 07/12/2013   Dr. Gala Romney: EGD revealed erosive reflux esophagitis, large hiatal hernia, antral erosions, abnormal duodenum with path revealing partially developed celiac sprue. Celiac serologies normal but felt to be dealing with celiac disease. Colonoscopy with ileal erosions, negative path.    ESOPHAGOGASTRODUODENOSCOPY N/A 02/04/2018   Dr. Gala Romney: Nonobstructing Schatzki ring status post dilation.  Large hiatal hernia.  Scattered mild mucosal changes in the second portion of the duodenum, slightly frondy but no scalloping.  Small bowel biopsy with mild hyperemia and edema, no features of sprue, active inflammation or granulomas.  Of note, patient was on a gluten-free diet for 4 years prior to this biopsy.   GIVENS CAPSULE STUDY N/A 03/23/2018   Few erosions/?  Ulcerations noted.  Abnormal villi towards distal small intestines of uncertain significance.  Prominent fold versus benign-appearing polyp with erosion noted at 2 hours 37 minutes 24 seconds.   MALONEY DILATION N/A 02/04/2018   Procedure: Venia Minks DILATION;  Surgeon: Daneil Dolin, MD;  [facility];  Service: Endoscopy;  Laterality: N/A;   TUBAL LIGATION      OB History   No obstetric history on file.    Home Medications    Prior to Admission medications   Medication Sig Start Date End Date Taking? Authorizing Provider  mupirocin ointment (BACTROBAN) 2 % Apply 1 application topically 2 (two) times daily. 06/27/21  Yes Volney American, PA-C  sulfamethoxazole-trimethoprim (BACTRIM DS) 800-160 MG tablet Take 1 tablet by mouth 2 (two) times daily for 7 days. 06/27/21 07/04/21 Yes Volney American, PA-C  Calcium Carb-Cholecalciferol (CALCIUM + VITAMIN D3 PO) Take 1 tablet by mouth daily.    [provider]  ferrous sulfate 325 (65 FE) MG tablet Take 325 mg by mouth daily with breakfast.     [provider]  hydroxychloroquine (PLAQUENIL) 200 MG tablet Take 200 mg by mouth 2 (two) times daily. 08/22/20   [provider]  hydrOXYzine (ATARAX/VISTARIL) 25 MG tablet Take 1 tablet (25 mg total) by mouth every 6 (six) hours. Patient not taking: Reported on 02/20/2021 01/04/21   Stacey Drain [place], PA-C  Multiple Vitamin (MULTIVITAMIN) tablet Take 2 tablets by mouth daily.     [provider]  nabumetone (RELAFEN) 500 MG tablet Take 500 mg by mouth 2 (two) times daily. 08/22/20   [provider]  pantoprazole (PROTONIX) 40 MG tablet TAKE ONE TABLET (40MG  TOTAL) BY MOUTH ONE to TWO TIMES DAILY BEFORE A MEAL FOR REFLUX 08/23/20   Mahala Menghini, PA-C  SUMAtriptan (IMITREX) 50 MG tablet May repeat in 2 hours if headache persists or recurs. 02/20/21   Coral Spikes, DO  triamcinolone cream (KENALOG) 0.1 % Apply 1 application topically 2 (two) times daily. Patient not taking: Reported on 02/20/2021 01/04/21   Lestine Box, PA-C   Family History Family History  Problem Relation Age of Onset   Osteoporosis Mother    Cancer Father 81       pancreatic   Colon cancer Neg Hx    Gastric cancer Neg Hx    Cancer - Other Neg Hx        small bowel   Social History Social History   Tobacco Use   Smoking status: Some Days    Types: Cigarettes    Last attempt to quit: 07/02/1993    Years since quitting: 28.0    Passive exposure: Never   Smokeless tobacco: Never   Tobacco comments:    Smokes ciggs some days when stressed at work  Scientific laboratory technician Use: Never used  Substance Use Topics   Alcohol use: Yes    Comment: rare social    Drug use: No   Allergies   Patient has no known allergies.  Review of Systems Review of Systems Per HPI  Physical Exam Triage Vital Signs ED Triage Vitals  Enc Vitals Group     BP 06/27/21 1638 139/87     Pulse Rate 06/27/21 1638 69     Resp 06/27/21 1638 18     Temp 06/27/21 1638 (!) 97.5 F (36.4 C)     Temp  Source 06/27/21 1638 Oral     SpO2 06/27/21 1638 98 %     Weight --      Height --      Head Circumference --      Peak Flow --      Pain Score 06/27/21 1640 5     Pain Loc --      Pain Edu? --      Excl. in [place]? --    No data found.  Updated Vital Signs BP 139/87 (BP Location: Right Arm)    Pulse 69    Temp (!) 97.5 F (36.4 C) (Oral)    Resp 18    SpO2  98%   Visual Acuity Right Eye Distance:   Left Eye Distance:   Bilateral Distance:    Right Eye Near:   Left Eye Near:    Bilateral Near:     Physical Exam Vitals and nursing note reviewed.  Constitutional:      Appearance: Normal appearance. She is not ill-appearing.  HENT:     Head: Atraumatic.  Eyes:     Extraocular Movements: Extraocular movements intact.     Conjunctiva/sclera: Conjunctivae normal.  Cardiovascular:     Rate and Rhythm: Normal rate and regular rhythm.     Heart sounds: Normal heart sounds.  Pulmonary:     Effort: Pulmonary effort is normal.     Breath sounds: Normal breath sounds.  Musculoskeletal:        General: Normal range of motion.     Cervical back: Normal range of motion and neck supple.  Skin:    General: Skin is warm.     Comments: Left anterior lower leg diffusely erythematous, mildly edematous with a skin lesion centrally, no fluctuance or drainage.  Multiple pustules present in the surrounding area.  Raised lesion on the right dorsal surface of hand with a central scabbing present, nonfluctuant, no drainage or bleeding.  No surrounding erythema.  Numerous actinic keratoses widespread across extremities, worse on the right upper extremity  Neurological:     Mental Status: She is alert and oriented to person, place, and time.  Psychiatric:        Mood and Affect: Mood normal.        Thought Content: Thought content normal.        Judgment: Judgment normal.   UC Treatments / Results  Labs (all labs ordered are listed, but only abnormal results are displayed) Labs Reviewed - No  data to display  EKG   Radiology No results found.  Procedures Procedures (including critical care time)  Medications Ordered in UC Medications - No data to display  Initial Impression / Assessment and Plan / UC Course  I have reviewed the triage vital signs and the nursing notes.  Pertinent labs & imaging results that were available during my care of the patient were reviewed by me and considered in my medical decision making (see chart for details).     Will cover for cellulitis of the lower extremity with Bactrim, mupirocin ointment for both areas.  Keep covered.  Close dermatology follow-up recommended for possible biopsy of both areas as well as treatment of the widespread actinic keratoses. Final Clinical Impressions(s) / UC Diagnoses   Final diagnoses:  Skin lesion  Cellulitis of left lower extremity  Actinic keratoses     Discharge Instructions      Follow-up with the dermatologist as soon as you are able to to recheck these areas and possibly biopsy them     ED Prescriptions     Medication Sig Dispense Auth. Provider   sulfamethoxazole-trimethoprim (BACTRIM DS) 800-160 MG tablet Take 1 tablet by mouth 2 (two) times daily for 7 days. 14 tablet Volney American, [place]   mupirocin ointment (BACTROBAN) 2 % Apply 1 application topically 2 (two) times daily. 22 g Volney American, [place]      PDMP not reviewed this encounter.   Volney American, [place] 06/27/21 1808

## 2021-06-27 NOTE — Discharge Instructions (Signed)
Follow-up with the dermatologist as soon as you are able to to recheck these areas and possibly biopsy them

## 2021-06-27 NOTE — ED Triage Notes (Signed)
Patient states she has a bump on her right hand for the past two weeks that sore to the touch    Denies Fever

## 2021-06-28 ENCOUNTER — Other Ambulatory Visit (HOSPITAL_COMMUNITY): Payer: Self-pay | Admitting: Family Medicine

## 2021-06-28 DIAGNOSIS — Z1231 Encounter for screening mammogram for malignant neoplasm of breast: Secondary | ICD-10-CM

## 2021-07-02 ENCOUNTER — Ambulatory Visit (HOSPITAL_COMMUNITY)
Admission: RE | Admit: 2021-07-02 | Discharge: 2021-07-02 | Disposition: A | Discharge status: Home / Self Care | Payer: No Typology Code available for payment source | Source: Ambulatory Visit | Attending: Family Medicine | Admitting: Family Medicine

## 2021-07-02 ENCOUNTER — Other Ambulatory Visit: Payer: Self-pay

## 2021-07-02 DIAGNOSIS — Z1231 Encounter for screening mammogram for malignant neoplasm of breast: Secondary | ICD-10-CM | POA: Diagnosis not present

## 2021-07-23 ENCOUNTER — Telehealth: Payer: Self-pay

## 2021-07-23 ENCOUNTER — Encounter: Payer: Self-pay | Admitting: Gastroenterology

## 2021-07-23 ENCOUNTER — Other Ambulatory Visit: Payer: Self-pay

## 2021-07-23 ENCOUNTER — Ambulatory Visit (INDEPENDENT_AMBULATORY_CARE_PROVIDER_SITE_OTHER): Payer: No Typology Code available for payment source | Admitting: Gastroenterology

## 2021-07-23 VITALS — BP 138/80 | HR 75 | Temp 98.2°F | Ht 66.0 in | Wt 240.6 lb

## 2021-07-23 DIAGNOSIS — K219 Gastro-esophageal reflux disease without esophagitis: Secondary | ICD-10-CM | POA: Diagnosis not present

## 2021-07-23 DIAGNOSIS — K900 Celiac disease: Secondary | ICD-10-CM | POA: Diagnosis not present

## 2021-07-23 DIAGNOSIS — R1013 Epigastric pain: Secondary | ICD-10-CM

## 2021-07-23 DIAGNOSIS — K449 Diaphragmatic hernia without obstruction or gangrene: Secondary | ICD-10-CM

## 2021-07-23 NOTE — Telephone Encounter (Signed)
US abdomen scheduled for 08/06/21 at 8:30am, arrive at 8:00am. NPO after midnight prior to test.  ? ?Called pt, informed her of [place] appt. Appt letter mailed. ?

## 2021-07-23 NOTE — Patient Instructions (Signed)
Please go for labs, 4-6 hours after your next episode of abdominal pain. If occurring at night, you can go first thing the next morning. I also have included routine labs for follow up of your celiac disease.  ?Abdominal ultrasound to be scheduled.  ?We will be in touch with results and recommendations.  ?

## 2021-07-23 NOTE — Progress Notes (Signed)
Primary Care Physician: Coral Spikes, DO  Primary Gastroenterologist:  Garfield Cornea, MD   Chief Complaint  Patient presents with   Abdominal Pain    Having pain after eating. The pain starts in top of stomach and then moves to left side.     HPI: Rhonda Harris is a 61 y.o. female here for follow-up.  Last seen in April 2022.  She has a history of large hiatal hernia, dysphagia, IDA, celiac disease (based on pathology March 2015 although her serologies were negative at that time, presented with profound IDA with hemoglobin of 5.4 and MCV of 58).    Patient reports issues with postprandial epigastric pain, radiates into LUQ pain and into back. Almost always associated with her evening meal. Now associated with N/V. Can go a week and no symptoms. And then sometimes regularly. She did note similar symptoms at time of her last ov but symptoms were infrequent. Now having worsening over the past several months. She has been taking her pantoprazole BID with good control of heartburn symptoms. No dysphagia. Abdominal pain will last for hours at a time. Will go to bed to try to get relief. Sometimes tries OTC antacids but no relief. BM regular. No melena, brbpr. She says the pain can be miserable at time. Has been able to walk off they pain at times. Denies diaphoresis or shortness of breath with it.    Prior GI work up:   EGD/colonoscopy March 2015 revealed erosive reflux esophagitis, large hiatal hernia, antral erosions, abnormal duodenum with path revealing partially developed celiac sprue.  Celiac serologies normal but felt to be dealing with celiac disease.  Colonoscopy at that time with ileal erosions, negative path.   Last EGD in September 2019 for dysphagia and abdominal pain.  Large hiatal hernia, nonobstructing Schatzki ring which was dilated.  Scattered mild mucosal changes found in the second portion duodenum, biopsies benign without features of sprue.  Of note she basically has  been on gluten-free diet for 4 years at time of this EGD.  TTG was repeated and was negative.  TTG negative in 2015 as well.   Colonoscopy October 2019 showed diverticulosis, 5 cm of the terminal ileum appeared eroded and slightly cobblestoned.  Ileal biopsy with prominent lymphoid aggregates but negative for dysplasia.    Capsule study done November 2019 with scattered lymphangiectasia's, small nonbleeding few erosions/ulcers around 1 hour 5 minutes 52 seconds.  Prominent fold versus benign-appearing polyp with erosion noted at 2 hours 37 minutes 24 seconds.  Abnormal villi of uncertain significance at 3 hours 26 minutes 46 seconds.  Possible few erosions of the TI.     Abnormal capsule led to a CTE (04/2018) to evaluate fold versus polyp.  She was noted to have a small 1 cm calcification adjacent to a small bowel loop in the pelvis which could be incidental but possibility of small calcified GIST not excluded.  Currently has 2 other calcifications that have been present since 2015 on CT.  MRE abdomen pelvis July 2020 "Focal indeterminate area of decreased T2 signal and increased is noted within the lower abdomen/pelvis corresponding to the area of calcification noted on previous exam. This may represent the calcification seen on previous CT of the abdomen pelvis. This does not appear to be changed in size. However, because calcifications will show up as signal void artifact similar to bowel gas, CT enterography would be a better modality for following up calcifications associated with the bowel".  Plans for repeat CTE in 6 months to reassess small 1 cm calcification adjacent to small bowel loop in the pelvis. Patient never completed.    Current Outpatient Medications  Medication Sig Dispense Refill   Calcium Carb-Cholecalciferol (CALCIUM + VITAMIN D3 PO) Take 1 tablet by mouth daily.     ferrous sulfate 325 (65 FE) MG tablet Take 325 mg by mouth daily with breakfast.     hydroxychloroquine (PLAQUENIL)  200 MG tablet Take 200 mg by mouth 2 (two) times daily.     hydrOXYzine (ATARAX/VISTARIL) 25 MG tablet Take 1 tablet (25 mg total) by mouth every 6 (six) hours. 12 tablet 0   Multiple Vitamin (MULTIVITAMIN) tablet Take 2 tablets by mouth daily.      mupirocin ointment (BACTROBAN) 2 % Apply 1 application topically 2 (two) times daily. 22 g 0   nabumetone (RELAFEN) 500 MG tablet Take 500 mg by mouth 2 (two) times daily.     pantoprazole (PROTONIX) 40 MG tablet TAKE ONE TABLET (40MG TOTAL) BY MOUTH ONE to TWO TIMES DAILY BEFORE A MEAL FOR REFLUX 60 tablet 11   SUMAtriptan (IMITREX) 50 MG tablet May repeat in 2 hours if headache persists or recurs. 20 tablet 3   triamcinolone cream (KENALOG) 0.1 % Apply 1 application topically 2 (two) times daily. 30 g 0   No current facility-administered medications for this visit.    Allergies as of 07/23/2021   (No Known Allergies)    ROS:  General: Negative for anorexia, weight loss, fever, chills, fatigue, weakness. ENT: Negative for hoarseness, difficulty swallowing , nasal congestion. CV: Negative for chest pain, angina, palpitations, dyspnea on exertion, peripheral edema.  Respiratory: Negative for dyspnea at rest, dyspnea on exertion, cough, sputum, wheezing.  GI: See history of present illness. GU:  Negative for dysuria, hematuria, urinary incontinence, urinary frequency, nocturnal urination.  Endo: Negative for unusual weight change.    Physical Examination:   BP 138/80 (BP Location: Right Arm, Patient Position: Sitting, Cuff Size: Large)    Pulse 75    Temp 98.2 F (36.8 C) (Temporal)    Ht 5' 6"  (1.676 m)    Wt 240 lb 9.6 oz (109.1 kg)    SpO2 100%    BMI 38.83 kg/m   General: Well-nourished, well-developed in no acute distress.  Eyes: No icterus. Mouth: masked Lungs: Clear to auscultation bilaterally.  Heart: Regular rate and rhythm, no murmurs rubs or gallops.  Abdomen: Bowel sounds are normal, nontender, nondistended, no  hepatosplenomegaly or masses, no abdominal bruits or hernia , no rebound or guarding.   Extremities: No lower extremity edema. No clubbing or deformities. Neuro: Alert and oriented x 4   Skin: Warm and dry, no jaundice.   Psych: Alert and cooperative, normal mood and affect.  Labs:  Lab Results  Component Value Date   CREATININE 0.70 03/23/2020   BUN 13 03/23/2020   NA 141 03/23/2020   K 4.8 03/23/2020   CL 106 03/23/2020   CO2 29 03/23/2020   Lab Results  Component Value Date   WBC 5.3 05/17/2019   HGB 15.3 05/17/2019   HCT 45.6 (H) 05/17/2019   MCV 89.6 05/17/2019   PLT 210 05/17/2019   Lab Results  Component Value Date   IRON 62 05/17/2019   TIBC 345 05/17/2019   FERRITIN 48 05/17/2019   Lab Results  Component Value Date   ALT 17 03/23/2020   AST 20 03/23/2020   ALKPHOS 59 11/06/2015   BILITOT 0.5 03/23/2020  Imaging Studies: MM 3D SCREEN BREAST BILATERAL  Result Date: 07/02/2021 CLINICAL DATA:  Screening. EXAM: DIGITAL SCREENING BILATERAL MAMMOGRAM WITH TOMOSYNTHESIS AND CAD TECHNIQUE: Bilateral screening digital craniocaudal and mediolateral oblique mammograms were obtained. Bilateral screening digital breast tomosynthesis was performed. The images were evaluated with computer-aided detection. COMPARISON:  Previous exam(s). ACR Breast Density Category b: There are scattered areas of fibroglandular density. FINDINGS: There are no findings suspicious for malignancy. IMPRESSION: No mammographic evidence of malignancy. A result letter of this screening mammogram will be mailed directly to the patient. RECOMMENDATION: Screening mammogram in one year. (Code:SM-B-01Y) BI-RADS CATEGORY  1: Negative. Electronically Signed   By: Lillia Mountain M.D.   On: 07/02/2021 11:01     Assessment:  Celiac disease: Has been on gluten-free diet for approximately 5 years. Update labs in near future for compliance, and recheck previously low normal vitamin D level.   Reflux esophagitis:  denies any heartburn on PPI BID. Does not always have to take twice daily but recently has been doing it since she is having abdominal pain.   Abnormal CT scan, small bowel: 1 cm calcified lesion adjacent to small bowel loop in the pelvis initially seen on small bowel capsule.  Has now been evaluated with CTE and MRE as outlined above.  Addressed recommendations for surveillance CTE with patient at last visit but she has previously declined pursuing additional imaging.  Findings may be insignificant but cannot rule out other etiologies such as GIST.  History of profound IDA: Presented in 2015 and 2019.  Patient did not complete labs as recommended after last visit. Will update now.  Epigastric pain: postprandial. More frequent than in the past but she had some at time of last ov. Walking has helped in the past. Recently episodes of vomiting with epigastric pain. Pain can go into left flank/left shoulder blade. No diaphoresis, sob. Suspect due to her large hiatal hernia but cannot exclude biliary etiology. Doubt cardiac origin.   Plan: Continue gluten free diet. Continue pantoprazole 68m 1-2 times daily. Labs to be done to evaluate her celiac disease and abdominal pain.

## 2021-08-06 ENCOUNTER — Other Ambulatory Visit: Payer: Self-pay

## 2021-08-06 ENCOUNTER — Ambulatory Visit (HOSPITAL_COMMUNITY)
Admission: RE | Admit: 2021-08-06 | Discharge: 2021-08-06 | Disposition: A | Discharge status: Home / Self Care | Payer: No Typology Code available for payment source | Source: Ambulatory Visit | Attending: Gastroenterology | Admitting: Gastroenterology

## 2021-08-06 DIAGNOSIS — K900 Celiac disease: Secondary | ICD-10-CM | POA: Diagnosis present

## 2021-08-06 DIAGNOSIS — K449 Diaphragmatic hernia without obstruction or gangrene: Secondary | ICD-10-CM | POA: Diagnosis present

## 2021-08-06 DIAGNOSIS — R1013 Epigastric pain: Secondary | ICD-10-CM | POA: Diagnosis present

## 2021-08-06 DIAGNOSIS — K219 Gastro-esophageal reflux disease without esophagitis: Secondary | ICD-10-CM | POA: Diagnosis present

## 2021-08-07 LAB — CBC WITH DIFFERENTIAL/PLATELET
Absolute Monocytes: 508 cells/uL (ref 200–950)
Basophils Absolute: 50 cells/uL (ref 0–200)
Basophils Relative: 1.2 %
Eosinophils Absolute: 59 cells/uL (ref 15–500)
Eosinophils Relative: 1.4 %
HCT: 42.9 % (ref 35.0–45.0)
Hemoglobin: 14.3 g/dL (ref 11.7–15.5)
Lymphs Abs: 1478 cells/uL (ref 850–3900)
MCH: 30 pg (ref 27.0–33.0)
MCHC: 33.3 g/dL (ref 32.0–36.0)
MCV: 90.1 fL (ref 80.0–100.0)
MPV: 12.1 fL (ref 7.5–12.5)
Monocytes Relative: 12.1 %
Neutro Abs: 2104 cells/uL (ref 1500–7800)
Neutrophils Relative %: 50.1 %
Platelets: 203 10*3/uL (ref 140–400)
RBC: 4.76 10*6/uL (ref 3.80–5.10)
RDW: 11.5 % (ref 11.0–15.0)
Total Lymphocyte: 35.2 %
WBC: 4.2 10*3/uL (ref 3.8–10.8)

## 2021-08-07 LAB — COMPREHENSIVE METABOLIC PANEL
AG Ratio: 1.6 (calc) (ref 1.0–2.5)
ALT: 24 U/L (ref 6–29)
AST: 28 U/L (ref 10–35)
Albumin: 3.9 g/dL (ref 3.6–5.1)
Alkaline phosphatase (APISO): 65 U/L (ref 37–153)
BUN: 13 mg/dL (ref 7–25)
CO2: 29 mmol/L (ref 20–32)
Calcium: 9.5 mg/dL (ref 8.6–10.4)
Chloride: 106 mmol/L (ref 98–110)
Creat: 0.9 mg/dL (ref 0.50–1.03)
Globulin: 2.4 g/dL (calc) (ref 1.9–3.7)
Glucose, Bld: 90 mg/dL (ref 65–99)
Potassium: 4.7 mmol/L (ref 3.5–5.3)
Sodium: 142 mmol/L (ref 135–146)
Total Bilirubin: 0.4 mg/dL (ref 0.2–1.2)
Total Protein: 6.3 g/dL (ref 6.1–8.1)

## 2021-08-07 LAB — LIPASE: Lipase: 33 U/L (ref 7–60)

## 2021-08-07 LAB — IGA: Immunoglobulin A: 262 mg/dL (ref 47–310)

## 2021-08-07 LAB — VITAMIN D 25 HYDROXY (VIT D DEFICIENCY, FRACTURES): Vit D, 25-Hydroxy: 54 ng/mL (ref 30–100)

## 2021-08-07 LAB — TISSUE TRANSGLUTAMINASE, IGA: (tTG) Ab, IgA: <1 U/mL

## 2021-08-16 ENCOUNTER — Telehealth: Payer: Self-pay

## 2021-08-16 ENCOUNTER — Other Ambulatory Visit: Payer: Self-pay

## 2021-08-16 NOTE — Telephone Encounter (Signed)
Unable to submit PA for EGD via [facility] website.  ? ?[facility], spoke to [place], no PA needed for EGD. Ref# [id number]. ?

## 2021-08-22 ENCOUNTER — Other Ambulatory Visit: Payer: Self-pay | Admitting: Family Medicine

## 2021-08-23 NOTE — Telephone Encounter (Signed)
Sent my chart message to schedule appointment 08/23/21 ?

## 2021-08-27 ENCOUNTER — Encounter: Payer: Self-pay | Admitting: Internal Medicine

## 2021-08-28 ENCOUNTER — Telehealth: Payer: Self-pay | Admitting: *Deleted

## 2021-08-28 ENCOUNTER — Other Ambulatory Visit: Payer: Self-pay | Admitting: Gastroenterology

## 2021-08-28 ENCOUNTER — Ambulatory Visit: Payer: No Typology Code available for payment source | Admitting: Family Medicine

## 2021-08-28 ENCOUNTER — Encounter: Payer: Self-pay | Admitting: Family Medicine

## 2021-08-28 DIAGNOSIS — G43709 Chronic migraine without aura, not intractable, without status migrainosus: Secondary | ICD-10-CM

## 2021-08-28 MED ORDER — SUMATRIPTAN SUCCINATE 50 MG PO TABS: ORAL_TABLET | ORAL | 6 refills | Status: DC

## 2021-08-28 MED ORDER — NURTEC 75 MG PO TBDP
75.0000 mg | ORAL_TABLET | ORAL | 1 refills | Status: DC
Start: 2021-08-28 — End: 2021-09-10

## 2021-08-28 NOTE — Telephone Encounter (Signed)
Prior Authorization for Nurtec denied by insurance. Patient must try and fail 2 preferred triptan therapies and be on preventative therapy ? ?Preferred tiptans include generic Relpax, Frova, Imitex, Amerge, Maxalt, Zomig ?

## 2021-08-28 NOTE — Assessment & Plan Note (Signed)
Chronic.  Worsening.  Headaches now occurring very frequently requiring frequent use of Imitrex. ?Imitrex as needed.  Starting on prophylactic medication.  Rx for Nurtec sent. ? ?

## 2021-08-28 NOTE — Patient Instructions (Signed)
Medication as prescribed. ? ?Follow up in 3-6 months. ? ?Take care ? ?Dr. Lacinda Axon  ?

## 2021-08-28 NOTE — Progress Notes (Signed)
? ?Subjective:  ?Patient ID: Rhonda Harris, female    DOB: 1960/10/07  Age: 61 y.o. MRN: [id number] ? ?CC: ?Chief Complaint  ?Patient presents with  ? Follow-up  ?  Following up on Imitrex. Headaches have worsened due to not having meds and pollen  ? ? ?HPI: ? ?61 year old female presents for follow-up regarding migraines. ? ?Patient reports that she is out of her medication.  Headaches are worsening.  Occurring more frequently.  She states that she has recently had to use Imitrex daily for migraine headache.  Migraines respond well to Imitrex.  However, given the worsening and frequent recurrence we need to discuss prophylactic treatment.  No other complaints or concerns at this time. ? ?Patient Active Problem List  ? Diagnosis Date Noted  ? Abdominal pain, epigastric 07/23/2021  ? Hyperlipidemia 02/20/2021  ? GERD (gastroesophageal reflux disease) 02/19/2021  ? Prediabetes 02/19/2021  ? Migraine 02/19/2021  ? Rheumatoid arthritis ([place]) 02/19/2021  ? IDA (iron deficiency anemia) 05/15/2018  ? Large hiatal hernia 01/30/2018  ? Osteopenia 10/31/2014  ? Morbid obesity ([place]) 08/07/2013  ? Celiac disease 08/02/2013  ? ? ?Social Hx   ?Social History  ? ?Socioeconomic History  ? Marital status: Married  ?  Spouse name: Not on file  ? Number of children: Not on file  ? Years of education: Not on file  ? Highest education level: Not on file  ?Occupational History  ? Not on file  ?Tobacco Use  ? Smoking status: Former  ?  Types: Cigarettes  ?  Quit date: 07/02/1993  ?  Years since quitting: 28.1  ?  Passive exposure: Never  ? Smokeless tobacco: Never  ? Tobacco comments:  ?  Smokes ciggs some days when stressed at work  ?Vaping Use  ? Vaping Use: Never used  ?Substance and Sexual Activity  ? Alcohol use: Not Currently  ?  Comment: rare social   ? Drug use: No  ? Sexual activity: Yes  ?  Birth control/protection: Post-menopausal, Surgical  ?Other Topics Concern  ? Not on file  ?Social History Narrative  ? Not on file   ? ?Social Determinants of Health  ? ?Financial Resource Strain: Not on file  ?Food Insecurity: Not on file  ?Transportation Needs: Not on file  ?Physical Activity: Not on file  ?Stress: Not on file  ?Social Connections: Not on file  ? ? ?Review of Systems ?Per HPI ? ?Objective:  ?BP (!) 145/87   Pulse 68   Temp 98.4 ?F (36.9 ?C)   Wt 239 lb 3.2 oz (108.5 kg)   SpO2 99%   BMI 38.61 kg/m?  ? ? ?  08/28/2021  ?  9:42 AM 07/23/2021  ?  1:59 PM 06/27/2021  ?  4:38 PM  ?BP/Weight  ?Systolic BP 263 785 885  ?Diastolic BP 87 80 87  ?Wt. (Lbs) 239.2 240.6   ?BMI 38.61 kg/m2 38.83 kg/m2   ? ? ?Physical Exam ?Vitals and nursing note reviewed.  ?Constitutional:   ?   General: She is not in acute distress. ?   Appearance: Normal appearance. She is obese.  ?HENT:  ?   Head: Normocephalic and atraumatic.  ?Cardiovascular:  ?   Rate and Rhythm: Normal rate and regular rhythm.  ?Pulmonary:  ?   Effort: Pulmonary effort is normal.  ?   Breath sounds: Normal breath sounds. No wheezing, rhonchi or rales.  ?Neurological:  ?   Mental Status: She is alert.  ?Psychiatric:     ?  Mood and Affect: Mood normal.     ?   Behavior: Behavior normal.  ? ? ?Lab Results  ?Component Value Date  ? WBC 4.2 08/06/2021  ? HGB 14.3 08/06/2021  ? HCT 42.9 08/06/2021  ? PLT 203 08/06/2021  ? GLUCOSE 90 08/06/2021  ? CHOL 224 (H) 03/23/2020  ? TRIG 83 03/23/2020  ? HDL 70 03/23/2020  ? LDLCALC 136 (H) 03/23/2020  ? ALT 24 08/06/2021  ? AST 28 08/06/2021  ? NA 142 08/06/2021  ? K 4.7 08/06/2021  ? CL 106 08/06/2021  ? CREATININE 0.90 08/06/2021  ? BUN 13 08/06/2021  ? CO2 29 08/06/2021  ? TSH 0.99 03/23/2020  ? INR 0.92 07/10/2013  ? HGBA1C 5.8 (H) 03/23/2020  ? ? ? ?Assessment & Plan:  ? ?Problem List Items Addressed This Visit   ? ?  ? Cardiovascular and Mediastinum  ? Migraine  ?  Chronic.  Worsening.  Headaches now occurring very frequently requiring frequent use of Imitrex. ?Imitrex as needed.  Starting on prophylactic medication.  Rx for Nurtec  sent. ? ? ?  ?  ? Relevant Medications  ? Rimegepant Sulfate (NURTEC) 75 MG TBDP  ? SUMAtriptan (IMITREX) 50 MG tablet  ? ? ?Meds ordered this encounter  ?Medications  ? Rimegepant Sulfate (NURTEC) 75 MG TBDP  ?  Sig: Take 75 mg by mouth every other day.  ?  Dispense:  45 tablet  ?  Refill:  1  ? SUMAtriptan (IMITREX) 50 MG tablet  ?  Sig: May repeat in 2 hours if headache persists or recurs.  ?  Dispense:  20 tablet  ?  Refill:  6  ? ? ?Thersa Salt DO ?[place] ? ?

## 2021-08-28 NOTE — Telephone Encounter (Signed)
Patient had appointment 08/28/2021 ?

## 2021-08-29 ENCOUNTER — Other Ambulatory Visit: Payer: Self-pay | Admitting: Family Medicine

## 2021-08-29 NOTE — Telephone Encounter (Signed)
Pt contacted. Pt states that she will stick with what she has at this time.  ?

## 2021-09-07 ENCOUNTER — Telehealth: Payer: Self-pay | Admitting: Family Medicine

## 2021-09-07 NOTE — Telephone Encounter (Signed)
Patient is requesting something for her headaches to be called in. She states was you prescribe at her last visit insurance would not cover it. She states having headaches every day now. [place] ?

## 2021-09-08 ENCOUNTER — Other Ambulatory Visit: Payer: Self-pay | Admitting: Family Medicine

## 2021-09-10 ENCOUNTER — Other Ambulatory Visit: Payer: Self-pay | Admitting: Family Medicine

## 2021-09-10 MED ORDER — PROPRANOLOL HCL 20 MG PO TABS: 20.0000 mg | ORAL_TABLET | Freq: Two times a day (BID) | ORAL | 1 refills | Status: DC

## 2021-09-10 NOTE — Telephone Encounter (Signed)
Coral Spikes, DO   ? ?Please advised patient that I would like to try a low dose beta blocker daily to help prevent headaches.    ? ?

## 2021-09-10 NOTE — Telephone Encounter (Signed)
Coral Spikes, DO   ? ?Rx sent.   ? ?

## 2021-09-10 NOTE — Telephone Encounter (Signed)
Patient notified and would like the script sent to [facility] ?

## 2021-09-24 ENCOUNTER — Ambulatory Visit (HOSPITAL_COMMUNITY)
Admission: RE | Admit: 2021-09-24 | Discharge: 2021-09-24 | Disposition: A | Discharge status: Home / Self Care | Payer: No Typology Code available for payment source | Source: Ambulatory Visit | Attending: Internal Medicine | Admitting: Internal Medicine

## 2021-09-24 ENCOUNTER — Ambulatory Visit (HOSPITAL_BASED_OUTPATIENT_CLINIC_OR_DEPARTMENT_OTHER): Payer: No Typology Code available for payment source | Admitting: Anesthesiology

## 2021-09-24 ENCOUNTER — Encounter (HOSPITAL_COMMUNITY): Payer: Self-pay | Admitting: Internal Medicine

## 2021-09-24 ENCOUNTER — Ambulatory Visit (HOSPITAL_COMMUNITY): Payer: No Typology Code available for payment source | Admitting: Anesthesiology

## 2021-09-24 ENCOUNTER — Encounter (HOSPITAL_COMMUNITY)
Admission: RE | Disposition: A | Discharge status: Home / Self Care | Payer: Self-pay | Source: Ambulatory Visit | Attending: Internal Medicine

## 2021-09-24 ENCOUNTER — Other Ambulatory Visit: Payer: Self-pay

## 2021-09-24 DIAGNOSIS — K449 Diaphragmatic hernia without obstruction or gangrene: Secondary | ICD-10-CM | POA: Diagnosis not present

## 2021-09-24 DIAGNOSIS — K219 Gastro-esophageal reflux disease without esophagitis: Secondary | ICD-10-CM | POA: Diagnosis not present

## 2021-09-24 DIAGNOSIS — R1013 Epigastric pain: Secondary | ICD-10-CM | POA: Diagnosis present

## 2021-09-24 DIAGNOSIS — Z87891 Personal history of nicotine dependence: Secondary | ICD-10-CM | POA: Diagnosis not present

## 2021-09-24 DIAGNOSIS — D649 Anemia, unspecified: Secondary | ICD-10-CM | POA: Diagnosis not present

## 2021-09-24 DIAGNOSIS — Z79899 Other long term (current) drug therapy: Secondary | ICD-10-CM | POA: Insufficient documentation

## 2021-09-24 DIAGNOSIS — K900 Celiac disease: Secondary | ICD-10-CM

## 2021-09-24 DIAGNOSIS — R519 Headache, unspecified: Secondary | ICD-10-CM | POA: Diagnosis not present

## 2021-09-24 DIAGNOSIS — D509 Iron deficiency anemia, unspecified: Secondary | ICD-10-CM

## 2021-09-24 DIAGNOSIS — M8580 Other specified disorders of bone density and structure, unspecified site: Secondary | ICD-10-CM

## 2021-09-24 DIAGNOSIS — G43909 Migraine, unspecified, not intractable, without status migrainosus: Secondary | ICD-10-CM

## 2021-09-24 DIAGNOSIS — R7303 Prediabetes: Secondary | ICD-10-CM

## 2021-09-24 DIAGNOSIS — E785 Hyperlipidemia, unspecified: Secondary | ICD-10-CM

## 2021-09-24 DIAGNOSIS — M069 Rheumatoid arthritis, unspecified: Secondary | ICD-10-CM

## 2021-09-24 HISTORY — PX: ESOPHAGOGASTRODUODENOSCOPY (EGD) WITH PROPOFOL: SHX5813

## 2021-09-24 SURGERY — ESOPHAGOGASTRODUODENOSCOPY (EGD) WITH PROPOFOL
Anesthesia: General

## 2021-09-24 MED ORDER — EPHEDRINE SULFATE (PRESSORS) 50 MG/ML IJ SOLN
INTRAMUSCULAR | Status: DC | PRN
  Administered 2021-09-24: 10 mg via INTRAVENOUS

## 2021-09-24 MED ORDER — LIDOCAINE HCL (CARDIAC) PF 50 MG/5ML IV SOSY
PREFILLED_SYRINGE | INTRAVENOUS | Status: DC | PRN
Start: 2021-09-24 — End: 2021-09-24
  Administered 2021-09-24: 50 mg via INTRAVENOUS

## 2021-09-24 MED ORDER — PROPOFOL 10 MG/ML IV BOLUS
INTRAVENOUS | Status: DC | PRN
  Administered 2021-09-24: 40 mg via INTRAVENOUS
  Administered 2021-09-24: 20 mg via INTRAVENOUS
  Administered 2021-09-24: 150 mg via INTRAVENOUS

## 2021-09-24 MED ORDER — LACTATED RINGERS IV SOLN: INTRAVENOUS | Status: DC

## 2021-09-24 NOTE — Discharge Instructions (Signed)
EGD ?Discharge instructions ?Please read the instructions outlined below and refer to this sheet in the next few weeks. These discharge instructions provide you with general information on caring for yourself after you leave the hospital. Your doctor may also give you specific instructions. While your treatment has been planned according to the most current medical practices available, unavoidable complications occasionally occur. If you have any problems or questions after discharge, please call your doctor. ?ACTIVITY ?You may resume your regular activity but move at a slower pace for the next 24 hours.  ?Take frequent rest periods for the next 24 hours.  ?Walking will help expel (get rid of) the air and reduce the bloated feeling in your abdomen.  ?No driving for 24 hours (because of the anesthesia (medicine) used during the test).  ?You may shower.  ?Do not sign any important legal documents or operate any machinery for 24 hours (because of the anesthesia used during the test).  ?NUTRITION ?Drink plenty of fluids.  ?You may resume your normal diet.  ?Begin with a light meal and progress to your normal diet.  ?Avoid alcoholic beverages for 24 hours or as instructed by your caregiver.  ?MEDICATIONS ?You may resume your normal medications unless your caregiver tells you otherwise.  ?WHAT YOU CAN EXPECT TODAY ?You may experience abdominal discomfort such as a feeling of fullness or ?gas? pains.  ?FOLLOW-UP ?Your doctor will discuss the results of your test with you.  ?SEEK IMMEDIATE MEDICAL ATTENTION IF ANY OF THE FOLLOWING OCCUR: ?Excessive nausea (feeling sick to your stomach) and/or vomiting.  ?Severe abdominal pain and distention (swelling).  ?Trouble swallowing.  ?Temperature over 101? F (37.8? C).  ?Rectal bleeding or vomiting of blood.   ? ? ?You do have a large hiatal hernia.  Hernia was only significant finding. ? ?I suspect your hernia may be contributing to your symptoms ? ?We should get an upper GI series  to further characterize the size and type of hernia. ? ?I am not convinced your gallbladder has anything to do with your symptoms ? ?I do recommend we go ahead and make a referral for you to see Dr. Blake Divine for consideration of repair of hiatal hernia.  Possible cholecystectomy at the same time ? ?Further recommendations to follow. ? ?At patient request, I called Versa Craton at [phone] -reviewed findings and recommendations ?

## 2021-09-24 NOTE — H&P (Signed)
@LOGO @ ? ? ?Primary Care Physician:  Coral Spikes, DO ?Primary Gastroenterologist:  Dr. Gala Romney ? ?Pre-Procedure History & Physical: ?HPI:  Rhonda Harris is a 61 y.o. female here for further evaluation of epigastric pain that occurs intermittently before she finishes a meal may radiate up into her left shoulder typically does that.  There is no exertional component.  May last a couple hours to several hours and it just goes away.  She is not afraid to eat.  Most the time she eats and has no trouble.  For instance, she says she has had 3 episodes in the past 2 weeks.  No dysphagia.  Reflux well controlled on twice daily Protonix. ? ?Past Medical History:  ?Diagnosis Date  ? Celiac disease March 2015  ? Esophageal erosions   ? GERD (gastroesophageal reflux disease)   ? ? ?Past Surgical History:  ?Procedure Laterality Date  ? BIOPSY  02/04/2018  ? Procedure: BIOPSY;  Surgeon: Daneil Dolin, MD;  Location: [facility];  Service: Endoscopy;;  duodenal bx's  ? BIOPSY  02/25/2018  ? Procedure: BIOPSY;  Surgeon: Daneil Dolin, MD;  Location: [facility];  Service: Endoscopy;;  ileum  ? COLONOSCOPY N/A 02/25/2018  ? Dr. Gala Romney: Diverticulosis, 5 cm of the terminal ileum appeared eroded and slightly cobblestoned.  Ileal biopsy with prominent lymphoid aggregates, negative for dysplasia.  ? COLONOSCOPY WITH ESOPHAGOGASTRODUODENOSCOPY (EGD) N/A 07/12/2013  ? Dr. Gala Romney: EGD revealed erosive reflux esophagitis, large hiatal hernia, antral erosions, abnormal duodenum with path revealing partially developed celiac sprue. Celiac serologies normal but felt to be dealing with celiac disease. Colonoscopy with ileal erosions, negative path.   ? ESOPHAGOGASTRODUODENOSCOPY N/A 02/04/2018  ? Dr. Gala Romney: Nonobstructing Schatzki ring status post dilation.  Large hiatal hernia.  Scattered mild mucosal changes in the second portion of the duodenum, slightly frondy but no scalloping.  Small bowel biopsy with mild hyperemia and edema, no  features of sprue, active inflammation or granulomas.  Of note, patient was on a gluten-free diet for 4 years prior to this biopsy.  ? GIVENS CAPSULE STUDY N/A 03/23/2018  ? Few erosions/?  Ulcerations noted.  Abnormal villi towards distal small intestines of uncertain significance.  Prominent fold versus benign-appearing polyp with erosion noted at 2 hours 37 minutes 24 seconds.  ? MALONEY DILATION N/A 02/04/2018  ? Procedure: MALONEY DILATION;  Surgeon: Daneil Dolin, MD;  Location: [facility];  Service: Endoscopy;  Laterality: N/A;  ? TUBAL LIGATION    ? ? ?Prior to Admission medications   ?Medication Sig Start Date End Date Taking? Authorizing Provider  ?Calcium Carb-Cholecalciferol (CALCIUM + VITAMIN D3 PO) Take 1 tablet by mouth daily.   Yes [provider]  ?calcium carbonate (OS-CAL) 600 MG TABS tablet Take 600 mg by mouth daily.   Yes [provider]  ?ferrous sulfate 325 (65 FE) MG tablet Take 325 mg by mouth daily with breakfast.   Yes [provider]  ?hydroxychloroquine (PLAQUENIL) 200 MG tablet Take 200 mg by mouth 2 (two) times daily. 08/22/20  Yes [provider]  ?Multiple Vitamin (MULTIVITAMIN) tablet Take 2 tablets by mouth daily.    Yes [provider]  ?mupirocin ointment (BACTROBAN) 2 % Apply 1 application topically 2 (two) times daily. 06/27/21  Yes Volney American, PA-C  ?nabumetone (RELAFEN) 500 MG tablet Take 500 mg by mouth 2 (two) times daily. 08/22/20  Yes [provider]  ?pantoprazole (PROTONIX) 40 MG tablet TAKE ONE TABLET (40  MG TOTAL) BY MOUTH ONE TO TWO TIMES DAILY BEFORE A MEAL FORREFLUX. ?Patient taking differently: Take 40 mg by mouth 2 (two) times daily as needed (acid reflux). 08/28/21  Yes Sherron Monday, NP  ?propranolol (INDERAL) 20 MG tablet Take 1 tablet (20 mg total) by mouth 2 (two) times daily. 09/10/21  Yes Coral Spikes, DO  ?SUMAtriptan (IMITREX) 50 MG tablet May repeat in 2 hours if headache persists or  recurs. ?Patient taking differently: Take 50 mg by mouth every 2 (two) hours as needed for migraine. May repeat in 2 hours if headache persists or recurs. 08/28/21  Yes Coral Spikes, DO  ? ? ?Allergies as of 08/14/2021  ? (No Known Allergies)  ? ? ?Family History  ?Problem Relation Age of Onset  ? Osteoporosis Mother   ? Cancer Father 13  ?     pancreatic  ? Colon cancer Neg Hx   ? Gastric cancer Neg Hx   ? Cancer - Other Neg Hx   ?     small bowel  ? ? ?Social History  ? ?Socioeconomic History  ? Marital status: Married  ?  Spouse name: Not on file  ? Number of children: Not on file  ? Years of education: Not on file  ? Highest education level: Not on file  ?Occupational History  ? Not on file  ?Tobacco Use  ? Smoking status: Former  ?  Types: Cigarettes  ?  Quit date: 07/02/1993  ?  Years since quitting: 28.2  ?  Passive exposure: Never  ? Smokeless tobacco: Never  ? Tobacco comments:  ?  Smokes ciggs some days when stressed at work  ?Vaping Use  ? Vaping Use: Never used  ?Substance and Sexual Activity  ? Alcohol use: Not Currently  ?  Comment: rare social   ? Drug use: No  ? Sexual activity: Yes  ?  Birth control/protection: Post-menopausal, Surgical  ?Other Topics Concern  ? Not on file  ?Social History Narrative  ? Not on file  ? ?Social Determinants of Health  ? ?Financial Resource Strain: Not on file  ?Food Insecurity: Not on file  ?Transportation Needs: Not on file  ?Physical Activity: Not on file  ?Stress: Not on file  ?Social Connections: Not on file  ?Intimate Partner Violence: Not on file  ? ? ?Review of Systems: ?See HPI, otherwise negative ROS ? ?Physical Exam: ?BP 130/77   Pulse 73   Temp 97.8 ?F (36.6 ?C) (Oral)   Resp 12   Ht 5' 6"  (1.676 m)   Wt 104.3 kg   SpO2 96%   BMI 37.12 kg/m?  ?General:   Alert,  Well-developed, well-nourished, pleasant and cooperative in NAD ?Mouth:  No deformity or lesions. ?Neck:  Supple; no masses or thyromegaly. No significant cervical adenopathy. ?Lungs:  Clear  throughout to auscultation.   No wheezes, crackles, or rhonchi. No acute distress. ?Heart:  Regular rate and rhythm; no murmurs, clicks, rubs,  or gallops. ?Abdomen: Non-distended, normal bowel sounds.  Soft and nontender without appreciable mass or hepatosplenomegaly.  ?Pulses:  Normal pulses noted. ?Extremities:  Without clubbing or edema. ? ?Impression/Plan: 61 year old lady with epigastric pain radiating into her left shoulder; occurs only in the midst of consuming a meal on an intermittent basis.  Always radiates into her left shoulder may last 1 to 2 hours to several hours and goes away.  She is asymptomatic most of the time. ?No weight loss.  There is no exertional component.  Symptoms not consistent with gut ischemia.  Moreover, not suggestive of cardiac etiology.  Large gallstone in her gallbladder seen on ultrasound.  She has a known large hiatal hernia. ?Symptom onset before she finishes a meal-query mechanical phenomenon. ? ?Recommendations: ? ?I have offered the patient a diagnostic colonoscopy today.  Risk benefits limitations alternatives have been reviewed.  Questions answered.  Patient is agreeable.  If EGD is unrevealing, would consider general surgery consultation. ? ?Further recommendations to follow. ? ? ?Notice: This dictation was prepared with Dragon dictation along with smaller phrase technology. Any transcriptional errors that result from this process are unintentional and may not be corrected upon review.  ? ?

## 2021-09-24 NOTE — Anesthesia Postprocedure Evaluation (Signed)
Anesthesia Post Note ? ?Patient: Rhonda Harris ? ?Procedure(s) Performed: ESOPHAGOGASTRODUODENOSCOPY (EGD) WITH PROPOFOL ? ?Patient location during evaluation: Endoscopy ?Anesthesia Type: General ?Level of consciousness: awake and alert and oriented ?Pain management: pain level controlled ?Vital Signs Assessment: post-procedure vital signs reviewed and stable ?Respiratory status: spontaneous breathing, nonlabored ventilation and respiratory function stable ?Cardiovascular status: blood pressure returned to baseline and stable ?Postop Assessment: no apparent nausea or vomiting ?Anesthetic complications: no ? ? ?No notable events documented. ? ? ?Last Vitals:  ?Vitals:  ? 09/24/21 0821 09/24/21 1016  ?BP: 130/77 (!) 92/53  ?Pulse: 73   ?Resp: 12 14  ?Temp: 36.6 ?C (!) 36.4 ?C  ?SpO2: 96% 95%  ?  ?Last Pain:  ?Vitals:  ? 09/24/21 1016  ?TempSrc: Oral  ?PainSc: 0-No pain  ? ? ?  ?  ?  ?  ?  ?  ? ?Niam Nepomuceno C Rhonda Harris ? ? ? ? ?

## 2021-09-24 NOTE — Op Note (Signed)
[facility] ?Patient Name: Rhonda Harris ?Procedure Date: 09/24/2021 9:37 AM ?MRN: [id number] ?Date of Birth: [birth year] ?Attending MD: Gennette Pac , MD ?CSN: [id number] ?Age: 61 ?Admit Type: Outpatient ?Procedure:                Upper GI endoscopy ?Indications:              Epigastric abdominal pain ?Providers:                Gennette Pac, MD, Sheran Fava, Marylene Land  ?                          Jim Desanctis RN, RN ?Referring MD:              ?Medicines:                Propofol per Anesthesia ?Complications:            No immediate complications. ?Estimated Blood Loss:     Estimated blood loss: none. ?Procedure:                Pre-Anesthesia Assessment: ?                          - Prior to the procedure, a History and Physical  ?                          was performed, and patient medications and  ?                          allergies were reviewed. The patient's tolerance of  ?                          previous anesthesia was also reviewed. The risks  ?                          and benefits of the procedure and the sedation  ?                          options and risks were discussed with the patient.  ?                          All questions were answered, and informed consent  ?                          was obtained. Prior Anticoagulants: The patient has  ?                          taken no previous anticoagulant or antiplatelet  ?                          agents. ASA Grade Assessment: II - A patient with  ?                          mild systemic disease. After reviewing the risks  ?  and benefits, the patient was deemed in  ?                          satisfactory condition to undergo the procedure. ?                          After obtaining informed consent, the endoscope was  ?                          passed under direct vision. Throughout the  ?                          procedure, the patient's blood pressure, pulse, and  ?                          oxygen  saturations were monitored continuously. The  ?                          GIF-H190 ([id number]) scope was introduced through the  ?                          mouth, and advanced to the third part of duodenum.  ?                          The upper GI endoscopy was accomplished without  ?                          difficulty. The upper GI endoscopy was accomplished  ?                          without difficulty. The patient tolerated the  ?                          procedure well. ?Scope In: 10:03:57 AM ?Scope Out: 10:10:19 AM ?Total Procedure Duration: 0 hours 6 minutes 22 seconds  ?Findings: ?     The examined esophagus was normal. ?     A large hiatal hernia was present. Based on location of diaphragmatic  ?     hiatus, I estimate approximately one half of the stomach is above the  ?     diaphragm. Redundant folds and small area of pocketing beside the GE  ?     junction seen retroflexed. Mucosa, however, appeared generally normal.  ?     Pylorus patent. Examination of the first second and third portion of the  ?     duodenum revealed entirely normal appearing mucosa. ?Impression:               - Normal esophagus. ?                          - Large hiatal hernia. Cannot rule out a mixed  ?                          paraesophageal component. ?                          -  Normal duodenal bulb, second portion of the  ?                          duodenum and third portion of the duodenum. ?                          - No specimens collected. Onset of symptoms too  ?                          rapid (occurring during a meal) to be biliary in  ?                          origin. I suspect more of a mechanical phenomenon  ?                          related to her hernia. Symptoms not consistent with  ?                          mesenteric ischemia. ?Moderate Sedation: ?     Moderate (conscious) sedation was personally administered by an  ?     anesthesia professional. The following parameters were monitored: oxygen  ?     saturation,  heart rate, blood pressure, respiratory rate, EKG, adequacy  ?     of pulmonary ventilation, and response to care. ?Recommendation:           - Patient has a contact number available for  ?                          emergencies. The signs and symptoms of potential  ?                          delayed complications were discussed with the  ?                          patient. Return to normal activities tomorrow.  ?                          Written discharge instructions were provided to the  ?                          patient. ?                          - Advance diet as tolerated. Upper GI series upper  ?                          GI series to further evaluate anatomy. Proceed with  ?                          surgical consultation. Patient wishes to stay  ?                          locally for consultation. Therefore, we will refer  ?  her to Dr. Larae Grooms. ?Procedure Code(s):        --- Professional --- ?                          [phone], Esophagogastroduodenoscopy, flexible,  ?                          transoral; diagnostic, including collection of  ?                          specimen(s) by brushing or washing, when performed  ?                          (separate procedure) ?Diagnosis Code(s):        --- Professional --- ?                          K44.9, Diaphragmatic hernia without obstruction or  ?                          gangrene ?                          R10.13, Epigastric pain ?CPT copyright 2019 [employer] Medical Association. All rights reserved. ?The codes documented in this report are preliminary and upon coder review may  ?be revised to meet current compliance requirements. ?Gerrit Friends. Shenelle Klas, MD ?Gennette Pac, MD ?09/24/2021 10:24:53 AM ?This report has been signed electronically. ?Number of Addenda: 0 ?

## 2021-09-24 NOTE — Anesthesia Procedure Notes (Signed)
Date/Time: 09/24/2021 9:58 AM ?Performed by: Vista Deck, CRNA ?Pre-anesthesia Checklist: Patient identified, Emergency Drugs available, Suction available, Timeout performed and Patient being monitored ?Patient Re-evaluated:Patient Re-evaluated prior to induction ?Oxygen Delivery Method: Nasal Cannula ? ? ? ? ?

## 2021-09-24 NOTE — Transfer of Care (Signed)
Immediate Anesthesia Transfer of Care Note ? ?Patient: Rhonda Harris ? ?Procedure(s) Performed: ESOPHAGOGASTRODUODENOSCOPY (EGD) WITH PROPOFOL ? ?Patient Location: Endoscopy Unit ? ?Anesthesia Type:General ? ?Level of Consciousness: awake and patient cooperative ? ?Airway & Oxygen Therapy: Patient Spontanous Breathing ? ?Post-op Assessment: Report given to RN and Post -op Vital signs reviewed and stable ? ?Post vital signs: Reviewed and stable ? ?Last Vitals:  ?Vitals Value Taken Time  ?BP 92.53 1016  ?Temp 97.5 1016  ?Pulse 70 1016  ?Resp 14 1016  ?SpO2 95 1016  ? ? ?Last Pain:  ?Vitals:  ? 09/24/21 1001  ?TempSrc:   ?PainSc: 0-No pain  ?   ? ?  ? ?Complications: No notable events documented. ?

## 2021-09-24 NOTE — Anesthesia Preprocedure Evaluation (Addendum)
Anesthesia Evaluation  ?Patient identified by MRN, date of birth, ID band ?Patient awake ? ? ? ?Reviewed: ?Allergy & Precautions, NPO status , Patient's Chart, lab work & pertinent test results, reviewed documented beta blocker date and time  ? ?Airway ?Mallampati: II ? ?TM Distance: >3 FB ?Neck ROM: Full ? ? ? Dental ? ?(+) Dental Advisory Given, Chipped, Missing ?Left lower broken molar tooth:   ?Pulmonary ?neg pulmonary ROS, former smoker,  ?  ?Pulmonary exam normal ?breath sounds clear to auscultation ? ? ? ? ? ? Cardiovascular ?Pt. on home beta blockers ?Normal cardiovascular exam ?Rhythm:Regular Rate:Normal ? ? ?  ?Neuro/Psych ? Headaches,  Neuromuscular disease negative psych ROS  ? GI/Hepatic ?Neg liver ROS, hiatal hernia, GERD  Medicated and Controlled,  ?Endo/Other  ?negative endocrine ROS ? Renal/GU ?negative Renal ROS  ?negative genitourinary ?  ?Musculoskeletal ? ?(+) Arthritis , Rheumatoid disorders,   ? Abdominal ?  ?Peds ?negative pediatric ROS ?(+)  Hematology ? ?(+) Blood dyscrasia, anemia ,   ?Anesthesia Other Findings ? ? Reproductive/Obstetrics ?negative OB ROS ? ?  ? ? ? ? ? ? ? ? ? ? ? ? ? ?  ?  ? ? ? ? ? ? ? ?Anesthesia Physical ?Anesthesia Plan ? ?ASA: 2 ? ?Anesthesia Plan: General  ? ?Post-op Pain Management: Minimal or no pain anticipated  ? ?Induction: Intravenous ? ?PONV Risk Score and Plan: Propofol infusion ? ?Airway Management Planned: Nasal Cannula and Natural Airway ? ?Additional Equipment:  ? ?Intra-op Plan:  ? ?Post-operative Plan:  ? ?Informed Consent: I have reviewed the patients History and Physical, chart, labs and discussed the procedure including the risks, benefits and alternatives for the proposed anesthesia with the patient or authorized representative who has indicated his/her understanding and acceptance.  ? ? ? ?Dental advisory given ? ?Plan Discussed with: CRNA and Surgeon ? ?Anesthesia Plan Comments:   ? ? ? ? ? ?Anesthesia Quick  Evaluation ? ?

## 2021-09-25 ENCOUNTER — Other Ambulatory Visit: Payer: Self-pay

## 2021-09-25 ENCOUNTER — Telehealth: Payer: Self-pay

## 2021-09-25 DIAGNOSIS — K449 Diaphragmatic hernia without obstruction or gangrene: Secondary | ICD-10-CM

## 2021-09-25 DIAGNOSIS — R1013 Epigastric pain: Secondary | ICD-10-CM

## 2021-09-25 NOTE — Telephone Encounter (Signed)
Tammy at [facility] yesterday, Dr. Gala Romney wants pt to have UGI series to check hiatal hernia. ? ?Per AVS for yesterday's procedure pt also needs referral to surgeon (Dr. Constance Haw) for consideration of repair of hiatal hernia. Possible cholecystectomy at the same time.  ? ?UGI series scheduled for 10/02/21 at 9:00am, arrive at 8:30am. NPO after midnight before test. ? ?Called pt, informed her of UGI appt and referral to surgery. ?

## 2021-09-26 ENCOUNTER — Other Ambulatory Visit: Payer: Self-pay

## 2021-09-26 DIAGNOSIS — K449 Diaphragmatic hernia without obstruction or gangrene: Secondary | ICD-10-CM

## 2021-09-26 NOTE — Telephone Encounter (Signed)
Received message from nurse at Dr. Constance Haw office, they don't do surgery for hiatal hernias. Recommended [facility]. ? ?Called and informed pt, she is agreeable to referral being sent to [facility]. ? ?Referral sent to [facility] via [facility]. ?

## 2021-10-01 ENCOUNTER — Encounter (HOSPITAL_COMMUNITY): Payer: Self-pay | Admitting: Internal Medicine

## 2021-10-02 ENCOUNTER — Ambulatory Visit (HOSPITAL_COMMUNITY)
Admission: RE | Admit: 2021-10-02 | Discharge: 2021-10-02 | Disposition: A | Discharge status: Home / Self Care | Payer: No Typology Code available for payment source | Source: Ambulatory Visit | Attending: Internal Medicine | Admitting: Internal Medicine

## 2021-10-02 DIAGNOSIS — K449 Diaphragmatic hernia without obstruction or gangrene: Secondary | ICD-10-CM | POA: Diagnosis present

## 2021-10-18 ENCOUNTER — Ambulatory Visit: Payer: Self-pay | Admitting: Surgery

## 2021-10-18 NOTE — H&P (Signed)
Expand All Collapse All      Rhonda Harris [id number]    Referring Provider:  Karie Soda, RN     Subjective    Chief Complaint: Hernia       History of Present Illness:    61 year old woman with history of GERD, esophageal erosions, celiac disease who was referred by Dr. Sydell Harris for evaluation of a large hiatal hernia.  This was confirmed on upper endoscopy last month which shows may be half of her stomach herniated into the chest. She has known about this for at least 8 years, but until a few months ago it was asymptomatic with the exception of mild reflux controlled with PPI.  For the last few months she has had pain in the chest (subxiphoid) radiating to her left shoulder and back that comes on when she eats.  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  She does note some nausea and some relief with occasional emesis.  Denies any abdominal or specifically right upper quadrant pain.  This is happened intermittently, perhaps 1-2 times per week.  Denies any dysphagia.  Reports her reflux remains well controlled on Protonix. In addition to the EGD, she is also had an ultrasound showing a 1.8 cm gallstone without evidence of cholecystitis but hepatic steatosis noted.  Upper GI a couple weeks ago confirms large sliding hiatal hernia with almost the entire stomach in the thoracic space, no definite esophageal mass or stricture, no obstruction, small diverticulum at the D2.       Review of Systems: A complete review of systems was obtained from the patient.  I have reviewed this information and discussed as appropriate with the patient.  See HPI as well for other ROS.     Medical History: Past Medical History      Past Medical History:  Diagnosis Date   Anemia          There is no problem list on file for  this patient.     Past Surgical History       Past Surgical History:  Procedure Laterality Date   ESSURE TUBAL LIGATION            Allergies  No Known Allergies           Current Outpatient Medications on File Prior to Visit  Medication Sig Dispense Refill   hydrOXYchloroQUINE (PLAQUENIL) 200 mg tablet 1 tab       nabumetone (RELAFEN) 500 MG tablet 1 tablet       pantoprazole (PROTONIX) 40 MG DR tablet 1 tablet       propranoloL (INDERAL) 20 MG tablet Take 20 mg by mouth 2 (two) times daily       SUMAtriptan (IMITREX) 50 MG tablet 1 tablet at least 2 hours between doses as needed       calcium carbonate 500 mg calcium (1,250 mg) tablet 1 tablet with meals       cholecalciferol (VITAMIN D3) 2,000 unit capsule 1 capsule       ferrous sulfate 325 (65 FE) MG tablet 1 tablet       multivitamin tablet Take 1 tablet by mouth        No current facility-administered medications on file prior to visit.      Family History  Family History  Problem Relation Age of Onset   Colon cancer Father          Social History       Tobacco Use  Smoking Status Never  Smokeless Tobacco Never      Social History  Social History        Socioeconomic History   Marital status: Married  Tobacco Use   Smoking status: Never   Smokeless tobacco: Never  Vaping Use   Vaping Use: Never used  Substance and Sexual Activity   Alcohol use: Never   Drug use: Never        Objective:         Vitals:    10/18/21 0953  BP: (!) 140/80  Pulse: 70  Temp: 36.9 C (98.4 F)  SpO2: 98%  Weight: (!) 109.6 kg (241 lb 9.6 oz)  Height: 167.6 cm (5' 6" )    Body mass index is 39 kg/m.   Alert and well-appearing Unlabored respirations Abdomen is soft, nontender, nondistended   Assessment and Plan:  Diagnoses and all orders for this visit:   Paraesophageal hiatal hernia     We discussed the relevant anatomy using a diagram to demonstrate, and went over the surgical technique to  repair this.  Discussed the typical perioperative course and we discussed risks of bleeding, infection, pain, scarring, intra-abdominal or mediastinal injury, cardiovascular/pulmonary/thromboembolic complications.  We also discussed possible long-term sequelae including hiatal hernia recurrence, which in her case is higher risk due to obesity and we discussed this.  We discussed risks of dysphagia, gas bloat, persistent symptoms, recurrent reflux, especially if gastropexy is needed/sufficient esophageal length to complete an appropriate wrap, etc. Questions welcomed and answered.  Given her symptoms I do believe it is merited to go forth with repair however I did encourage her to work on weight loss in the interim, I anticipate it will be about a month before her surgery is scheduled in any way that she is able to lose in the interim will help decrease her risk of recurrence.     Rhonda Harris Rhonda James, MD

## 2021-10-21 ENCOUNTER — Ambulatory Visit
Admission: EM | Admit: 2021-10-21 | Discharge: 2021-10-21 | Disposition: A | Discharge status: Home / Self Care | Payer: No Typology Code available for payment source | Attending: Nurse Practitioner | Admitting: Nurse Practitioner

## 2021-10-21 ENCOUNTER — Encounter: Payer: Self-pay | Admitting: Emergency Medicine

## 2021-10-21 DIAGNOSIS — H1033 Unspecified acute conjunctivitis, bilateral: Secondary | ICD-10-CM | POA: Diagnosis not present

## 2021-10-21 MED ORDER — TOBRAMYCIN-DEXAMETHASONE 0.3-0.1 % OP SUSP: 1.0000 [drp] | Freq: Four times a day (QID) | OPHTHALMIC | 0 refills | Status: AC

## 2021-10-21 NOTE — Discharge Instructions (Addendum)
Apply eyedrops as prescribed. Cool compresses to the eyes to help with pain and irritation. Avoid rubbing, scratching, or manipulating the eyes while symptoms persist. Do not wear eye make-up or contacts while symptoms persist. Strict handwashing when using medication or when and after touching the eyes. If your symptoms do not improve after using medication, recommend following up with your eye doctor.  Follow-up as needed.

## 2021-10-21 NOTE — ED Triage Notes (Signed)
Bilateral eye redness since Friday.  States eye itch and hurt.

## 2021-10-21 NOTE — ED Provider Notes (Signed)
[facility]    CSN: [id number] Arrival date & time: 10/21/21  0835      History   Chief Complaint Chief Complaint  Patient presents with   eye redness    HPI Rhonda Harris is a 61 y.o. female.   HPI Patient presents with bilateral eye redness that started 2 days ago.  Symptoms started in the left eye and then progressed to the right eye over 24 hours.  Patient complains of matting, crusting, drainage, itchiness, irritation.  She denies fever, chills, headache, change in vision, blurred vision, loss of vision, new make-ups, or contact use..  Patient admits to having some nasal congestion and postnasal drainage at this time.  She denies any known sick contacts.  Patient wears glasses. Past Medical History:  Diagnosis Date   Celiac disease March 2015   Esophageal erosions    GERD (gastroesophageal reflux disease)     Patient Active Problem List   Diagnosis Date Noted   Abdominal pain, epigastric 07/23/2021   Hyperlipidemia 02/20/2021   GERD (gastroesophageal reflux disease) 02/19/2021   Prediabetes 02/19/2021   Migraine 02/19/2021   Rheumatoid arthritis ([place]) 02/19/2021   IDA (iron deficiency anemia) 05/15/2018   Large hiatal hernia 01/30/2018   Osteopenia 10/31/2014   Celiac disease 08/02/2013    Past Surgical History:  Procedure Laterality Date   BIOPSY  02/04/2018   Procedure: BIOPSY;  Surgeon: Daneil Dolin, MD;  [facility];  Service: Endoscopy;;  duodenal bx's   BIOPSY  02/25/2018   Procedure: BIOPSY;  Surgeon: Daneil Dolin, MD;  [facility]: [facility];  Service: Endoscopy;;  ileum   COLONOSCOPY N/A 02/25/2018   Dr. Gala Romney: Diverticulosis, 5 cm of the terminal ileum appeared eroded and slightly cobblestoned.  Ileal biopsy with prominent lymphoid aggregates, negative for dysplasia.   COLONOSCOPY WITH ESOPHAGOGASTRODUODENOSCOPY (EGD) N/A 07/12/2013   Dr. Gala Romney: EGD revealed erosive reflux esophagitis, large hiatal hernia, antral  erosions, abnormal duodenum with path revealing partially developed celiac sprue. Celiac serologies normal but felt to be dealing with celiac disease. Colonoscopy with ileal erosions, negative path.    ESOPHAGOGASTRODUODENOSCOPY N/A 02/04/2018   Dr. Gala Romney: Nonobstructing Schatzki ring status post dilation.  Large hiatal hernia.  Scattered mild mucosal changes in the second portion of the duodenum, slightly frondy but no scalloping.  Small bowel biopsy with mild hyperemia and edema, no features of sprue, active inflammation or granulomas.  Of note, patient was on a gluten-free diet for 4 years prior to this biopsy.   ESOPHAGOGASTRODUODENOSCOPY (EGD) WITH PROPOFOL N/A 09/24/2021   Procedure: ESOPHAGOGASTRODUODENOSCOPY (EGD) WITH PROPOFOL;  Surgeon: Daneil Dolin, MD;  Location: [facility];  Service: Endoscopy;  Laterality: N/A;  9:30am   GIVENS CAPSULE STUDY N/A 03/23/2018   Few erosions/?  Ulcerations noted.  Abnormal villi towards distal small intestines of uncertain significance.  Prominent fold versus benign-appearing polyp with erosion noted at 2 hours 37 minutes 24 seconds.   MALONEY DILATION N/A 02/04/2018   Procedure: Venia Minks DILATION;  Surgeon: Daneil Dolin, MD;  Location: [facility];  Service: Endoscopy;  Laterality: N/A;   TUBAL LIGATION      OB History   No obstetric history on file.      Home Medications    Prior to Admission medications   Medication Sig Start Date End Date Taking? Authorizing Provider  Calcium Carb-Cholecalciferol (CALCIUM + VITAMIN D3 PO) Take 1 tablet by mouth daily.    [provider]  calcium carbonate (OS-CAL)  600 MG TABS tablet Take 600 mg by mouth daily.    [provider]  ferrous sulfate 325 (65 FE) MG tablet Take 325 mg by mouth daily with breakfast.    [provider]  hydroxychloroquine (PLAQUENIL) 200 MG tablet Take 200 mg by mouth 2 (two) times daily. 08/22/20   [provider]  Multiple Vitamin  (MULTIVITAMIN) tablet Take 2 tablets by mouth daily.     [provider]  mupirocin ointment (BACTROBAN) 2 % Apply 1 application topically 2 (two) times daily. 06/27/21   Volney American, PA-C  nabumetone (RELAFEN) 500 MG tablet Take 500 mg by mouth 2 (two) times daily. 08/22/20   [provider]  pantoprazole (PROTONIX) 40 MG tablet TAKE ONE TABLET (40 MG TOTAL) BY MOUTH ONE TO TWO TIMES DAILY BEFORE A MEAL FORREFLUX. Patient taking differently: Take 40 mg by mouth 2 (two) times daily as needed (acid reflux). 08/28/21   Sherron Monday, NP  propranolol (INDERAL) 20 MG tablet Take 1 tablet (20 mg total) by mouth 2 (two) times daily. 09/10/21   Coral Spikes, DO  SUMAtriptan (IMITREX) 50 MG tablet May repeat in 2 hours if headache persists or recurs. Patient taking differently: Take 50 mg by mouth every 2 (two) hours as needed for migraine. May repeat in 2 hours if headache persists or recurs. 08/28/21   Coral Spikes, DO    Family History Family History  Problem Relation Age of Onset   Osteoporosis Mother    Cancer Father 70       pancreatic   Colon cancer Neg Hx    Gastric cancer Neg Hx    Cancer - Other Neg Hx        small bowel    Social History Social History   Tobacco Use   Smoking status: Former    Types: Cigarettes    Quit date: 07/02/1993    Years since quitting: 28.3    Passive exposure: Never   Smokeless tobacco: Never   Tobacco comments:    Smokes ciggs some days when stressed at work  Scientific laboratory technician Use: Never used  Substance Use Topics   Alcohol use: Not Currently    Comment: rare social    Drug use: No     Allergies   Patient has no known allergies.   Review of Systems Review of Systems Per HPI  Physical Exam Triage Vital Signs ED Triage Vitals  Enc Vitals Group     BP 10/21/21 0842 137/85     Pulse Rate 10/21/21 0842 70     Resp 10/21/21 0842 18     Temp 10/21/21 0842 97.8 F (36.6 C)     Temp Source 10/21/21 0842  Oral     SpO2 10/21/21 0842 98 %     Weight --      Height --      Head Circumference --      Peak Flow --      Pain Score 10/21/21 0844 6     Pain Loc --      Pain Edu? --      Excl. in [place]? --    No data found.  Updated Vital Signs BP 137/85 (BP Location: Right Arm)   Pulse 70   Temp 97.8 F (36.6 C) (Oral)   Resp 18   SpO2 98%   Visual Acuity Right Eye Distance:   Left Eye Distance:   Bilateral Distance:    Right  Eye Near:   Left Eye Near:    Bilateral Near:     Physical Exam Vitals and nursing note reviewed.  Constitutional:      General: She is not in acute distress.    Appearance: Normal appearance. She is well-developed.  HENT:     Head: Normocephalic and atraumatic.     Right Ear: Tympanic membrane, ear canal and external ear normal.     Left Ear: Tympanic membrane, ear canal and external ear normal.     Nose: Congestion present.     Mouth/Throat:     Pharynx: Posterior oropharyngeal erythema present. No oropharyngeal exudate.  Eyes:     General: Lids are normal. No visual field deficit or scleral icterus.       Right eye: No foreign body, discharge or hordeolum.        Left eye: No foreign body, discharge or hordeolum.     Extraocular Movements:     Right eye: Normal extraocular motion and no nystagmus.     Left eye: Normal extraocular motion and no nystagmus.     Conjunctiva/sclera:     Right eye: Right conjunctiva is injected. Exudate present. No hemorrhage.    Left eye: Left conjunctiva is injected. Exudate present. No hemorrhage.    Pupils: Pupils are equal, round, and reactive to light.  Neck:     Thyroid: No thyromegaly.     Trachea: No tracheal deviation.  Cardiovascular:     Rate and Rhythm: Normal rate and regular rhythm.     Heart sounds: Normal heart sounds.  Pulmonary:     Effort: Pulmonary effort is normal.     Breath sounds: Normal breath sounds.  Abdominal:     General: Bowel sounds are normal. There is no distension.      Palpations: Abdomen is soft.     Tenderness: There is no abdominal tenderness.  Musculoskeletal:     Cervical back: Normal range of motion and neck supple.  Skin:    General: Skin is warm and dry.  Neurological:     Mental Status: She is alert and oriented to person, place, and time.  Psychiatric:        Behavior: Behavior normal.        Thought Content: Thought content normal.        Judgment: Judgment normal.      UC Treatments / Results  Labs (all labs ordered are listed, but only abnormal results are displayed) Labs Reviewed - No data to display  EKG   Radiology No results found.  Procedures Procedures (including critical care time)  Medications Ordered in UC Medications - No data to display  Initial Impression / Assessment and Plan / UC Course  I have reviewed the triage vital signs and the nursing notes.  Pertinent labs & imaging results that were available during my care of the patient were reviewed by me and considered in my medical decision making (see chart for details).  Presents with bilateral eye redness, drainage, and irritation has been present for the past 2 days.  Will treat patient with TobraDex eyedrops to help with inflammation and for antibiotic coverage.  Supportive care recommendations were provided to the patient.  Patient advised that if her symptoms do not improve after treatment of antibiotic, she will need to follow-up with her eye doctor. Otherwise follow-up as needed. Final Clinical Impressions(s) / UC Diagnoses   Final diagnoses:  None   Discharge Instructions   None    ED Prescriptions  None    PDMP not reviewed this encounter.   Tish Men, NP 10/21/21 [phone]

## 2021-10-24 ENCOUNTER — Other Ambulatory Visit: Payer: Self-pay | Admitting: Family Medicine

## 2021-11-20 ENCOUNTER — Encounter (HOSPITAL_COMMUNITY): Payer: No Typology Code available for payment source

## 2021-11-21 NOTE — Progress Notes (Signed)
COVID Vaccine Completed:  Date of COVID positive in last 90 days:  PCP - Thersa Salt, DO Cardiologist -   Chest x-ray -  EKG -  Stress Test -  ECHO -  Cardiac Cath -  Pacemaker/ICD device last checked: Spinal Cord Stimulator:  Bowel Prep -   Sleep Study -  CPAP -   Fasting Blood Sugar -  Checks Blood Sugar _____ times a day  Blood Thinner Instructions: Aspirin Instructions: Last Dose:  Activity level:  Can go up a flight of stairs and perform activities of daily living without stopping and without symptoms of chest pain or shortness of breath.  Able to exercise without symptoms  Unable to go up a flight of stairs without symptoms of     Anesthesia review:   Patient denies shortness of breath, fever, cough and chest pain at PAT appointment  Patient verbalized understanding of instructions that were given to them at the PAT appointment. Patient was also instructed that they will need to review over the PAT instructions again at home before surgery.

## 2021-11-21 NOTE — Patient Instructions (Signed)
DUE TO COVID-19 ONLY TWO VISITORS  (aged 59 and older)  ARE ALLOWED TO COME WITH YOU AND STAY IN THE WAITING ROOM ONLY DURING PRE OP AND PROCEDURE.   **NO VISITORS ARE ALLOWED IN THE SHORT STAY AREA OR RECOVERY ROOM!!**  IF YOU WILL BE ADMITTED INTO THE HOSPITAL YOU ARE ALLOWED ONLY FOUR SUPPORT PEOPLE DURING VISITATION HOURS ONLY (7 AM -8PM)   The support person(s) must pass our screening, gel in and out, and wear a mask at all times, including in the patient's room. Patients must also wear a mask when staff or their support person are in the room. Visitors GUEST BADGE MUST BE WORN VISIBLY  One adult visitor may remain with you overnight and MUST be in the room by 8 P.M.     Your procedure is scheduled on: 12/03/21   Report to [facility] Main Entrance    Report to admitting at 11:00 AM   Call this number if you have problems the morning of surgery [phone]   Do not eat food :After Midnight.   After Midnight you may have the following liquids until 10:15 AM DAY OF SURGERY  Water Black Coffee (sugar ok, NO MILK/CREAM OR CREAMERS)  Tea (sugar ok, NO MILK/CREAM OR CREAMERS) regular and decaf                             Plain Jell-O (NO RED)                                           Fruit ices (not with fruit pulp, NO RED)                                     Popsicles (NO RED)                                                                  Juice: apple, WHITE grape, WHITE cranberry Sports drinks like Gatorade (NO RED) Clear broth(vegetable,chicken,beef)  FOLLOW BOWEL PREP AND ANY ADDITIONAL PRE OP INSTRUCTIONS YOU RECEIVED FROM YOUR SURGEON'S OFFICE!!!     Oral Hygiene is also important to reduce your risk of infection.                                    Remember - BRUSH YOUR TEETH THE MORNING OF SURGERY WITH YOUR REGULAR TOOTHPASTE   Take these medicines the morning of surgery with A SIP OF WATER: Pantoprazole, Propranolol                               You may not  have any metal on your body including hair pins, jewelry, and body piercing             Do not wear make-up, lotions, powders, perfumes, or deodorant  Do not wear nail polish including gel and S&S, artificial/acrylic nails, or any other type of  covering on natural nails including finger and toenails. If you have artificial nails, gel coating, etc. that needs to be removed by a nail salon please have this removed prior to surgery or surgery may need to be canceled/ delayed if the surgeon/ anesthesia feels like they are unable to be safely monitored.   Do not shave  48 hours prior to surgery.    Do not bring valuables to the hospital. [place].   Bring small overnight bag day of surgery.   DO NOT [place]. PHARMACY WILL DISPENSE MEDICATIONS LISTED ON YOUR MEDICATION LIST TO YOU DURING YOUR ADMISSION [place]!              Please read over the following fact sheets you were given: IF YOU HAVE QUESTIONS ABOUT YOUR PRE-OP INSTRUCTIONS PLEASE CALL [facility] - Preparing for Surgery Before surgery, you can play an important role.  Because skin is not sterile, your skin needs to be as free of germs as possible.  You can reduce the number of germs on your skin by washing with CHG (chlorahexidine gluconate) soap before surgery.  CHG is an antiseptic cleaner which kills germs and bonds with the skin to continue killing germs even after washing. Please DO NOT use if you have an allergy to CHG or antibacterial soaps.  If your skin becomes reddened/irritated stop using the CHG and inform your nurse when you arrive at Short Stay. Do not shave (including legs and underarms) for at least 48 hours prior to the first CHG shower.  You may shave your face/neck.  Please follow these instructions carefully:  1.  Shower with CHG Soap the night before surgery and the  morning of surgery.  2.  If you  choose to wash your hair, wash your hair first as usual with your normal  shampoo.  3.  After you shampoo, rinse your hair and body thoroughly to remove the shampoo.                             4.  Use CHG as you would any other liquid soap.  You can apply chg directly to the skin and wash.  Gently with a scrungie or clean washcloth.  5.  Apply the CHG Soap to your body ONLY FROM THE NECK DOWN.   Do   not use on face/ open                           Wound or open sores. Avoid contact with eyes, ears mouth and   genitals (private parts).                       Wash face,  Genitals (private parts) with your normal soap.             6.  Wash thoroughly, paying special attention to the area where your    surgery  will be performed.  7.  Thoroughly rinse your body with warm water from the neck down.  8.  DO NOT shower/wash with your normal soap after using and rinsing off the CHG Soap.                9.  Pat yourself dry with a clean towel.            10.  Wear clean pajamas.            11.  Place clean sheets on your bed the night of your first shower and do not  sleep with pets. Day of Surgery : Do not apply any lotions/deodorants the morning of surgery.  Please wear clean clothes to the hospital/surgery center.  FAILURE TO FOLLOW THESE INSTRUCTIONS MAY RESULT IN THE CANCELLATION OF YOUR SURGERY  PATIENT SIGNATURE_________________________________  NURSE SIGNATURE__________________________________  ________________________________________________________________________

## 2021-11-22 ENCOUNTER — Encounter (HOSPITAL_COMMUNITY): Payer: Self-pay

## 2021-11-22 ENCOUNTER — Encounter (HOSPITAL_COMMUNITY)
Admission: RE | Admit: 2021-11-22 | Discharge: 2021-11-22 | Disposition: A | Discharge status: Home / Self Care | Payer: No Typology Code available for payment source | Source: Ambulatory Visit | Attending: Surgery | Admitting: Surgery

## 2021-11-22 VITALS — BP 135/84 | HR 70 | Temp 97.7°F | Resp 14 | Ht 66.0 in | Wt 238.4 lb

## 2021-11-22 DIAGNOSIS — R7303 Prediabetes: Secondary | ICD-10-CM | POA: Insufficient documentation

## 2021-11-22 DIAGNOSIS — Z01812 Encounter for preprocedural laboratory examination: Secondary | ICD-10-CM | POA: Diagnosis present

## 2021-11-22 HISTORY — DX: Headache, unspecified: R51.9

## 2021-11-22 HISTORY — DX: Unspecified osteoarthritis, unspecified site: M19.90

## 2021-11-22 HISTORY — DX: Personal history of urinary calculi: Z87.442

## 2021-11-22 LAB — HEMOGLOBIN A1C
Hgb A1c MFr Bld: 5.6 % (ref 4.8–5.6)
Mean Plasma Glucose: 114.02 mg/dL

## 2021-11-22 LAB — BASIC METABOLIC PANEL
Anion gap: 7 (ref 5–15)
BUN: 18 mg/dL (ref 6–20)
CO2: 25 mmol/L (ref 22–32)
Calcium: 9.2 mg/dL (ref 8.9–10.3)
Chloride: 108 mmol/L (ref 98–111)
Creatinine, Ser: 0.8 mg/dL (ref 0.44–1.00)
GFR, Estimated: >60 mL/min (ref >60)
Glucose, Bld: 95 mg/dL (ref 70–99)
Potassium: 4.5 mmol/L (ref 3.5–5.1)
Sodium: 140 mmol/L (ref 135–145)

## 2021-11-22 LAB — CBC
HCT: 45.8 % (ref 36.0–46.0)
Hemoglobin: 15.2 g/dL — ABNORMAL HIGH (ref 12.0–15.0)
MCH: 30.5 pg (ref 26.0–34.0)
MCHC: 33.2 g/dL (ref 30.0–36.0)
MCV: 92 fL (ref 80.0–100.0)
Platelets: 150 10*3/uL (ref 150–400)
RBC: 4.98 MIL/uL (ref 3.87–5.11)
RDW: 11.9 % (ref 11.5–15.5)
WBC: 3.7 10*3/uL — ABNORMAL LOW (ref 4.0–10.5)
nRBC: 0 % (ref 0.0–0.2)

## 2021-11-22 LAB — GLUCOSE, CAPILLARY: Glucose-Capillary: 89 mg/dL (ref 70–99)

## 2021-12-03 ENCOUNTER — Ambulatory Visit (HOSPITAL_COMMUNITY)
Admission: RE | Admit: 2021-12-03 | Discharge: 2021-12-04 | Disposition: A | Discharge status: Home / Self Care | Payer: No Typology Code available for payment source | Source: Ambulatory Visit | Attending: Surgery | Admitting: Surgery

## 2021-12-03 ENCOUNTER — Ambulatory Visit (HOSPITAL_COMMUNITY): Payer: No Typology Code available for payment source | Admitting: Anesthesiology

## 2021-12-03 ENCOUNTER — Other Ambulatory Visit: Payer: Self-pay

## 2021-12-03 ENCOUNTER — Encounter (HOSPITAL_COMMUNITY)
Admission: RE | Disposition: A | Discharge status: Home / Self Care | Payer: Self-pay | Source: Ambulatory Visit | Attending: Surgery

## 2021-12-03 ENCOUNTER — Encounter (HOSPITAL_COMMUNITY): Payer: Self-pay | Admitting: Surgery

## 2021-12-03 ENCOUNTER — Ambulatory Visit (HOSPITAL_BASED_OUTPATIENT_CLINIC_OR_DEPARTMENT_OTHER): Payer: No Typology Code available for payment source | Admitting: Anesthesiology

## 2021-12-03 DIAGNOSIS — R519 Headache, unspecified: Secondary | ICD-10-CM | POA: Diagnosis not present

## 2021-12-03 DIAGNOSIS — R7303 Prediabetes: Secondary | ICD-10-CM

## 2021-12-03 DIAGNOSIS — K440 Diaphragmatic hernia with obstruction, without gangrene: Secondary | ICD-10-CM | POA: Diagnosis not present

## 2021-12-03 DIAGNOSIS — J9859 Other diseases of mediastinum, not elsewhere classified: Secondary | ICD-10-CM | POA: Diagnosis not present

## 2021-12-03 DIAGNOSIS — Z8719 Personal history of other diseases of the digestive system: Secondary | ICD-10-CM | POA: Diagnosis present

## 2021-12-03 DIAGNOSIS — K449 Diaphragmatic hernia without obstruction or gangrene: Secondary | ICD-10-CM | POA: Diagnosis not present

## 2021-12-03 DIAGNOSIS — K219 Gastro-esophageal reflux disease without esophagitis: Secondary | ICD-10-CM | POA: Diagnosis not present

## 2021-12-03 DIAGNOSIS — M069 Rheumatoid arthritis, unspecified: Secondary | ICD-10-CM | POA: Diagnosis not present

## 2021-12-03 HISTORY — PX: XI ROBOTIC ASSISTED PARAESOPHAGEAL HERNIA REPAIR: SHX6871

## 2021-12-03 LAB — BASIC METABOLIC PANEL
Anion gap: 6 (ref 5–15)
BUN: 12 mg/dL (ref 6–20)
CO2: 25 mmol/L (ref 22–32)
Calcium: 8.6 mg/dL — ABNORMAL LOW (ref 8.9–10.3)
Chloride: 108 mmol/L (ref 98–111)
Creatinine, Ser: 0.82 mg/dL (ref 0.44–1.00)
GFR, Estimated: >60 mL/min (ref >60)
Glucose, Bld: 158 mg/dL — ABNORMAL HIGH (ref 70–99)
Potassium: 3.6 mmol/L (ref 3.5–5.1)
Sodium: 139 mmol/L (ref 135–145)

## 2021-12-03 LAB — GLUCOSE, CAPILLARY: Glucose-Capillary: 80 mg/dL (ref 70–99)

## 2021-12-03 SURGERY — REPAIR, HERNIA, PARAESOPHAGEAL, ROBOT-ASSISTED
Anesthesia: General | Site: Abdomen

## 2021-12-03 MED ORDER — ONDANSETRON HCL 4 MG/2ML IJ SOLN
INTRAMUSCULAR | Status: AC
  Filled 2021-12-03: qty 2

## 2021-12-03 MED ORDER — GLYCOPYRROLATE 0.2 MG/ML IJ SOLN
INTRAMUSCULAR | Status: DC | PRN
  Administered 2021-12-03: .1 mg via INTRAVENOUS

## 2021-12-03 MED ORDER — ONDANSETRON HCL 4 MG/2ML IJ SOLN
4.0000 mg | Freq: Once | INTRAMUSCULAR | Status: DC | PRN
Start: 2021-12-03 — End: 2021-12-03

## 2021-12-03 MED ORDER — GABAPENTIN 300 MG PO CAPS
300.0000 mg | ORAL_CAPSULE | Freq: Two times a day (BID) | ORAL | Status: DC
  Administered 2021-12-03 – 2021-12-04 (×2): 300 mg via ORAL
  Filled 2021-12-03 (×2): qty 1

## 2021-12-03 MED ORDER — CEFAZOLIN SODIUM-DEXTROSE 2-4 GM/100ML-% IV SOLN
2.0000 g | INTRAVENOUS | Status: AC
  Administered 2021-12-03: 2 g via INTRAVENOUS
  Filled 2021-12-03: qty 100

## 2021-12-03 MED ORDER — BUPIVACAINE LIPOSOME 1.3 % IJ SUSP
INTRAMUSCULAR | Status: DC | PRN
  Administered 2021-12-03: 20 mL

## 2021-12-03 MED ORDER — PROPOFOL 10 MG/ML IV BOLUS
INTRAVENOUS | Status: AC
  Filled 2021-12-03: qty 20

## 2021-12-03 MED ORDER — OXYCODONE HCL 5 MG/5ML PO SOLN: 5.0000 mg | Freq: Once | ORAL | Status: DC | PRN

## 2021-12-03 MED ORDER — EPHEDRINE 5 MG/ML INJ
INTRAVENOUS | Status: AC
  Filled 2021-12-03: qty 5

## 2021-12-03 MED ORDER — DEXAMETHASONE SODIUM PHOSPHATE 10 MG/ML IJ SOLN
INTRAMUSCULAR | Status: AC
  Filled 2021-12-03: qty 1

## 2021-12-03 MED ORDER — CHLORHEXIDINE GLUCONATE 4 % EX LIQD: 60.0000 mL | Freq: Once | CUTANEOUS | Status: DC

## 2021-12-03 MED ORDER — DEXAMETHASONE SODIUM PHOSPHATE 10 MG/ML IJ SOLN
INTRAMUSCULAR | Status: DC | PRN
  Administered 2021-12-03: 10 mg via INTRAVENOUS

## 2021-12-03 MED ORDER — CHLORHEXIDINE GLUCONATE 0.12 % MT SOLN
15.0000 mL | Freq: Once | OROMUCOSAL | Status: AC
  Administered 2021-12-03: 15 mL via OROMUCOSAL

## 2021-12-03 MED ORDER — METHOCARBAMOL 1000 MG/10ML IJ SOLN
500.0000 mg | Freq: Four times a day (QID) | INTRAVENOUS | Status: DC | PRN
  Filled 2021-12-03: qty 5

## 2021-12-03 MED ORDER — GABAPENTIN 300 MG PO CAPS
300.0000 mg | ORAL_CAPSULE | ORAL | Status: AC
  Administered 2021-12-03: 300 mg via ORAL
  Filled 2021-12-03: qty 1

## 2021-12-03 MED ORDER — MIDAZOLAM HCL 5 MG/5ML IJ SOLN
INTRAMUSCULAR | Status: DC | PRN
  Administered 2021-12-03: 2 mg via INTRAVENOUS

## 2021-12-03 MED ORDER — LACTATED RINGERS IV SOLN: INTRAVENOUS | Status: DC

## 2021-12-03 MED ORDER — ONDANSETRON 4 MG PO TBDP: 4.0000 mg | ORAL_TABLET | Freq: Four times a day (QID) | ORAL | Status: DC | PRN

## 2021-12-03 MED ORDER — METOPROLOL TARTRATE 5 MG/5ML IV SOLN: 5.0000 mg | Freq: Four times a day (QID) | INTRAVENOUS | Status: DC | PRN

## 2021-12-03 MED ORDER — DIPHENHYDRAMINE HCL 50 MG/ML IJ SOLN: 25.0000 mg | Freq: Four times a day (QID) | INTRAMUSCULAR | Status: DC | PRN

## 2021-12-03 MED ORDER — ACETAMINOPHEN 500 MG PO TABS
1000.0000 mg | ORAL_TABLET | Freq: Once | ORAL | Status: AC
  Administered 2021-12-03: 1000 mg via ORAL
  Filled 2021-12-03: qty 2

## 2021-12-03 MED ORDER — LIDOCAINE HCL (PF) 2 % IJ SOLN
INTRAMUSCULAR | Status: AC
  Filled 2021-12-03: qty 5

## 2021-12-03 MED ORDER — FENTANYL CITRATE (PF) 250 MCG/5ML IJ SOLN
INTRAMUSCULAR | Status: DC | PRN
  Administered 2021-12-03: 100 ug via INTRAVENOUS
  Administered 2021-12-03: 50 ug via INTRAVENOUS

## 2021-12-03 MED ORDER — LIDOCAINE 2% (20 MG/ML) 5 ML SYRINGE
INTRAMUSCULAR | Status: DC | PRN
  Administered 2021-12-03: 1.5 mg/kg/h via INTRAVENOUS
  Administered 2021-12-03: 100 mg via INTRAVENOUS

## 2021-12-03 MED ORDER — PROPOFOL 10 MG/ML IV BOLUS
INTRAVENOUS | Status: DC | PRN
  Administered 2021-12-03: 150 mg via INTRAVENOUS

## 2021-12-03 MED ORDER — KETOROLAC TROMETHAMINE 15 MG/ML IJ SOLN: 15.0000 mg | Freq: Four times a day (QID) | INTRAMUSCULAR | Status: DC | PRN

## 2021-12-03 MED ORDER — PROPRANOLOL HCL 10 MG PO TABS
20.0000 mg | ORAL_TABLET | Freq: Two times a day (BID) | ORAL | Status: DC
  Administered 2021-12-03 – 2021-12-04 (×2): 20 mg via ORAL
  Filled 2021-12-03 (×2): qty 2

## 2021-12-03 MED ORDER — 0.9 % SODIUM CHLORIDE (POUR BTL) OPTIME
TOPICAL | Status: DC | PRN
  Administered 2021-12-03: 1000 mL

## 2021-12-03 MED ORDER — SUGAMMADEX SODIUM 500 MG/5ML IV SOLN
INTRAVENOUS | Status: AC
  Filled 2021-12-03: qty 5

## 2021-12-03 MED ORDER — SUMATRIPTAN SUCCINATE 50 MG PO TABS: 50.0000 mg | ORAL_TABLET | ORAL | Status: DC | PRN

## 2021-12-03 MED ORDER — FENTANYL CITRATE (PF) 250 MCG/5ML IJ SOLN
INTRAMUSCULAR | Status: AC
  Filled 2021-12-03: qty 5

## 2021-12-03 MED ORDER — EPHEDRINE SULFATE-NACL 50-0.9 MG/10ML-% IV SOSY
PREFILLED_SYRINGE | INTRAVENOUS | Status: DC | PRN
  Administered 2021-12-03 (×2): 5 mg via INTRAVENOUS

## 2021-12-03 MED ORDER — ONDANSETRON HCL 4 MG/2ML IJ SOLN: 4.0000 mg | Freq: Four times a day (QID) | INTRAMUSCULAR | Status: DC | PRN

## 2021-12-03 MED ORDER — ACETAMINOPHEN 500 MG PO TABS
1000.0000 mg | ORAL_TABLET | Freq: Four times a day (QID) | ORAL | Status: DC
  Administered 2021-12-04 (×3): 1000 mg via ORAL
  Filled 2021-12-03 (×3): qty 2

## 2021-12-03 MED ORDER — ROCURONIUM BROMIDE 10 MG/ML (PF) SYRINGE
PREFILLED_SYRINGE | INTRAVENOUS | Status: AC
  Filled 2021-12-03: qty 10

## 2021-12-03 MED ORDER — GLYCOPYRROLATE 0.2 MG/ML IJ SOLN
INTRAMUSCULAR | Status: AC
  Filled 2021-12-03: qty 1

## 2021-12-03 MED ORDER — HEPARIN SODIUM (PORCINE) 5000 UNIT/ML IJ SOLN
5000.0000 [IU] | Freq: Three times a day (TID) | INTRAMUSCULAR | Status: DC
Start: 2021-12-03 — End: 2021-12-04
  Administered 2021-12-03 – 2021-12-04 (×2): 5000 [IU] via SUBCUTANEOUS
  Filled 2021-12-03 (×2): qty 1

## 2021-12-03 MED ORDER — DIPHENHYDRAMINE HCL 25 MG PO CAPS: 25.0000 mg | ORAL_CAPSULE | Freq: Four times a day (QID) | ORAL | Status: DC | PRN

## 2021-12-03 MED ORDER — BUPIVACAINE LIPOSOME 1.3 % IJ SUSP: 20.0000 mL | Freq: Once | INTRAMUSCULAR | Status: DC

## 2021-12-03 MED ORDER — AMISULPRIDE (ANTIEMETIC) 5 MG/2ML IV SOLN
10.0000 mg | Freq: Once | INTRAVENOUS | Status: DC | PRN
Start: 2021-12-03 — End: 2021-12-03

## 2021-12-03 MED ORDER — ACETAMINOPHEN 500 MG PO TABS
1000.0000 mg | ORAL_TABLET | ORAL | Status: AC
  Administered 2021-12-03: 1000 mg via ORAL
  Filled 2021-12-03: qty 2

## 2021-12-03 MED ORDER — ORAL CARE MOUTH RINSE: 15.0000 mL | Freq: Once | OROMUCOSAL | Status: AC

## 2021-12-03 MED ORDER — BUPIVACAINE-EPINEPHRINE (PF) 0.25% -1:200000 IJ SOLN
INTRAMUSCULAR | Status: AC
  Filled 2021-12-03: qty 30

## 2021-12-03 MED ORDER — SODIUM CHLORIDE 0.9 % IV SOLN: INTRAVENOUS | Status: DC

## 2021-12-03 MED ORDER — FENTANYL CITRATE PF 50 MCG/ML IJ SOSY
PREFILLED_SYRINGE | INTRAMUSCULAR | Status: AC
  Filled 2021-12-03: qty 2

## 2021-12-03 MED ORDER — BUPIVACAINE-EPINEPHRINE 0.25% -1:200000 IJ SOLN
INTRAMUSCULAR | Status: DC | PRN
  Administered 2021-12-03: 30 mL

## 2021-12-03 MED ORDER — SIMETHICONE 80 MG PO CHEW: 40.0000 mg | CHEWABLE_TABLET | Freq: Four times a day (QID) | ORAL | Status: DC | PRN

## 2021-12-03 MED ORDER — BUPIVACAINE LIPOSOME 1.3 % IJ SUSP
INTRAMUSCULAR | Status: AC
  Filled 2021-12-03: qty 20

## 2021-12-03 MED ORDER — TRAMADOL HCL 50 MG PO TABS: 50.0000 mg | ORAL_TABLET | Freq: Four times a day (QID) | ORAL | Status: DC | PRN

## 2021-12-03 MED ORDER — LIDOCAINE HCL 2 % IJ SOLN
INTRAMUSCULAR | Status: AC
  Filled 2021-12-03: qty 20

## 2021-12-03 MED ORDER — FENTANYL CITRATE PF 50 MCG/ML IJ SOSY
25.0000 ug | PREFILLED_SYRINGE | INTRAMUSCULAR | Status: DC | PRN
  Administered 2021-12-03 (×2): 50 ug via INTRAVENOUS

## 2021-12-03 MED ORDER — OXYCODONE HCL 5 MG PO TABS
5.0000 mg | ORAL_TABLET | Freq: Three times a day (TID) | ORAL | Status: DC | PRN
  Administered 2021-12-04: 5 mg via ORAL
  Filled 2021-12-03 (×2): qty 1

## 2021-12-03 MED ORDER — HYDROMORPHONE HCL 1 MG/ML IJ SOLN: 0.5000 mg | INTRAMUSCULAR | Status: DC | PRN

## 2021-12-03 MED ORDER — PHENYLEPHRINE HCL-NACL 20-0.9 MG/250ML-% IV SOLN
INTRAVENOUS | Status: DC | PRN
  Administered 2021-12-03: 35 ug/min via INTRAVENOUS

## 2021-12-03 MED ORDER — BISACODYL 10 MG RE SUPP: 10.0000 mg | Freq: Every day | RECTAL | Status: DC | PRN

## 2021-12-03 MED ORDER — MIDAZOLAM HCL 2 MG/2ML IJ SOLN
INTRAMUSCULAR | Status: AC
  Filled 2021-12-03: qty 2

## 2021-12-03 MED ORDER — METOCLOPRAMIDE HCL 5 MG/ML IJ SOLN
10.0000 mg | Freq: Four times a day (QID) | INTRAMUSCULAR | Status: DC
  Administered 2021-12-03 – 2021-12-04 (×4): 10 mg via INTRAVENOUS
  Filled 2021-12-03 (×4): qty 2

## 2021-12-03 MED ORDER — LACTATED RINGERS IV SOLN: INTRAVENOUS | Status: DC | PRN

## 2021-12-03 MED ORDER — ROCURONIUM BROMIDE 10 MG/ML (PF) SYRINGE
PREFILLED_SYRINGE | INTRAVENOUS | Status: AC
Start: 2021-12-03 — End: ?
  Filled 2021-12-03: qty 10

## 2021-12-03 MED ORDER — SUGAMMADEX SODIUM 500 MG/5ML IV SOLN
INTRAVENOUS | Status: DC | PRN
  Administered 2021-12-03: 300 mg via INTRAVENOUS

## 2021-12-03 MED ORDER — STERILE WATER FOR IRRIGATION IR SOLN
Status: DC | PRN
  Administered 2021-12-03: 500 mL

## 2021-12-03 MED ORDER — DOCUSATE SODIUM 100 MG PO CAPS
100.0000 mg | ORAL_CAPSULE | Freq: Two times a day (BID) | ORAL | Status: DC
  Administered 2021-12-03 – 2021-12-04 (×2): 100 mg via ORAL
  Filled 2021-12-03 (×2): qty 1

## 2021-12-03 MED ORDER — PANTOPRAZOLE SODIUM 40 MG IV SOLR
40.0000 mg | Freq: Every day | INTRAVENOUS | Status: DC
  Administered 2021-12-03: 40 mg via INTRAVENOUS
  Filled 2021-12-03: qty 10

## 2021-12-03 MED ORDER — ONDANSETRON HCL 4 MG/2ML IJ SOLN
INTRAMUSCULAR | Status: DC | PRN
  Administered 2021-12-03: 4 mg via INTRAVENOUS

## 2021-12-03 MED ORDER — OXYCODONE HCL 5 MG PO TABS: 5.0000 mg | ORAL_TABLET | Freq: Once | ORAL | Status: DC | PRN

## 2021-12-03 MED ORDER — HYDRALAZINE HCL 20 MG/ML IJ SOLN: 10.0000 mg | INTRAMUSCULAR | Status: DC | PRN

## 2021-12-03 MED ORDER — ROCURONIUM BROMIDE 10 MG/ML (PF) SYRINGE
PREFILLED_SYRINGE | INTRAVENOUS | Status: DC | PRN
  Administered 2021-12-03: 70 mg via INTRAVENOUS
  Administered 2021-12-03: 10 mg via INTRAVENOUS
  Administered 2021-12-03: 30 mg via INTRAVENOUS

## 2021-12-03 SURGICAL SUPPLY — 76 items
ADH SKN CLS APL DERMABOND .7 (GAUZE/BANDAGES/DRESSINGS) ×1
APL PRP STRL LF DISP 70% ISPRP (MISCELLANEOUS) ×1
APL SKNCLS STERI-STRIP NONHPOA (GAUZE/BANDAGES/DRESSINGS) ×1
APPLIER CLIP 5 13 M/L LIGAMAX5 (MISCELLANEOUS)
APPLIER CLIP ROT 10 11.4 M/L (STAPLE)
APR CLP MED LRG 11.4X10 (STAPLE)
APR CLP MED LRG 5 ANG JAW (MISCELLANEOUS)
BAG COUNTER SPONGE SURGICOUNT (BAG) ×2 IMPLANT
BAG SPNG CNTER NS LX DISP (BAG) ×1
BENZOIN TINCTURE PRP APPL 2/3 (GAUZE/BANDAGES/DRESSINGS) ×1 IMPLANT
BLADE SURG SZ11 CARB STEEL (BLADE) ×2 IMPLANT
BNDG CMPR MED 15X6 ELC VLCR LF (GAUZE/BANDAGES/DRESSINGS) ×1
BNDG ELASTIC 6X15 VLCR STRL LF (GAUZE/BANDAGES/DRESSINGS) ×1 IMPLANT
CHLORAPREP W/TINT 26 (MISCELLANEOUS) ×2 IMPLANT
CLIP APPLIE 5 13 M/L LIGAMAX5 (MISCELLANEOUS) IMPLANT
CLIP APPLIE ROT 10 11.4 M/L (STAPLE) IMPLANT
COVER SURGICAL LIGHT HANDLE (MISCELLANEOUS) ×2 IMPLANT
COVER TIP SHEARS 8 DVNC (MISCELLANEOUS) IMPLANT
COVER TIP SHEARS 8MM DA VINCI (MISCELLANEOUS) ×2
DERMABOND ADVANCED (GAUZE/BANDAGES/DRESSINGS) ×1
DERMABOND ADVANCED .7 DNX12 (GAUZE/BANDAGES/DRESSINGS) ×1 IMPLANT
DRAIN PENROSE 0.5X18 (DRAIN) ×1 IMPLANT
DRAPE ARM DVNC X/XI (DISPOSABLE) ×4 IMPLANT
DRAPE COLUMN DVNC XI (DISPOSABLE) ×1 IMPLANT
DRAPE DA VINCI XI ARM (DISPOSABLE) ×8
DRAPE DA VINCI XI COLUMN (DISPOSABLE) ×2
ELECT REM PT RETURN 15FT ADLT (MISCELLANEOUS) ×2 IMPLANT
ENDOLOOP SUT PDS II  0 18 (SUTURE)
ENDOLOOP SUT PDS II 0 18 (SUTURE) IMPLANT
GAUZE 4X4 16PLY ~~LOC~~+RFID DBL (SPONGE) ×2 IMPLANT
GLOVE BIO SURGEON STRL SZ 6 (GLOVE) ×6 IMPLANT
GLOVE INDICATOR 6.5 STRL GRN (GLOVE) ×6 IMPLANT
GLOVE SS BIOGEL STRL SZ 6 (GLOVE) ×1 IMPLANT
GLOVE SUPERSENSE BIOGEL SZ 6 (GLOVE) ×1
GOWN STRL REUS W/ TWL LRG LVL3 (GOWN DISPOSABLE) ×3 IMPLANT
GOWN STRL REUS W/ TWL XL LVL3 (GOWN DISPOSABLE) IMPLANT
GOWN STRL REUS W/TWL LRG LVL3 (GOWN DISPOSABLE) ×6
GOWN STRL REUS W/TWL XL LVL3 (GOWN DISPOSABLE)
IRRIG SUCT STRYKERFLOW 2 WTIP (MISCELLANEOUS) ×2
IRRIGATION SUCT STRKRFLW 2 WTP (MISCELLANEOUS) ×1 IMPLANT
KIT BASIN OR (CUSTOM PROCEDURE TRAY) ×2 IMPLANT
KIT TURNOVER KIT A (KITS) IMPLANT
LUBRICANT JELLY K Y 4OZ (MISCELLANEOUS) IMPLANT
MARKER SKIN DUAL TIP RULER LAB (MISCELLANEOUS) ×2 IMPLANT
NDL INSUFFLATION 14GA 120MM (NEEDLE) ×1 IMPLANT
NEEDLE HYPO 22GX1.5 SAFETY (NEEDLE) ×2 IMPLANT
NEEDLE INSUFFLATION 14GA 120MM (NEEDLE) ×2 IMPLANT
PACK CARDIOVASCULAR III (CUSTOM PROCEDURE TRAY) ×2 IMPLANT
PAD POSITIONING PINK XL (MISCELLANEOUS) ×2 IMPLANT
SCISSORS LAP 5X35 DISP (ENDOMECHANICALS) IMPLANT
SEAL CANN UNIV 5-8 DVNC XI (MISCELLANEOUS) ×4 IMPLANT
SEAL XI 5MM-8MM UNIVERSAL (MISCELLANEOUS) ×8
SEALER VESSEL DA VINCI XI (MISCELLANEOUS) ×2
SEALER VESSEL EXT DVNC XI (MISCELLANEOUS) ×1 IMPLANT
SOL ANTI FOG 6CC (MISCELLANEOUS) ×1 IMPLANT
SOLUTION ANTI FOG 6CC (MISCELLANEOUS) ×1
SOLUTION ELECTROLUBE (MISCELLANEOUS) ×2 IMPLANT
SPIKE FLUID TRANSFER (MISCELLANEOUS) ×2 IMPLANT
STRIP CLOSURE SKIN 1/2X4 (GAUZE/BANDAGES/DRESSINGS) ×1 IMPLANT
SUT ETHIBOND 0 36 GRN (SUTURE) ×5 IMPLANT
SUT MNCRL AB 4-0 PS2 18 (SUTURE) ×2 IMPLANT
SUT SILK 0 SH 30 (SUTURE) IMPLANT
SUT SILK 2 0 SH (SUTURE) IMPLANT
SUT VIC AB 3-0 SH 27 (SUTURE) ×2
SUT VIC AB 3-0 SH 27XBRD (SUTURE) IMPLANT
SYR 10ML ECCENTRIC (SYRINGE) IMPLANT
SYR 20ML LL LF (SYRINGE) ×2 IMPLANT
TIP INNERVISION DETACH 40FR (MISCELLANEOUS) IMPLANT
TIP INNERVISION DETACH 50FR (MISCELLANEOUS) IMPLANT
TIP INNERVISION DETACH 56FR (MISCELLANEOUS) IMPLANT
TIPS INNERVISION DETACH 40FR (MISCELLANEOUS)
TOWEL OR 17X26 10 PK STRL BLUE (TOWEL DISPOSABLE) ×2 IMPLANT
TOWEL OR NON WOVEN STRL DISP B (DISPOSABLE) ×2 IMPLANT
TRAY FOLEY MTR SLVR 14FR STAT (SET/KITS/TRAYS/PACK) ×1 IMPLANT
TROCAR ADV FIXATION 5X100MM (TROCAR) ×2 IMPLANT
TUBING INSUFFLATION 10FT LAP (TUBING) ×2 IMPLANT

## 2021-12-03 NOTE — Anesthesia Procedure Notes (Signed)
Procedure Name: Intubation Date/Time: 12/03/2021 12:09 PM  Performed by: Maxwell Caul, CRNAPre-anesthesia Checklist: Patient identified, Emergency Drugs available, Suction available and Patient being monitored Patient Re-evaluated:Patient Re-evaluated prior to induction Oxygen Delivery Method: Circle system utilized Preoxygenation: Pre-oxygenation with 100% oxygen Induction Type: IV induction Ventilation: Mask ventilation without difficulty Laryngoscope Size: Mac and 4 Grade View: Grade I Tube type: Oral Tube size: 7.0 mm Number of attempts: 1 Airway Equipment and Method: Stylet Placement Confirmation: ETT inserted through vocal cords under direct vision, positive ETCO2 and breath sounds checked- equal and bilateral Secured at: 21 cm Tube secured with: Tape Dental Injury: Teeth and Oropharynx as per pre-operative assessment

## 2021-12-03 NOTE — Transfer of Care (Signed)
Immediate Anesthesia Transfer of Care Note  Patient: Rhonda Harris  Procedure(s) Performed: XI ROBOTIC ASSISTED PARAESOPHAGEAL HERNIA REPAIR WITH FUNDOPLICATION AND UPPER GI ENDOSCOPY (Abdomen)  Patient Location: PACU  Anesthesia Type:General  Level of Consciousness: awake, alert  and oriented  Airway & Oxygen Therapy: Patient Spontanous Breathing and Patient connected to face mask oxygen  Post-op Assessment: Report given to RN, Post -op Vital signs reviewed and stable and Patient moving all extremities X 4  Post vital signs: Reviewed and stable  Last Vitals:  Vitals Value Taken Time  BP 155/87   Temp    Pulse 77   Resp 12   SpO2 100     Last Pain:  Vitals:   12/03/21 1055  TempSrc:   PainSc: 0-No pain         Complications: No notable events documented.

## 2021-12-03 NOTE — Anesthesia Preprocedure Evaluation (Signed)
Anesthesia Evaluation  Patient identified by MRN, date of birth, ID band Patient awake    Reviewed: Allergy & Precautions, NPO status , Patient's Chart, lab work & pertinent test results  History of Anesthesia Complications Negative for: history of anesthetic complications  Airway Mallampati: II  TM Distance: >3 FB Neck ROM: Full    Dental  (+) Teeth Intact, Dental Advisory Given   Pulmonary neg pulmonary ROS, former smoker,    Pulmonary exam normal        Cardiovascular negative cardio ROS Normal cardiovascular exam     Neuro/Psych  Headaches,    GI/Hepatic Neg liver ROS, hiatal hernia, GERD  ,  Endo/Other  negative endocrine ROS  Renal/GU negative Renal ROS  negative genitourinary   Musculoskeletal  (+) Arthritis , Rheumatoid disorders,    Abdominal   Peds  Hematology negative hematology ROS (+)   Anesthesia Other Findings   Reproductive/Obstetrics                            Anesthesia Physical Anesthesia Plan  ASA: 2  Anesthesia Plan: General   Post-op Pain Management: Tylenol PO (pre-op)*, Toradol IV (intra-op)* and Lidocaine infusion*   Induction: Intravenous and Rapid sequence  PONV Risk Score and Plan: 4 or greater and Ondansetron, Dexamethasone, Treatment may vary due to age or medical condition and Midazolam  Airway Management Planned: Oral ETT  Additional Equipment: None  Intra-op Plan:   Post-operative Plan: Extubation in OR  Informed Consent: I have reviewed the patients History and Physical, chart, labs and discussed the procedure including the risks, benefits and alternatives for the proposed anesthesia with the patient or authorized representative who has indicated his/her understanding and acceptance.     Dental advisory given  Plan Discussed with:   Anesthesia Plan Comments:        Anesthesia Quick Evaluation

## 2021-12-03 NOTE — Op Note (Signed)
Operative Note  Rhonda Harris  [id number]  [id number]  12/03/2021   Surgeon: Romana Juniper MD FACS   Assistant: Greer Pickerel MD FACS   Procedure performed: Robotic repair of incarcerated type III paraesophageal hernia with toupee fundoplication   Preop diagnosis: Giant type III paraesophageal hernia with the entirety of the stomach herniated into the chest Post-op diagnosis/intraop findings: Same   Specimens: No Retained items: no  EBL: Minimal cc Complications: none   Description of procedure: After obtaining informed consent the patient was taken to the operating room and placed supine on operating room table where general endotracheal anesthesia was initiated, preoperative antibiotics were administered, SCDs applied, and a formal timeout was performed.  The abdomen was prepped and draped in usual sterile fashion.  Peritoneal access was gained with a left subcostal Veress needle and insufflation to 15 mmHg ensued without incident.  An 8 mm periumbilical trocar and camera were placed and the abdomen surveyed and confirmed to be free of injury.  Bilateral laparoscopic assisted tap blocks with Exparel mixed with quarter percent Marcaine with epinephrine performed and the remaining trocars were inserted under direct visualization along with a liver retractor to elevate the left lobe.  The robot was then docked and then all instruments inserted under direct visualization.  Beginning anteriorly, the hernia sac was dissected from the diaphragmatic crura using the vessel sealer and blunt dissection until it had been circumferentially freed and was able to be reduced into the abdomen along with the stomach.  This was somewhat tedious as the patient had a chronically thickened sac with a fair amount of adhesions to the mediastinum.  The short gastric vessels were taken down with the vessel sealer.  We then proceeded to mobilize the esophagus by dividing the mediastinal attachments bluntly and with the  vessel sealer until we had achieved 5 cm of intra-abdominal length.  Upper endoscopy was performed at this point to confirm anatomy.  The hernia sac was then excised with the vessel sealer and discarded. The crura were reapproximated posteriorly with 4 simple interrupted 0 Ethibonds narrowing the hiatus to a diameter of just over 2 cm.  The fundus was brought posteriorly around the esophagus and the shoeshine maneuver performed confirming appropriate orientation.  A 270 degree toupet wrap was completed with 3 simple interrupted 0 Ethibonds on each side.  The right side of the wrap was also pexied to the right crus with a simple interrupted 0 Ethibond.  Completion the distal esophagus and wrap rest within the abdominal cavity without tension and there is no twisting or angulation of the esophagus.  A repeat upper endoscopy was performed confirming that the GE junction is easily traversable, the wrap was inspected and confirmed to be appropriately oriented and intact.  No endoscopic evidence of injury to the esophagus or stomach the stomach was desufflated and the endoscope removed.   The abdomen was surveyed once again and hemostasis confirmed.  The liver retractor was removed and the robot was undocked.  The abdomen was desufflated and all trocars removed.  Skin incisions were closed with subcuticular 4 Monocryl, benzoin and Steri-Strips and Band-Aids were then applied. The patient was then awakened, extubated and taken to PACU in stable condition.    All counts were correct at the completion of the case.

## 2021-12-03 NOTE — H&P (Signed)
Rhonda Harris [id number]    Referring Provider:  Karie Soda, RN     Subjective    Chief Complaint: Hernia       History of Present Illness:    61 year old woman with history of GERD, esophageal erosions, celiac disease who was referred by Dr. Sydell Harris for evaluation of a large hiatal hernia.  This was confirmed on upper endoscopy last month which shows may be half of her stomach herniated into the chest. She has known about this for at least 8 years, but until a few months ago it was asymptomatic with the exception of mild reflux controlled with PPI.  For the last few months she has had pain in the chest (subxiphoid) radiating to her left shoulder and back that comes on when she eats.  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  She does note some nausea and some relief with occasional emesis.  Denies any abdominal or specifically right upper quadrant pain.  This is happened intermittently, perhaps 1-2 times per week.  Denies any dysphagia.  Reports her reflux remains well controlled on Protonix. In addition to the EGD, she is also had an ultrasound showing a 1.8 cm gallstone without evidence of cholecystitis but hepatic steatosis noted.  Upper GI a couple weeks ago confirms large sliding hiatal hernia with almost the entire stomach in the thoracic space, no definite esophageal mass or stricture, no obstruction, small diverticulum at the D2.       Review of Systems: A complete review of systems was obtained from the patient.  I have reviewed this information and discussed as appropriate with the patient.  See HPI as well for other ROS.     Medical History: Past Medical History         Past Medical History:  Diagnosis Date   Anemia          There is no problem list on file for this patient.     Past  Surgical History           Past Surgical History:  Procedure Laterality Date   ESSURE TUBAL LIGATION            Allergies  No Known Allergies                 Current Outpatient Medications on File Prior to Visit  Medication Sig Dispense Refill   hydrOXYchloroQUINE (PLAQUENIL) 200 mg tablet 1 tab       nabumetone (RELAFEN) 500 MG tablet 1 tablet       pantoprazole (PROTONIX) 40 MG DR tablet 1 tablet       propranoloL (INDERAL) 20 MG tablet Take 20 mg by mouth 2 (two) times daily       SUMAtriptan (IMITREX) 50 MG tablet 1 tablet at least 2 hours between doses as needed       calcium carbonate 500 mg calcium (1,250 mg) tablet 1 tablet with meals       cholecalciferol (VITAMIN D3) 2,000 unit capsule 1 capsule       ferrous sulfate 325 (65 FE) MG tablet 1 tablet       multivitamin tablet Take 1 tablet by mouth        No current facility-administered medications on file prior to visit.      Family  History           Family History  Problem Relation Age of Onset   Colon cancer Father          Social History         Tobacco Use  Smoking Status Never  Smokeless Tobacco Never      Social History  Social History           Socioeconomic History   Marital status: Married  Tobacco Use   Smoking status: Never   Smokeless tobacco: Never  Vaping Use   Vaping Use: Never used  Substance and Sexual Activity   Alcohol use: Never   Drug use: Never        Objective:           Vitals:    10/18/21 0953  BP: (!) 140/80  Pulse: 70  Temp: 36.9 C (98.4 F)  SpO2: 98%  Weight: (!) 109.6 kg (241 lb 9.6 oz)  Height: 167.6 cm (5' 6" )    Body mass index is 39 kg/m.   Alert and well-appearing Unlabored respirations Abdomen is soft, nontender, nondistended   Assessment and Plan:  Diagnoses and all orders for this visit:   Paraesophageal hiatal hernia     We discussed the relevant anatomy using a diagram to demonstrate, and went over the surgical technique to repair  this.  Discussed the typical perioperative course and we discussed risks of bleeding, infection, pain, scarring, intra-abdominal or mediastinal injury, cardiovascular/pulmonary/thromboembolic complications.  We also discussed possible long-term sequelae including hiatal hernia recurrence, which in her case is higher risk due to obesity and we discussed this.  We discussed risks of dysphagia, gas bloat, persistent symptoms, recurrent reflux, especially if gastropexy is needed/sufficient esophageal length to complete an appropriate wrap, etc. Questions welcomed and answered.  Given her symptoms I do believe it is merited to go forth with repair however I did encourage her to work on weight loss in the interim, I anticipate it will be about a month before her surgery is scheduled in any way that she is able to lose in the interim will help decrease her risk of recurrence.     Rhonda Hommes Raquel James, MD

## 2021-12-03 NOTE — Anesthesia Postprocedure Evaluation (Signed)
Anesthesia Post Note  Patient: Rhonda Harris  Procedure(s) Performed: XI ROBOTIC ASSISTED PARAESOPHAGEAL HERNIA REPAIR WITH FUNDOPLICATION AND UPPER GI ENDOSCOPY (Abdomen)     Patient location during evaluation: PACU Anesthesia Type: General Level of consciousness: awake and alert Pain management: pain level controlled Vital Signs Assessment: post-procedure vital signs reviewed and stable Respiratory status: spontaneous breathing, nonlabored ventilation and respiratory function stable Cardiovascular status: blood pressure returned to baseline and stable Postop Assessment: no apparent nausea or vomiting Anesthetic complications: no   No notable events documented.  Last Vitals:  Vitals:   12/03/21 1545 12/03/21 1600  BP: (!) 146/81 132/79  Pulse: 71 74  Resp: 17 17  Temp:  36.6 C  SpO2: 95% 94%    Last Pain:  Vitals:   12/03/21 1600  TempSrc:   PainSc: 2                  Lidia Collum

## 2021-12-04 ENCOUNTER — Ambulatory Visit (HOSPITAL_COMMUNITY): Payer: No Typology Code available for payment source

## 2021-12-04 ENCOUNTER — Encounter (HOSPITAL_COMMUNITY): Payer: Self-pay | Admitting: Surgery

## 2021-12-04 DIAGNOSIS — K440 Diaphragmatic hernia with obstruction, without gangrene: Secondary | ICD-10-CM | POA: Diagnosis not present

## 2021-12-04 LAB — CBC
HCT: 42.1 % (ref 36.0–46.0)
Hemoglobin: 13.8 g/dL (ref 12.0–15.0)
MCH: 30.3 pg (ref 26.0–34.0)
MCHC: 32.8 g/dL (ref 30.0–36.0)
MCV: 92.5 fL (ref 80.0–100.0)
Platelets: 190 10*3/uL (ref 150–400)
RBC: 4.55 MIL/uL (ref 3.87–5.11)
RDW: 11.9 % (ref 11.5–15.5)
WBC: 7.7 10*3/uL (ref 4.0–10.5)
nRBC: 0 % (ref 0.0–0.2)

## 2021-12-04 LAB — BASIC METABOLIC PANEL
Anion gap: 6 (ref 5–15)
BUN: 10 mg/dL (ref 6–20)
CO2: 24 mmol/L (ref 22–32)
Calcium: 8.4 mg/dL — ABNORMAL LOW (ref 8.9–10.3)
Chloride: 110 mmol/L (ref 98–111)
Creatinine, Ser: 0.66 mg/dL (ref 0.44–1.00)
GFR, Estimated: >60 mL/min (ref >60)
Glucose, Bld: 125 mg/dL — ABNORMAL HIGH (ref 70–99)
Potassium: 4.1 mmol/L (ref 3.5–5.1)
Sodium: 140 mmol/L (ref 135–145)

## 2021-12-04 LAB — MAGNESIUM: Magnesium: 1.7 mg/dL (ref 1.7–2.4)

## 2021-12-04 MED ORDER — TRAMADOL HCL 50 MG PO TABS
50.0000 mg | ORAL_TABLET | Freq: Four times a day (QID) | ORAL | 0 refills | Status: AC | PRN
Start: 2021-12-04 — End: 2021-12-09

## 2021-12-04 MED ORDER — MAGNESIUM SULFATE 50 % IJ SOLN
3.0000 g | Freq: Once | INTRAMUSCULAR | Status: AC
  Administered 2021-12-04: 3 g via INTRAVENOUS
  Filled 2021-12-04: qty 6

## 2021-12-04 MED ORDER — IOHEXOL 300 MG/ML  SOLN
150.0000 mL | Freq: Once | INTRAMUSCULAR | Status: AC | PRN
  Administered 2021-12-04: 100 mL via ORAL

## 2021-12-04 MED ORDER — ONDANSETRON HCL 4 MG PO TABS: 4.0000 mg | ORAL_TABLET | Freq: Three times a day (TID) | ORAL | 0 refills | Status: AC | PRN

## 2021-12-04 MED ORDER — GABAPENTIN 300 MG PO CAPS: 300.0000 mg | ORAL_CAPSULE | Freq: Two times a day (BID) | ORAL | 0 refills | Status: AC

## 2021-12-04 MED ORDER — DOCUSATE SODIUM 100 MG PO CAPS
100.0000 mg | ORAL_CAPSULE | Freq: Two times a day (BID) | ORAL | 0 refills | Status: AC
Start: 2021-12-04 — End: ?

## 2021-12-04 NOTE — Progress Notes (Signed)
Transition of Care [facility]) Screening Note  Patient Details  Name: Rhonda Harris Date of Birth: 04-04-1961  Transition of Care [facility]) CM/SW Contact:    Sherie Don, LCSW Phone Number: 12/04/2021, 8:53 AM  Transition of Care Department [facility]) has reviewed patient and no TOC needs have been identified at this time. We will continue to monitor patient advancement through interdisciplinary progression rounds. If new patient transition needs arise, please place a TOC consult.

## 2021-12-04 NOTE — Discharge Instructions (Signed)
POST OP INSTRUCTIONS   EAT See diet progression instructions below  WALK Walk an hour a day (cumulative- not all at once).  Control your pain to do that.    CONTROL PAIN Control pain so that you can walk, sleep, tolerate sneezing/coughing, go up/down stairs.  HAVE A BOWEL MOVEMENT DAILY Keep your bowels regular to avoid problems.  OK to try a laxative to override constipation.  OK to use an antidairrheal to slow down diarrhea.  Call if not better after 2 tries  CALL IF YOU HAVE PROBLEMS/CONCERNS Call if you are still struggling despite following these instructions. Call if you have concerns not answered by these instructions  Take your usually prescribed home medications unless otherwise directed.  PAIN CONTROL: Pain is best controlled by a usual combination of three different methods TOGETHER: Ice/Heat Over the counter pain medication Prescription pain medication Most patients will experience some swelling and bruising around the incisions.  Ice packs or heating pads (30-60 minutes up to 6 times a day) will help. Use ice for the first few days to help decrease swelling and bruising, then switch to heat to help relax tight/sore spots and speed recovery.  Some people prefer to use ice alone, heat alone, alternating between ice & heat.  Experiment to what works for you.  Swelling and bruising can take several weeks to resolve.   It is helpful to take an over-the-counter pain medication regularly for the first few days: Naproxen (Aleve, etc)  Two 272m tabs twice a day OR Ibuprofen (Advil, etc) Three 2079mtabs four times a day (every meal & bedtime) AND Acetaminophen (Tylenol, etc) 500-65071mour times a day (every meal & bedtime) A  prescription for pain medication (such as oxycodone, hydrocodone, tramadol, gabapentin, methocarbamol, etc) should be given to you upon discharge.  Take your pain medication as prescribed, IF NEEDED.  If you are having problems/concerns with the prescription  medicine (does not control pain, nausea, vomiting, rash, itching, etc), please call us [phone] see if we need to switch you to a different pain medicine that will work better for you and/or control your side effect better. If you need a refill on your pain medication, please give us [place] hour notice.  contact your pharmacy.  They will contact our office to request authorization. Prescriptions will not be filled after 5 pm or on week-ends  Avoid getting constipated.   Between the surgery and the pain medications, it is common to experience some constipation.   Increasing fluid intake and taking a fiber supplement (such as Metamucil, Citrucel, FiberCon, MiraLax, etc) 1-2 times a day regularly will usually help prevent this problem from occurring.   A mild laxative (prune juice, Milk of Magnesia, MiraLax, etc) should be taken according to package directions if there are no bowel movements after 48 hours.   Watch out for diarrhea.   If you have many loose bowel movements, simplify your diet to bland foods & liquids for a few days.   Stop any stool softeners and decrease your fiber supplement.   Switching to mild anti-diarrheal medications (Kayopectate, Pepto Bismol) can help.   If this worsens or does not improve, please call us.[place] / shower every day.  You may shower over the steri strips which are waterproof.  No rubbing, scrubbing, lotions or ointments to incisions.  Do not soak or submerge incisions.  Okay to remove the Band-Aids when you get home.  The Steri-Strips (white tapes underneath) should stay in place  for about a week and then they will begin to peel off on their own.  You may leave the incision open to air.  You may replace a dressing/Band-Aid to cover the incision for comfort if you wish.   ACTIVITIES as tolerated:   You may resume regular (light) daily activities beginning the next day--such as daily self-care, walking, climbing stairs--gradually increasing activities as  tolerated.  If you can walk 30 minutes without difficulty, it is safe to try more intense activity such as jogging, treadmill, bicycling, low-impact aerobics, swimming, etc. Refrain from the most intensive and strenuous activity such as sit-ups, heavy lifting, contact sports, etc  Refrain from any heavy lifting or straining until about 6 weeks after surgery. DO NOT PUSH THROUGH PAIN.  Let pain be your guide: If it hurts to do something, don't do it.  Pain is your body warning you to avoid that activity for another week until the pain goes down. You may drive when you are no longer taking prescription pain medication, you can comfortably wear a seatbelt, and you can safely maneuver your car and apply brakes. You may have sexual intercourse when it is comfortable.  FOLLOW UP in our office Please call [facility] at [phone] to set up an appointment to see your surgeon in the office for a follow-up appointment approximately 3 weeks after your surgery. Make sure that you call for this appointment the day you arrive home to insure a convenient appointment time.  10. IF YOU HAVE DISABILITY OR FAMILY LEAVE FORMS, BRING THEM TO THE OFFICE FOR PROCESSING.  DO NOT GIVE THEM TO YOUR DOCTOR.   WHEN TO CALL us [phone]: Poor pain control Reactions / problems with new medications (rash/itching, nausea, etc)  Fever over 101.5 F (38.5 C) Inability to urinate Nausea and/or vomiting Worsening swelling or bruising Continued bleeding from incision. Increased pain, redness, or drainage from the incision   The clinic staff is available to answer your questions during regular business hours (8:30am-5pm).  Please don't hesitate to call and ask to speak to one of our nurses for clinical concerns.   If you have a medical emergency, go to the nearest emergency room or call 911.  A surgeon from [facility] is always on call at the [facility], [place],  [place], [place], [place]  [address] ? MAIN: [phone] ? TOLL FREE: [phone] ?  FAX [phone] [email or web address]  EATING AFTER YOUR HIATAL HERNIA REPAIR  After your esophageal surgery, expect some sticking with swallowing over the next 1-2 months.    If food sticks when you eat, it is called "dysphagia".  This is due to swelling around your esophagus at the wrap & hiatal diaphragm repair.  It will gradually ease off over the next few months.  To help you through this temporary phase, we start you out on a pureed (blenderized) diet.  Your first meal in the hospital was thin liquids.  You should have been given a pureed diet by the time you left the hospital.  We ask patients to stay on a pureed diet for the first 2-3 weeks to avoid anything getting "stuck" near your recent surgery.  Don't be alarmed if your ability to swallow doesn't progress according to this plan.  Everyone is different and some diets can advance more or less quickly.    It is often helpful to crush your medications or split them as they can sometimes stick, especially  the first week or so.   Some BASIC RULES to follow are: Maintain an upright position whenever eating or drinking. Take small bites - just a teaspoon size bite at a time. Eat slowly.  It may also help to eat only one food at a time. Consider nibbling through smaller, more frequent meals & avoid the urge to eat BIG meals Do not push through feelings of fullness, nausea, or bloatedness Do not mix solid foods and liquids in the same mouthful Try not to "wash foods down" with large gulps of liquids. Avoid carbonated (bubbly/fizzy) drinks.   Avoid foods that make you feel gassy or bloated.  Start with bland foods first.  Wait on trying greasy, fried, or spicy meals until you are tolerating more bland solids well. Understand that it will be hard to burp and belch at first.  This gradually improves with time.  Expect to be more  gassy/flatulent/bloated initially.  Walking will help your body manage it better. Consider using medications for bloating that contain simethicone such as  Maalox or Gas-X  Consider crushing her medications, especially smaller pills.  The ability to swallow pills should get easier after a few weeks Eat in a relaxed atmosphere & minimize distractions. Avoid talking while eating.   Do not use straws. Following each meal, sit in an upright position (90 degree angle) for 60 to 90 minutes.  Going for a short walk can help as well If food does stick, don't panic.  Try to relax and let the food pass on its own.  Sipping WARM LIQUID such as strong hot black tea can also help slide it down.   Be gradual in changes & use common sense:  -If you easily tolerating a certain "level" of foods, advance to the next level gradually -If you are having trouble swallowing a particular food, then avoid it.   -If food is sticking when you advance your diet, go back to thinner previous diet (the lower LEVEL) for 1-2 days.  LEVEL 1 = PUREED DIET  Do for the first 2 WEEKS AFTER SURGERY  -Foods in this group are pureed or blenderized to a smooth, mashed potato-like consistency.  -If necessary, the pureed foods can keep their shape with the addition of a thickening agent.   -Meat should be pureed to a smooth, pasty consistency.  Hot broth or gravy may be added to the pureed meat, approximately 1 oz. of liquid per 3 oz. serving of meat. -CAUTION:  If any foods do not puree into a smooth consistency, swallowing will be more difficult.  (For example, nuts or seeds sometimes do not blend well.)  Hot Foods Cold Foods  Pureed scrambled eggs and cheese Pureed cottage cheese  Baby cereals Thickened juices and nectars  Thinned cooked cereals (no lumps) Thickened milk or eggnog  Pureed [place] toast or pancakes Ensure  Mashed potatoes Ice cream  Pureed parsley, au gratin, scalloped potatoes, candied sweet potatoes Fruit or  [place] ice, sherbet  Pureed buttered or alfredo noodles Plain yogurt  Pureed vegetables (no corn or peas) Instant breakfast  Pureed soups and creamed soups Smooth pudding, mousse, custard  Pureed scalloped apples Whipped gelatin  Gravies Sugar, syrup, honey, jelly  Sauces, cheese, tomato, barbecue, white, creamed Cream  Any baby food Creamer  Alcohol in moderation (not beer or champagne) Margarine  Coffee or tea Mayonnaise   Ketchup, mustard   Apple sauce   SAMPLE MENU:  PUREED DIET Breakfast Lunch Dinner  Orange juice, 1/2 cup Cream  of wheat, 1/2 cup Pineapple juice, 1/2 cup Pureed [place], barley soup, 3/4 cup Pureed Hawaiian chicken, 3 oz  Scrambled eggs, mashed or blended with cheese, 1/2 cup Tea or coffee, 1 cup  Whole milk, 1 cup  Non-dairy creamer, 2 Tbsp. Mashed potatoes, 1/2 cup Pureed cooled broccoli, 1/2 cup Apple sauce, 1/2 cup Coffee or tea Mashed potatoes, 1/2 cup Pureed spinach, 1/2 cup Frozen yogurt, 1/2 cup Tea or coffee      LEVEL 2 = SOFT DIET  After your first 2 weeks, you can advance to a soft diet.   Keep on this diet until everything goes down easily.  Hot Foods Cold Foods  White fish Cottage cheese  Stuffed fish Junior baby fruit  Baby food meals Semi thickened juices  Minced soft cooked, scrambled, poached eggs nectars  Souffle & omelets Ripe mashed bananas  Cooked cereals Canned fruit, pineapple sauce, milk  potatoes Milkshake  Buttered or Alfredo noodles Custard  Cooked cooled vegetable Puddings, including tapioca  Sherbet Yogurt  Vegetable soup or alphabet soup Fruit ice, [place] ice  Gravies Whipped gelatin  Sugar, syrup, honey, jelly Junior baby desserts  Sauces:  Cheese, creamed, barbecue, tomato, white Cream  Coffee or tea Margarine   SAMPLE MENU:  LEVEL 2 Breakfast Lunch Dinner  Orange juice, 1/2 cup Oatmeal, 1/2 cup Scrambled eggs with cheese, 1/2 cup Decaffeinated tea, 1 cup Whole milk, 1 cup Non-dairy creamer, 2 Tbsp  Pineapple juice, 1/2 cup Minced beef, 3 oz Gravy, 2 Tbsp Mashed potatoes, 1/2 cup Minced fresh broccoli, 1/2 cup Applesauce, 1/2 cup Coffee, 1 cup [place], barley soup, 3/4 cup Minced Hawaiian chicken, 3 oz Mashed potatoes, 1/2 cup Cooked spinach, 1/2 cup Frozen yogurt, 1/2 cup Non-dairy creamer, 2 Tbsp      LEVEL 3 = CHOPPED DIET  -After all the foods in level 2 (soft diet) are passing through well you should advance up to more chopped foods.  -It is still important to cut these foods into small pieces and eat slowly.  Hot Foods Cold Foods  Poultry Cottage cheese  Chopped Swedish meatballs Yogurt  Meat salads (ground or flaked meat) Milk  Flaked fish (tuna) Milkshakes  Poached or scrambled eggs Soft, cold, dry cereal  Souffles and omelets Fruit juices or nectars  Cooked cereals Chopped canned fruit  Chopped [place] toast or pancakes Canned fruit cocktail  Noodles or pasta (no rice) Pudding, mousse, custard  Cooked vegetables (no frozen peas, corn, or mixed vegetables) Green salad  Canned small sweet peas Ice cream  Creamed soup or vegetable soup Fruit ice, [place] ice  Pureed vegetable soup or alphabet soup Non-dairy creamer  Ground scalloped apples Margarine  Gravies Mayonnaise  Sauces:  Cheese, creamed, barbecue, tomato, white Ketchup  Coffee or tea Mustard   SAMPLE MENU:  LEVEL 3 Breakfast Lunch Dinner  Orange juice, 1/2 cup Oatmeal, 1/2 cup Scrambled eggs with cheese, 1/2 cup Decaffeinated tea, 1 cup Whole milk, 1 cup Non-dairy creamer, 2 Tbsp Ketchup, 1 Tbsp Margarine, 1 tsp Salt, 1/4 tsp Sugar, 2 tsp Pineapple juice, 1/2 cup Ground beef, 3 oz Gravy, 2 Tbsp Mashed potatoes, 1/2 cup Cooked spinach, 1/2 cup Applesauce, 1/2 cup Decaffeinated coffee Whole milk Non-dairy creamer, 2 Tbsp Margarine, 1 tsp Salt, 1/4 tsp Pureed [place], barley soup, 3/4 cup Barbecue chicken, 3 oz Mashed potatoes, 1/2 cup Ground fresh broccoli, 1/2 cup Frozen yogurt, 1/2  cup Decaffeinated tea, 1 cup Non-dairy creamer, 2 Tbsp Margarine, 1 tsp Salt, 1/4 tsp Sugar, 1 tsp  LEVEL 4:  REGULAR FOODS  -Foods in this group are soft, moist, regularly textured foods.   -This level includes meat and breads, which tend to be the hardest things to swallow.   -Eat very slowly, chew well and continue to avoid carbonated drinks. -most people are at this level in 4-6 weeks  Hot Foods Cold Foods  Baked fish or skinned Soft cheeses - cottage cheese  Souffles and omelets Cream cheese  Eggs Yogurt  Stuffed shells Milk  Spaghetti with meat sauce Milkshakes  Cooked cereal Cold dry cereals (no nuts, dried fruit, coconut)  [place] toast or pancakes Crackers  Buttered toast Fruit juices or nectars  Noodles or pasta (no rice) Canned fruit  Potatoes (all types) Ripe bananas  Soft, cooked vegetables (no corn, lima, or baked beans) Peeled, ripe, fresh fruit  Creamed soups or vegetable soup Cakes (no nuts, dried fruit, coconut)  Canned chicken noodle soup Plain doughnuts  Gravies Ice cream  Bacon dressing Pudding, mousse, custard  Sauces:  Cheese, creamed, barbecue, tomato, white Fruit ice, [place] ice, sherbet  Decaffeinated tea or coffee Whipped gelatin  Pork chops Regular gelatin   Canned fruited gelatin molds   Sugar, syrup, honey, jam, jelly   Cream   Non-dairy   Margarine   Oil   Mayonnaise   Ketchup   Mustard   TROUBLESHOOTING IRREGULAR BOWELS  1) Avoid extremes of bowel movements (no bad constipation/diarrhea)  2) Miralax 17gm mixed in 8oz. water or juice-daily. May use BID as needed.  3) Gas-x,Phazyme, etc. as needed for gas & bloating.  4) Soft,bland diet. No spicy,greasy,fried foods.  5) Prilosec over-the-counter as needed  6) May hold gluten/wheat products from diet to see if symptoms improve.  7) May try probiotics (Align, Activa, etc) to help calm the bowels down  7) If symptoms become worse call back immediately.    If you have any questions  please call our office at [place]: [phone].

## 2021-12-04 NOTE — Discharge Summary (Signed)
Physician Discharge Summary  Patient ID: Rhonda Harris MRN: [id number] DOB/AGE: March 02, 1961 61 y.o.  Admit date: 12/03/2021 Discharge date: 12/04/2021  Admission Diagnoses: Giant type III paraesophageal hernia  Discharge Diagnoses:  Principal Problem:   S/P repair of paraesophageal hernia   Discharged Condition: good  Hospital Course: She was admitted for routine postoperative support following robotic repair of large type III paraesophageal hernia with toupeT fundoplication.  She was able to tolerate clear liquids the evening of postop day 0.  No dysphagia, nausea, or significant pain.  Vital signs were normal as were labs.  Postoperative upper GI was completed on postop day 1.  Diet was advanced to pure.  She was able to be discharged on postop day 1   Discharge Exam: Blood pressure 112/72, pulse 70, temperature 98.3 F (36.8 C), temperature source Oral, resp. rate 18, height 5' 6"  (1.676 m), weight 108.1 kg, SpO2 93 %. Alert, well-appearing Unlabored respirations Abdomen is soft, nontender, nondistended.  Incisions clean dry intact with no significant ecchymosis, hematoma, cellulitis or evidence of hernia.  Disposition: Discharge disposition: 01-Home or Self Care      Allergies as of 12/04/2021   No Known Allergies      Medication List     STOP taking these medications    mupirocin ointment 2 % Commonly known as: BACTROBAN       TAKE these medications    CALCIUM + VITAMIN D3 PO Take 1 tablet by mouth daily.   calcium carbonate 600 MG Tabs tablet Commonly known as: OS-CAL Take 600 mg by mouth daily.   docusate sodium 100 MG capsule Commonly known as: COLACE Take 1 capsule (100 mg total) by mouth 2 (two) times daily.   ferrous sulfate 325 (65 FE) MG tablet Take 325 mg by mouth daily with breakfast.   gabapentin 300 MG capsule Commonly known as: NEURONTIN Take 1 capsule (300 mg total) by mouth 2 (two) times daily for 5 days.   hydroxychloroquine 200  MG tablet Commonly known as: PLAQUENIL Take 200 mg by mouth 2 (two) times daily.   multivitamin tablet Take 2 tablets by mouth daily.   nabumetone 500 MG tablet Commonly known as: RELAFEN Take 500 mg by mouth 2 (two) times daily.   ondansetron 4 MG tablet Commonly known as: Zofran Take 1 tablet (4 mg total) by mouth every 8 (eight) hours as needed for nausea or vomiting.   pantoprazole 40 MG tablet Commonly known as: PROTONIX TAKE ONE TABLET (40 MG TOTAL) BY MOUTH ONE TO TWO TIMES DAILY BEFORE A MEAL FORREFLUX. What changed:  how much to take how to take this when to take this reasons to take this additional instructions   propranolol 20 MG tablet Commonly known as: INDERAL Take 1 tablet (20 mg total) by mouth 2 (two) times daily.   SUMAtriptan 50 MG tablet Commonly known as: IMITREX TAKE ONE TABLET BY MOUTH AS NEEDED, MAY REPEAT IN TWO HOURS IF HEADACHE PERSISTSOR RECURS   traMADol 50 MG tablet Commonly known as: ULTRAM Take 1 tablet (50 mg total) by mouth every 6 (six) hours as needed for up to 5 days for moderate pain (pain not relieved by tylenol, ibuprofen, rest or ice).        Follow-up Information     Clovis Riley, MD Follow up in 3 week(s).   Specialty: General Surgery Contact information: [address] [place] [address] [phone]  Signed: Clovis Riley 12/04/2021, 7:47 AM

## 2022-02-27 ENCOUNTER — Ambulatory Visit: Payer: Self-pay | Admitting: Family Medicine

## 2022-02-27 ENCOUNTER — Ambulatory Visit: Payer: No Typology Code available for payment source | Admitting: Family Medicine

## 2022-04-01 ENCOUNTER — Other Ambulatory Visit: Payer: Self-pay | Admitting: Family Medicine

## 2022-05-28 ENCOUNTER — Ambulatory Visit: Payer: No Typology Code available for payment source | Admitting: Family Medicine

## 2022-05-28 VITALS — BP 128/82 | HR 65 | Temp 98.2°F | Ht 66.0 in | Wt 219.0 lb

## 2022-05-28 DIAGNOSIS — G43709 Chronic migraine without aura, not intractable, without status migrainosus: Secondary | ICD-10-CM | POA: Diagnosis not present

## 2022-05-28 DIAGNOSIS — L989 Disorder of the skin and subcutaneous tissue, unspecified: Secondary | ICD-10-CM | POA: Insufficient documentation

## 2022-05-28 MED ORDER — SUMATRIPTAN SUCCINATE 50 MG PO TABS: ORAL_TABLET | ORAL | 6 refills | Status: DC

## 2022-05-28 NOTE — Progress Notes (Signed)
Subjective:  Patient ID: Rhonda Harris, female    DOB: 03-Nov-1960  Age: 62 y.o. MRN: [id number]  CC: Chief Complaint  Patient presents with   skin lesion on left leg     HPI:  61 year old female presents for evaluation of the above.  Patient reports a new skin lesion to her left lower leg.  She states that it is painful and raised.  She states that it has been going on for the past few months.  She has had a similar lesion on this leg,. She has been seen by dermatology in the past without significant improvement in this lesion either.  No drainage from the area.  No significant redness.  Patient also reports that her migraines are stable.  She is in need of refill of her Imitrex.  Patient Active Problem List   Diagnosis Date Noted   Skin lesion of left leg 05/28/2022   Hyperlipidemia 02/20/2021   GERD (gastroesophageal reflux disease) 02/19/2021   Prediabetes 02/19/2021   Migraine 02/19/2021   Rheumatoid arthritis ([place]) 02/19/2021   IDA (iron deficiency anemia) 05/15/2018   Large hiatal hernia 01/30/2018   Osteopenia 10/31/2014   Celiac disease 08/02/2013    Social Hx   Social History   Socioeconomic History   Marital status: Married    Spouse name: Not on file   Number of children: Not on file   Years of education: Not on file   Highest education level: Not on file  Occupational History   Not on file  Tobacco Use   Smoking status: Former    Types: Cigarettes    Quit date: 07/02/1993    Years since quitting: 28.9    Passive exposure: Never   Smokeless tobacco: Never   Tobacco comments:    Smokes ciggs some days when stressed at work  Scientific laboratory technician Use: Never used  Substance and Sexual Activity   Alcohol use: Not Currently    Comment: rare social    Drug use: No   Sexual activity: Yes    Birth control/protection: Post-menopausal, Surgical  Other Topics Concern   Not on file  Social History Narrative   Not on file   Social Determinants of Health    Financial Resource Strain: Not on file  Food Insecurity: Not on file  Transportation Needs: Not on file  Physical Activity: Not on file  Stress: Not on file  Social Connections: Not on file    Review of Systems Per HPI  Objective:  BP 128/82   Pulse 65   Temp 98.2 F (36.8 C)   Ht 5\' 6"  (1.676 m)   Wt 219 lb (99.3 kg)   SpO2 98%   BMI 35.35 kg/m      05/28/2022   11:00 AM 12/04/2021    9:24 AM 12/04/2021    4:42 AM  BP/Weight  Systolic BP 824 235 361  Diastolic BP 82 77 72  Wt. (Lbs) 219    BMI 35.35 kg/m2      Physical Exam Vitals and nursing note reviewed.  Constitutional:      Appearance: Normal appearance. She is obese.  Pulmonary:     Effort: Pulmonary effort is normal. No respiratory distress.  Skin:    Comments: Left lower leg with 2 hypopigmented and slightly raised skin lesions.  Tender to palpation.  Neurological:     Mental Status: She is alert.     Lab Results  Component Value Date   WBC 7.7  12/04/2021   HGB 13.8 12/04/2021   HCT 42.1 12/04/2021   PLT 190 12/04/2021   GLUCOSE 125 (H) 12/04/2021   CHOL 224 (H) 03/23/2020   TRIG 83 03/23/2020   HDL 70 03/23/2020   LDLCALC 136 (H) 03/23/2020   ALT 24 08/06/2021   AST 28 08/06/2021   NA 140 12/04/2021   K 4.1 12/04/2021   CL 110 12/04/2021   CREATININE 0.66 12/04/2021   BUN 10 12/04/2021   CO2 24 12/04/2021   TSH 0.99 03/23/2020   INR 0.92 07/10/2013   HGBA1C 5.6 11/22/2021     Assessment & Plan:   Problem List Items Addressed This Visit       Cardiovascular and Mediastinum   Migraine    Imitrex refilled.      Relevant Medications   SUMAtriptan (IMITREX) 50 MG tablet     Musculoskeletal and Integument   Skin lesion of left leg - Primary    Needs excision.  Referring to dermatology.      Relevant Orders   Ambulatory referral to Dermatology    Meds ordered this encounter  Medications   SUMAtriptan (IMITREX) 50 MG tablet    Sig: TAKE ONE TABLET BY MOUTH AS NEEDED,  MAY REPEAT IN TWO HOURS IF HEADACHE PERSISTSOR RECURS    Dispense:  20 tablet    Refill:  [place] DO [place]

## 2022-05-28 NOTE — Assessment & Plan Note (Signed)
Needs excision.  Referring to dermatology.

## 2022-05-28 NOTE — Assessment & Plan Note (Signed)
Imitrex refilled.

## 2022-05-28 NOTE — Patient Instructions (Signed)
Referral placed.  Medication refilled.  Take care  Dr. Lacinda Axon

## 2022-09-16 ENCOUNTER — Other Ambulatory Visit: Payer: Self-pay | Admitting: Gastroenterology

## 2023-05-09 ENCOUNTER — Ambulatory Visit
Admission: EM | Admit: 2023-05-09 | Discharge: 2023-05-09 | Disposition: A | Discharge status: Home / Self Care | Payer: No Typology Code available for payment source | Attending: Family Medicine | Admitting: Family Medicine

## 2023-05-09 ENCOUNTER — Encounter: Payer: Self-pay | Admitting: Emergency Medicine

## 2023-05-09 DIAGNOSIS — R197 Diarrhea, unspecified: Secondary | ICD-10-CM | POA: Diagnosis not present

## 2023-05-09 DIAGNOSIS — R509 Fever, unspecified: Secondary | ICD-10-CM | POA: Diagnosis not present

## 2023-05-09 DIAGNOSIS — R52 Pain, unspecified: Secondary | ICD-10-CM

## 2023-05-09 LAB — POC COVID19/FLU A&B COMBO
Covid Antigen, POC: NEGATIVE
Influenza A Antigen, POC: NEGATIVE
Influenza B Antigen, POC: NEGATIVE

## 2023-05-09 MED ORDER — LOPERAMIDE HCL 2 MG PO CAPS: 2.0000 mg | ORAL_CAPSULE | Freq: Four times a day (QID) | ORAL | 0 refills | Status: AC | PRN

## 2023-05-09 MED ORDER — ONDANSETRON 4 MG PO TBDP: 4.0000 mg | ORAL_TABLET | Freq: Three times a day (TID) | ORAL | 0 refills | Status: AC | PRN

## 2023-05-09 NOTE — ED Provider Notes (Signed)
[facility]    CSN: [id number] Arrival date & time: 05/09/23  0908      History   Chief Complaint No chief complaint on file.   HPI Rhonda Harris is a 62 y.o. female.   Presenting today with 3-day history of fever, chills, body aches, fatigue, weakness, diarrhea, mild abdominal pain, mild sore throat.  Denies cough, congestion, chest pain, shortness of breath, vomiting.  So far trying Tylenol with mild temporary relief.    Past Medical History:  Diagnosis Date   Arthritis    Celiac disease 07/2013   Esophageal erosions    GERD (gastroesophageal reflux disease)    Headache    migraines   History of kidney stones     Patient Active Problem List   Diagnosis Date Noted   Skin lesion of left leg 05/28/2022   Hyperlipidemia 02/20/2021   GERD (gastroesophageal reflux disease) 02/19/2021   Prediabetes 02/19/2021   Migraine 02/19/2021   Rheumatoid arthritis ([facility]) 02/19/2021   IDA (iron deficiency anemia) 05/15/2018   Large hiatal hernia 01/30/2018   Osteopenia 10/31/2014   Celiac disease 08/02/2013    Past Surgical History:  Procedure Laterality Date   BIOPSY  02/04/2018   Procedure: BIOPSY;  Surgeon: Corbin Ade, MD;  Location: [facility];  Service: Endoscopy;;  duodenal bx's   BIOPSY  02/25/2018   Procedure: BIOPSY;  Surgeon: Corbin Ade, MD;  Location: [facility];  Service: Endoscopy;;  ileum   COLONOSCOPY N/A 02/25/2018   Dr. Jena Gauss: Diverticulosis, 5 cm of the terminal ileum appeared eroded and slightly cobblestoned.  Ileal biopsy with prominent lymphoid aggregates, negative for dysplasia.   COLONOSCOPY WITH ESOPHAGOGASTRODUODENOSCOPY (EGD) N/A 07/12/2013   Dr. Jena Gauss: EGD revealed erosive reflux esophagitis, large hiatal hernia, antral erosions, abnormal duodenum with path revealing partially developed celiac sprue. Celiac serologies normal but felt to be dealing with celiac disease. Colonoscopy with ileal erosions, negative path.     ESOPHAGOGASTRODUODENOSCOPY N/A 02/04/2018   Dr. Jena Gauss: Nonobstructing Schatzki ring status post dilation.  Large hiatal hernia.  Scattered mild mucosal changes in the second portion of the duodenum, slightly frondy but no scalloping.  Small bowel biopsy with mild hyperemia and edema, no features of sprue, active inflammation or granulomas.  Of note, patient was on a gluten-free diet for 4 years prior to this biopsy.   ESOPHAGOGASTRODUODENOSCOPY (EGD) WITH PROPOFOL N/A 09/24/2021   Procedure: ESOPHAGOGASTRODUODENOSCOPY (EGD) WITH PROPOFOL;  Surgeon: Corbin Ade, MD;  Location: [facility];  Service: Endoscopy;  Laterality: N/A;  9:30am   GIVENS CAPSULE STUDY N/A 03/23/2018   Few erosions/?  Ulcerations noted.  Abnormal villi towards distal small intestines of uncertain significance.  Prominent fold versus benign-appearing polyp with erosion noted at 2 hours 37 minutes 24 seconds.   MALONEY DILATION N/A 02/04/2018   Procedure: Elease Hashimoto DILATION;  Surgeon: Corbin Ade, MD;  Location: [facility];  Service: Endoscopy;  Laterality: N/A;   TUBAL LIGATION     WISDOM TOOTH EXTRACTION     XI ROBOTIC ASSISTED PARAESOPHAGEAL HERNIA REPAIR N/A 12/03/2021   Procedure: XI ROBOTIC ASSISTED PARAESOPHAGEAL HERNIA REPAIR WITH FUNDOPLICATION AND UPPER GI ENDOSCOPY;  Surgeon: Berna Bue, MD;  Location: [facility];  Service: General;  Laterality: N/A;    OB History   No obstetric history on file.      Home Medications    Prior to Admission medications   Medication Sig Start Date End Date Taking? Authorizing Provider  loperamide (IMODIUM) 2  MG capsule Take 1 capsule (2 mg total) by mouth 4 (four) times daily as needed for diarrhea or loose stools. 05/09/23  Yes Particia Nearing, PA-C  ondansetron (ZOFRAN-ODT) 4 MG disintegrating tablet Take 1 tablet (4 mg total) by mouth every 8 (eight) hours as needed for nausea or vomiting. 05/09/23  Yes Particia Nearing, PA-C  Calcium  Carb-Cholecalciferol (CALCIUM + VITAMIN D3 PO) Take 1 tablet by mouth daily.    [provider]  calcium carbonate (OS-CAL) 600 MG TABS tablet Take 600 mg by mouth daily.    [provider]  docusate sodium (COLACE) 100 MG capsule Take 1 capsule (100 mg total) by mouth 2 (two) times daily. 12/04/21   Berna Bue, MD  ferrous sulfate 325 (65 FE) MG tablet Take 325 mg by mouth daily with breakfast.    [provider]  gabapentin (NEURONTIN) 300 MG capsule Take 1 capsule (300 mg total) by mouth 2 (two) times daily for 5 days. 12/04/21 05/28/22  Berna Bue, MD  hydroxychloroquine (PLAQUENIL) 200 MG tablet Take 200 mg by mouth 2 (two) times daily. 08/22/20   [provider]  Multiple Vitamin (MULTIVITAMIN) tablet Take 2 tablets by mouth daily.     [provider]  nabumetone (RELAFEN) 500 MG tablet Take 500 mg by mouth 2 (two) times daily. 08/22/20   [provider]  ondansetron (ZOFRAN) 4 MG tablet Take 1 tablet (4 mg total) by mouth every 8 (eight) hours as needed for nausea or vomiting. 12/04/21   Berna Bue, MD  pantoprazole (PROTONIX) 40 MG tablet TAKE ONE TABLET (40 MG TOTAL) BY MOUTH ONE TO TWO TIMES DAILY BEFORE A MEAL FORREFLUX. 09/16/22   Tiffany Kocher, PA-C  propranolol (INDERAL) 20 MG tablet TAKE ONE TABLET (20MG  TOTAL) BY MOUTH TWO TIMES DAILY 04/01/22   Cook, Jayce G, DO  SUMAtriptan (IMITREX) 50 MG tablet TAKE ONE TABLET BY MOUTH AS NEEDED, MAY REPEAT IN TWO HOURS IF HEADACHE PERSISTSOR RECURS 05/28/22   Tommie Sams, DO    Family History Family History  Problem Relation Age of Onset   Osteoporosis Mother    Cancer Father 59       pancreatic   Colon cancer Neg Hx    Gastric cancer Neg Hx    Cancer - Other Neg Hx        small bowel    Social History Social History   Tobacco Use   Smoking status: Former    Current packs/day: 0.00    Types: Cigarettes    Quit date: 07/02/1993    Years since quitting: 29.8     Passive exposure: Never   Smokeless tobacco: Never   Tobacco comments:    Smokes ciggs some days when stressed at work  Vaping Use   Vaping status: Never Used  Substance Use Topics   Alcohol use: Not Currently    Comment: rare social    Drug use: No     Allergies   Patient has no known allergies.   Review of Systems Review of Systems Per HPI  Physical Exam Triage Vital Signs ED Triage Vitals  Encounter Vitals Group     BP 05/09/23 1026 (!) 144/88     Systolic BP Percentile --      Diastolic BP Percentile --      Pulse Rate 05/09/23 1026 (!) 103     Resp 05/09/23 1026 18     Temp 05/09/23 1026 99.5 F (37.5 C)  Temp Source 05/09/23 1026 Oral     SpO2 05/09/23 1026 95 %     Weight --      Height --      Head Circumference --      Peak Flow --      Pain Score 05/09/23 1027 10     Pain Loc --      Pain Education --      Exclude from Growth Chart --    No data found.  Updated Vital Signs BP (!) 144/88 (BP Location: Right Arm)   Pulse (!) 103   Temp 99.5 F (37.5 C) (Oral)   Resp 18   SpO2 95%   Visual Acuity Right Eye Distance:   Left Eye Distance:   Bilateral Distance:    Right Eye Near:   Left Eye Near:    Bilateral Near:     Physical Exam Vitals and nursing note reviewed.  Constitutional:      Appearance: Normal appearance. She is not ill-appearing.  HENT:     Head: Atraumatic.     Nose: Nose normal.     Mouth/Throat:     Mouth: Mucous membranes are moist.     Pharynx: Oropharynx is clear.  Eyes:     Extraocular Movements: Extraocular movements intact.     Conjunctiva/sclera: Conjunctivae normal.  Cardiovascular:     Rate and Rhythm: Normal rate and regular rhythm.     Heart sounds: Normal heart sounds.  Pulmonary:     Effort: Pulmonary effort is normal.     Breath sounds: Normal breath sounds.  Abdominal:     General: Bowel sounds are normal. There is no distension.     Palpations: Abdomen is soft.     Tenderness: There is no  abdominal tenderness. There is no right CVA tenderness, left CVA tenderness or guarding.  Musculoskeletal:        General: Normal range of motion.     Cervical back: Normal range of motion and neck supple.  Skin:    General: Skin is warm and dry.  Neurological:     Mental Status: She is alert and oriented to person, place, and time.  Psychiatric:        Mood and Affect: Mood normal.        Thought Content: Thought content normal.        Judgment: Judgment normal.      UC Treatments / Results  Labs (all labs ordered are listed, but only abnormal results are displayed) Labs Reviewed  POC COVID19/FLU A&B COMBO    EKG   Radiology No results found.  Procedures Procedures (including critical care time)  Medications Ordered in UC Medications - No data to display  Initial Impression / Assessment and Plan / UC Course  I have reviewed the triage vital signs and the nursing notes.  Pertinent labs & imaging results that were available during my care of the patient were reviewed by me and considered in my medical decision making (see chart for details).     Minimally hypertensive and tachycardic in triage and otherwise vital signs reassuring.  Suspect viral illness.  COVID and flu negative.  Exam reassuring with no red flag findings today.  Will treat symptoms with Imodium, Zofran, Tylenol, supportive over-the-counter medications and home care.  Return for worsening symptoms.  Final Clinical Impressions(s) / UC Diagnoses   Final diagnoses:  Fever, unspecified  Body aches  Diarrhea, unspecified type     Discharge Instructions  Take Tylenol as needed for fever, body aches, Zofran and Imodium sent for nausea and diarrhea, make sure to stay well-hydrated and eat bland foods until feeling better.  Follow-up for significantly worsening symptoms.    ED Prescriptions     Medication Sig Dispense Auth. Provider   loperamide (IMODIUM) 2 MG capsule Take 1 capsule (2 mg  total) by mouth 4 (four) times daily as needed for diarrhea or loose stools. 12 capsule Particia Nearing, PA-C   ondansetron (ZOFRAN-ODT) 4 MG disintegrating tablet Take 1 tablet (4 mg total) by mouth every 8 (eight) hours as needed for nausea or vomiting. 20 tablet Particia Nearing, [place]      PDMP not reviewed this encounter.   Particia Nearing, [place] 05/09/23 1139

## 2023-05-09 NOTE — ED Triage Notes (Signed)
Diarrhea and body aches on Christmas eve.  States chills and fever.  Has been taking tylenol cold without relief.

## 2023-05-09 NOTE — Discharge Instructions (Signed)
Take Tylenol as needed for fever, body aches, Zofran and Imodium sent for nausea and diarrhea, make sure to stay well-hydrated and eat bland foods until feeling better.  Follow-up for significantly worsening symptoms.

## 2023-05-13 ENCOUNTER — Ambulatory Visit: Payer: No Typology Code available for payment source | Admitting: Family Medicine

## 2023-05-13 VITALS — BP 122/84 | HR 80 | Temp 98.1°F | Ht 66.0 in | Wt 216.0 lb

## 2023-05-13 DIAGNOSIS — K529 Noninfective gastroenteritis and colitis, unspecified: Secondary | ICD-10-CM

## 2023-05-13 DIAGNOSIS — R7309 Other abnormal glucose: Secondary | ICD-10-CM | POA: Diagnosis not present

## 2023-05-13 DIAGNOSIS — D509 Iron deficiency anemia, unspecified: Secondary | ICD-10-CM | POA: Diagnosis not present

## 2023-05-13 DIAGNOSIS — N390 Urinary tract infection, site not specified: Secondary | ICD-10-CM

## 2023-05-13 DIAGNOSIS — R300 Dysuria: Secondary | ICD-10-CM

## 2023-05-13 LAB — POCT URINALYSIS DIP (CLINITEK)
Bilirubin, UA: NEGATIVE
Glucose, UA: NEGATIVE mg/dL
Ketones, POC UA: NEGATIVE mg/dL
Nitrite, UA: POSITIVE — AB
POC PROTEIN,UA: 100 — AB
Spec Grav, UA: 1.02 (ref 1.010–1.025)
Urobilinogen, UA: 0.2 U/dL
pH, UA: 6.5 (ref 5.0–8.0)

## 2023-05-13 MED ORDER — CIPROFLOXACIN HCL 500 MG PO TABS: 500.0000 mg | ORAL_TABLET | Freq: Two times a day (BID) | ORAL | 0 refills | Status: AC

## 2023-05-13 NOTE — Patient Instructions (Signed)
Labs today.  Stool sample when you can.  Follow up in 2 weeks.  No gluten.

## 2023-05-14 LAB — URINE CULTURE

## 2023-05-15 DIAGNOSIS — N390 Urinary tract infection, site not specified: Secondary | ICD-10-CM | POA: Insufficient documentation

## 2023-05-15 DIAGNOSIS — K529 Noninfective gastroenteritis and colitis, unspecified: Secondary | ICD-10-CM | POA: Insufficient documentation

## 2023-05-15 NOTE — Progress Notes (Signed)
 Subjective:  Patient ID: Rhonda Harris, female    DOB: 1961-03-29  Age: 63 y.o. MRN: [id number]  CC:   Chief Complaint  Patient presents with   UC follow up    Headache, diarrhea and hx of hiatial hernia surgery     HPI:  63 year old female presents for evaluation of multiple concerns.  Patient states that she has not been feeling well since Christmas.  She reports that she was seen in urgent care on Friday.  She has been experiencing bodyaches, headaches, and back pain.  She reports fever.  Flu and COVID testing was negative.  She was treated supportively.  Patient reports that she continues to feel poorly.  Reports body aches, headaches, back pain.  She states that she has been having ongoing urinary odor without any other urinary symptoms.  This has been going on for months.  Seems to have worsened recently.  Patient also reports ongoing diarrhea which she has had for 6 months.  She states she thought this was related to her prior hiatal hernia repair.  Patient has a history of celiac disease and has been eating gluten.  She states that she was told by GI that she no longer had celiac.  Patient Active Problem List   Diagnosis Date Noted   Urinary tract infection without hematuria 05/15/2023   Chronic diarrhea 05/15/2023   Skin lesion of left leg 05/28/2022   Hyperlipidemia 02/20/2021   GERD (gastroesophageal reflux disease) 02/19/2021   Prediabetes 02/19/2021   Migraine 02/19/2021   Rheumatoid arthritis ([facility]) 02/19/2021   IDA (iron deficiency anemia) 05/15/2018   Large hiatal hernia 01/30/2018   Osteopenia 10/31/2014   Celiac disease 08/02/2013    Social Hx   Social History   Socioeconomic History   Marital status: Married    Spouse name: Not on file   Number of children: Not on file   Years of education: Not on file   Highest education level: Not on file  Occupational History   Not on file  Tobacco Use   Smoking status: Former    Current packs/day: 0.00     Types: Cigarettes    Quit date: 07/02/1993    Years since quitting: 29.8    Passive exposure: Never   Smokeless tobacco: Never   Tobacco comments:    Smokes ciggs some days when stressed at work  Psychologist, Educational Use   Vaping status: Never Used  Substance and Sexual Activity   Alcohol use: Not Currently    Comment: rare social    Drug use: No   Sexual activity: Yes    Birth control/protection: Post-menopausal, Surgical  Other Topics Concern   Not on file  Social History Narrative   Not on file   Social Drivers of Health   Financial Resource Strain: Not on file  Food Insecurity: Not on file  Transportation Needs: Not on file  Physical Activity: Not on file  Stress: Not on file  Social Connections: Not on file    Review of Systems Per HPI  Objective:  BP 122/84   Pulse 80   Temp 98.1 F (36.7 C)   Ht 5' 6 (1.676 m)   Wt 216 lb (98 kg)   SpO2 99%   BMI 34.86 kg/m      05/13/2023   11:31 AM 05/09/2023   10:26 AM 05/28/2022   11:00 AM  BP/Weight  Systolic BP 122 144 128  Diastolic BP 84 88 82  Wt. (Lbs) 216  219  BMI  34.86 kg/m2  35.35 kg/m2    Physical Exam Vitals and nursing note reviewed.  Constitutional:      General: She is not in acute distress.    Appearance: Normal appearance.  HENT:     Head: Normocephalic and atraumatic.  Cardiovascular:     Rate and Rhythm: Normal rate and regular rhythm.  Pulmonary:     Effort: Pulmonary effort is normal.     Breath sounds: Normal breath sounds. No wheezing, rhonchi or rales.  Abdominal:     General: There is no distension.     Palpations: Abdomen is soft.     Tenderness: There is no abdominal tenderness.  Neurological:     Mental Status: She is alert.     Lab Results  Component Value Date   WBC 7.9 05/13/2023   HGB 14.4 05/13/2023   HCT 42.5 05/13/2023   PLT 246 05/13/2023   GLUCOSE 87 05/13/2023   CHOL 224 (H) 03/23/2020   TRIG 83 03/23/2020   HDL 70 03/23/2020   LDLCALC 136 (H) 03/23/2020   ALT 38  (H) 05/13/2023   AST 37 (H) 05/13/2023   NA 137 05/13/2023   K 3.4 (L) 05/13/2023   CL 95 (L) 05/13/2023   CREATININE 0.93 05/13/2023   BUN 14 05/13/2023   CO2 27 05/13/2023   TSH 0.99 03/23/2020   INR 0.92 07/10/2013   HGBA1C 5.9 (H) 05/13/2023     Assessment & Plan:   Problem List Items Addressed This Visit       Digestive   Chronic diarrhea   Obtaining GI profile for further evaluation.  Advised to avoid gluten.      Relevant Orders   CMP14+EGFR   Lipase   COMPLETE METABOLIC PANEL WITH eGFR (Completed)   Lipase (Completed)   GI pathogen panel by PCR, stool     Genitourinary   Urinary tract infection without hematuria   UA consistent with UTI.  Placing on Cipro  given ongoing systemic symptoms cover for possible pyelonephritis.  Awaiting culture.      Relevant Orders   POCT URINALYSIS DIP (CLINITEK) (Completed)   Urine Culture (Completed)   Urine Culture   Urine Culture     Other   IDA (iron deficiency anemia)   Relevant Orders   CBC   Iron, TIBC and Ferritin Panel   CBC with Differential (Completed)   Iron, TIBC and Ferritin Panel (Completed)   Lipase (Completed)   Other Visit Diagnoses       Elevated glucose       Relevant Orders   Hemoglobin A1c   COMPLETE METABOLIC PANEL WITH eGFR (Completed)   Lipase (Completed)   Hemoglobin A1c (Completed)       Meds ordered this encounter  Medications   ciprofloxacin  (CIPRO ) 500 MG tablet    Sig: Take 1 tablet (500 mg total) by mouth 2 (two) times daily.    Dispense:  14 tablet    Refill:  0    Follow-up:  Return in about 2 weeks (around 05/27/2023).  Jacqulyn Ahle DO [facility]

## 2023-05-15 NOTE — Assessment & Plan Note (Signed)
 Obtaining GI profile for further evaluation.  Advised to avoid gluten.

## 2023-05-15 NOTE — Assessment & Plan Note (Signed)
 UA consistent with UTI.  Placing on Cipro given ongoing systemic symptoms cover for possible pyelonephritis.  Awaiting culture.

## 2023-05-17 LAB — COMPLETE METABOLIC PANEL WITH GFR
AG Ratio: 1 (calc) (ref 1.0–2.5)
ALT: 38 U/L — ABNORMAL HIGH (ref 6–29)
AST: 37 U/L — ABNORMAL HIGH (ref 10–35)
Albumin: 3.5 g/dL — ABNORMAL LOW (ref 3.6–5.1)
Alkaline phosphatase (APISO): 131 U/L (ref 37–153)
BUN: 14 mg/dL (ref 7–25)
CO2: 27 mmol/L (ref 20–32)
Calcium: 9.1 mg/dL (ref 8.6–10.4)
Chloride: 95 mmol/L — ABNORMAL LOW (ref 98–110)
Creat: 0.93 mg/dL (ref 0.50–1.05)
Globulin: 3.5 g/dL (ref 1.9–3.7)
Glucose, Bld: 87 mg/dL (ref 65–99)
Potassium: 3.4 mmol/L — ABNORMAL LOW (ref 3.5–5.3)
Sodium: 137 mmol/L (ref 135–146)
Total Bilirubin: 0.7 mg/dL (ref 0.2–1.2)
Total Protein: 7 g/dL (ref 6.1–8.1)
eGFR: 70 mL/min/{1.73_m2} (ref >=60)

## 2023-05-17 LAB — HEMOGLOBIN A1C
Hgb A1c MFr Bld: 5.9 %{Hb} — ABNORMAL HIGH (ref <5.7)
Mean Plasma Glucose: 123 mg/dL
eAG (mmol/L): 6.8 mmol/L

## 2023-05-17 LAB — IRON,TIBC AND FERRITIN PANEL
%SAT: 12 % — ABNORMAL LOW (ref 16–45)
Ferritin: 717 ng/mL — ABNORMAL HIGH (ref 16–288)
Iron: 29 ug/dL — ABNORMAL LOW (ref 45–160)
TIBC: 250 ug/dL (ref 250–450)

## 2023-05-17 LAB — URINE CULTURE
MICRO NUMBER:: 15909478
SPECIMEN QUALITY:: ADEQUATE

## 2023-05-17 LAB — CBC WITH DIFFERENTIAL/PLATELET
Absolute Lymphocytes: 1469 {cells}/uL (ref 850–3900)
Absolute Monocytes: 909 {cells}/uL (ref 200–950)
Basophils Absolute: 24 {cells}/uL (ref 0–200)
Basophils Relative: 0.3 %
Eosinophils Absolute: 40 {cells}/uL (ref 15–500)
Eosinophils Relative: 0.5 %
HCT: 42.5 % (ref 35.0–45.0)
Hemoglobin: 14.4 g/dL (ref 11.7–15.5)
MCH: 29.8 pg (ref 27.0–33.0)
MCHC: 33.9 g/dL (ref 32.0–36.0)
MCV: 88 fL (ref 80.0–100.0)
MPV: 12 fL (ref 7.5–12.5)
Monocytes Relative: 11.5 %
Neutro Abs: 5459 {cells}/uL (ref 1500–7800)
Neutrophils Relative %: 69.1 %
Platelets: 246 10*3/uL (ref 140–400)
RBC: 4.83 10*6/uL (ref 3.80–5.10)
RDW: 11.7 % (ref 11.0–15.0)
Total Lymphocyte: 18.6 %
WBC: 7.9 10*3/uL (ref 3.8–10.8)

## 2023-05-17 LAB — LIPASE: Lipase: 15 U/L (ref 7–60)

## 2023-05-20 ENCOUNTER — Telehealth: Payer: Self-pay | Admitting: *Deleted

## 2023-05-20 NOTE — Telephone Encounter (Signed)
 [facility] called and stated patient's insurance required a prior authorization for her labwork- they stated they could attempt this process on behalf of the office but will need a for signed by dr giving them permission to act on our behalf. Not sure if will be able to complete the PA since the labs are already complete

## 2023-05-20 NOTE — Telephone Encounter (Signed)
 Copied from CRM [id number]. Topic: Clinical - Lab/Test Results >> May 20, 2023  2:16 PM Carlatta H wrote: Reason for CRM: Please call Client at [facility] [phone] about prior authorization for labs with quest for the patient

## 2023-05-21 LAB — GASTROINTESTINAL PATHOGEN PNL
CampyloBacter Group: NOT DETECTED
Norovirus GI/GII: NOT DETECTED
Rotavirus A: NOT DETECTED
Salmonella species: NOT DETECTED
Shiga Toxin 1: NOT DETECTED
Shiga Toxin 2: NOT DETECTED
Shigella Species: NOT DETECTED
Vibrio Group: NOT DETECTED
Yersinia enterocolitica: NOT DETECTED

## 2023-05-22 ENCOUNTER — Other Ambulatory Visit: Payer: Self-pay

## 2023-05-22 DIAGNOSIS — E876 Hypokalemia: Secondary | ICD-10-CM

## 2023-05-22 DIAGNOSIS — D509 Iron deficiency anemia, unspecified: Secondary | ICD-10-CM

## 2023-05-29 ENCOUNTER — Other Ambulatory Visit: Payer: Self-pay | Admitting: Family Medicine

## 2023-06-09 ENCOUNTER — Ambulatory Visit: Payer: No Typology Code available for payment source | Admitting: Family Medicine

## 2024-05-07 ENCOUNTER — Other Ambulatory Visit: Payer: Self-pay | Admitting: Family Medicine
